# Patient Record
Sex: Male | Born: 1945 | Race: White | Hispanic: No | Marital: Single | Smoking: Never smoker
Health system: Southern US, Community
[De-identification: ages and names within clinical notes are randomized; demographics above are authoritative.]

## PROBLEM LIST (undated history)

## (undated) DIAGNOSIS — F209 Schizophrenia, unspecified: Secondary | ICD-10-CM

## (undated) DIAGNOSIS — I10 Essential (primary) hypertension: Secondary | ICD-10-CM

## (undated) DIAGNOSIS — H269 Unspecified cataract: Secondary | ICD-10-CM

## (undated) DIAGNOSIS — N400 Enlarged prostate without lower urinary tract symptoms: Secondary | ICD-10-CM

## (undated) DIAGNOSIS — E079 Disorder of thyroid, unspecified: Secondary | ICD-10-CM

## (undated) DIAGNOSIS — R32 Unspecified urinary incontinence: Secondary | ICD-10-CM

## (undated) DIAGNOSIS — G1221 Amyotrophic lateral sclerosis: Principal | ICD-10-CM

## (undated) DIAGNOSIS — D649 Anemia, unspecified: Secondary | ICD-10-CM

## (undated) DIAGNOSIS — F79 Unspecified intellectual disabilities: Secondary | ICD-10-CM

## (undated) HISTORY — PX: PROSTATE SURGERY: SHX751

## (undated) HISTORY — DX: Unspecified urinary incontinence: R32

## (undated) HISTORY — DX: Anemia, unspecified: D64.9

## (undated) HISTORY — DX: Unspecified cataract: H26.9

## (undated) HISTORY — DX: Amyotrophic lateral sclerosis: G12.21

---

## 2011-08-09 DIAGNOSIS — I10 Essential (primary) hypertension: Secondary | ICD-10-CM | POA: Diagnosis not present

## 2011-08-09 DIAGNOSIS — E119 Type 2 diabetes mellitus without complications: Secondary | ICD-10-CM | POA: Diagnosis not present

## 2011-10-07 DIAGNOSIS — H259 Unspecified age-related cataract: Secondary | ICD-10-CM | POA: Diagnosis not present

## 2011-10-07 DIAGNOSIS — E119 Type 2 diabetes mellitus without complications: Secondary | ICD-10-CM | POA: Diagnosis not present

## 2011-11-11 DIAGNOSIS — R339 Retention of urine, unspecified: Secondary | ICD-10-CM | POA: Diagnosis not present

## 2011-11-11 DIAGNOSIS — E785 Hyperlipidemia, unspecified: Secondary | ICD-10-CM | POA: Diagnosis not present

## 2011-11-11 DIAGNOSIS — I10 Essential (primary) hypertension: Secondary | ICD-10-CM | POA: Diagnosis not present

## 2011-11-11 DIAGNOSIS — F79 Unspecified intellectual disabilities: Secondary | ICD-10-CM | POA: Diagnosis not present

## 2011-11-11 DIAGNOSIS — E119 Type 2 diabetes mellitus without complications: Secondary | ICD-10-CM | POA: Diagnosis not present

## 2011-11-11 DIAGNOSIS — N390 Urinary tract infection, site not specified: Secondary | ICD-10-CM | POA: Diagnosis not present

## 2011-12-08 DIAGNOSIS — L218 Other seborrheic dermatitis: Secondary | ICD-10-CM | POA: Diagnosis not present

## 2011-12-08 DIAGNOSIS — F060 Psychotic disorder with hallucinations due to known physiological condition: Secondary | ICD-10-CM | POA: Diagnosis not present

## 2012-02-09 DIAGNOSIS — I10 Essential (primary) hypertension: Secondary | ICD-10-CM | POA: Diagnosis not present

## 2012-02-09 DIAGNOSIS — N401 Enlarged prostate with lower urinary tract symptoms: Secondary | ICD-10-CM | POA: Diagnosis not present

## 2012-02-09 DIAGNOSIS — R339 Retention of urine, unspecified: Secondary | ICD-10-CM | POA: Diagnosis not present

## 2012-02-09 DIAGNOSIS — L218 Other seborrheic dermatitis: Secondary | ICD-10-CM | POA: Diagnosis not present

## 2012-02-09 DIAGNOSIS — E785 Hyperlipidemia, unspecified: Secondary | ICD-10-CM | POA: Diagnosis not present

## 2012-02-09 DIAGNOSIS — E119 Type 2 diabetes mellitus without complications: Secondary | ICD-10-CM | POA: Diagnosis not present

## 2012-03-09 DIAGNOSIS — F639 Impulse disorder, unspecified: Secondary | ICD-10-CM | POA: Diagnosis not present

## 2012-04-10 ENCOUNTER — Ambulatory Visit (INDEPENDENT_AMBULATORY_CARE_PROVIDER_SITE_OTHER): Payer: Medicare PPO | Admitting: Family

## 2012-04-10 ENCOUNTER — Encounter: Payer: Self-pay | Admitting: Family

## 2012-04-10 VITALS — BP 128/70 | HR 77 | Temp 98.7°F | Resp 16 | Ht 68.25 in | Wt 221.0 lb

## 2012-04-10 DIAGNOSIS — Z23 Encounter for immunization: Secondary | ICD-10-CM | POA: Diagnosis not present

## 2012-04-10 DIAGNOSIS — N400 Enlarged prostate without lower urinary tract symptoms: Secondary | ICD-10-CM | POA: Diagnosis not present

## 2012-04-10 DIAGNOSIS — L309 Dermatitis, unspecified: Secondary | ICD-10-CM

## 2012-04-10 DIAGNOSIS — L259 Unspecified contact dermatitis, unspecified cause: Secondary | ICD-10-CM

## 2012-04-10 DIAGNOSIS — F209 Schizophrenia, unspecified: Secondary | ICD-10-CM | POA: Insufficient documentation

## 2012-04-10 DIAGNOSIS — I10 Essential (primary) hypertension: Secondary | ICD-10-CM

## 2012-04-10 DIAGNOSIS — E119 Type 2 diabetes mellitus without complications: Secondary | ICD-10-CM | POA: Insufficient documentation

## 2012-04-10 DIAGNOSIS — B359 Dermatophytosis, unspecified: Secondary | ICD-10-CM | POA: Diagnosis not present

## 2012-04-10 DIAGNOSIS — E785 Hyperlipidemia, unspecified: Secondary | ICD-10-CM | POA: Insufficient documentation

## 2012-04-10 NOTE — Assessment & Plan Note (Signed)
Stable at present. Family is requesting referral to dermatology.

## 2012-04-10 NOTE — Assessment & Plan Note (Signed)
Plan flp next visit.   

## 2012-04-10 NOTE — Progress Notes (Signed)
Subjective:    Patient ID: Edward [name], male    DOB: 1945-09-24, 66 y.o.   MRN: [id number]  HPI  Mr.  Edward [name] is a 66 yr old male who presents today to establish care. Recently moved here from [place], [place]. Presents with his sister who is legal Guardian Edward [name].   His medical history is significant for the following:  1) Diabetes type 2- Per sister this has been well controlled.  He takes glyburide.  No symptomatic hypoglycemia.    2) Mild mental retardation-  3) Schizophrenia- he was following with [facility] for monitoring.  He will be meeting with [facility] for therapy.  Needs referral to Psych.  4) hyperlipidemia- diet controlled.  Not on meds.    5) HTN-  He denies swelling.    6) Eczema-dandruff type symptoms.    7) BPH-  Needs referral to Urology  Review of Systems  Constitutional:       He has had some mild weight loss.  3 pound weight loss since July.   HENT: Negative for hearing loss and congestion.   Eyes: Negative for visual disturbance.  Respiratory: Negative for cough.   Cardiovascular: Negative for leg swelling.  Gastrointestinal: Negative for nausea, vomiting, diarrhea and constipation.  Genitourinary:       Some urinary incontinence.  Since his prostate procedure  Musculoskeletal: Negative for back pain and arthralgias.  Skin: Negative for rash.  Neurological: Negative for headaches.  Hematological: Does not bruise/bleed easily.  Psychiatric/Behavioral:       Occasional impatience, no aggression    Past Medical History  Diagnosis Date  . Diabetes mellitus   . Urinary incontinence     History   Social History  . Marital Status: Single    Spouse Name: N/A    Number of Children: N/A  . Years of Education: N/A   Occupational History  . Not on file.   Social History Main Topics  . Smoking status: Never Smoker   . Smokeless tobacco: Current User   Comment: chews tobacco  . Alcohol Use: No  . Drug Use: Not on file  .  Sexually Active: Not on file   Other Topics Concern  . Not on file   Social History Narrative   Lives in a group homeSister Edward [name] is his legal guardian.Sister and her family live locally    History reviewed. No pertinent past surgical history.  Family History  Problem Relation Age of Onset  . Arthritis Mother   . Arrhythmia Mother   . Lymphoma Mother     non-hodgkins  . Heart disease Father   . Stroke Father     No Known Allergies  Current Outpatient Prescriptions on File Prior to Visit  Medication Sig Dispense Refill  . FLUoxetine (PROZAC) 20 MG capsule Take 20 mg by mouth daily.      Marland Kitchen glyBURIDE (DIABETA) 5 MG tablet Take 5 mg by mouth 2 (two) times daily with a meal.      . QUEtiapine (SEROQUEL) 50 MG tablet Take 50 mg by mouth at bedtime.      Marland Kitchen thioridazine (MELLARIL) 100 MG tablet Take 200 mg by mouth at bedtime.      . topiramate (TOPAMAX) 25 MG tablet Take 25 mg by mouth 2 (two) times daily.        BP 128/70  Pulse 77  Temp 98.7 F (37.1 C) (Oral)  Resp 16  Ht 5' 8.25" (1.734 m)  Wt 221 lb (100.245 kg)  BMI 33.36 kg/m2  SpO2 99%       Objective:   Physical Exam  Constitutional:       Obese white male, NAD  HENT:  Head: Normocephalic and atraumatic.  Cardiovascular: Normal rate and regular rhythm.   No murmur heard. Pulmonary/Chest: Effort normal and breath sounds normal. No respiratory distress. He has no wheezes. He has no rales. He exhibits no tenderness.  Abdominal: Soft. Bowel sounds are normal. He exhibits no distension. There is no tenderness.  Musculoskeletal: He exhibits no edema.       See diabetic foot exam.   Skin: Skin is warm and dry.       Annular lesion right dorsal foot.  Red border.    Psychiatric:       Verbal at times,  General smiles and shakes head yes to most questions.              Assessment & Plan:

## 2012-04-10 NOTE — Assessment & Plan Note (Signed)
Recommended to group home leader that they apply lotrimin bid to lesion right foot.

## 2012-04-10 NOTE — Assessment & Plan Note (Signed)
Seems well controlled on meds.  Will arrange apt with psychiatry for ongoing medication management.

## 2012-04-10 NOTE — Assessment & Plan Note (Signed)
Sister says that he had blood work drawn in July. Will try to obtain.  Flu and pneumovax given today. Clinically well controlled on glyburide.

## 2012-04-10 NOTE — Patient Instructions (Addendum)
Please return fasting after 10/17 for a medicare wellness visit.  Welcome to [facility]!

## 2012-04-10 NOTE — Assessment & Plan Note (Signed)
BP is well controlled, not on meds.  Monitor.

## 2012-04-12 ENCOUNTER — Ambulatory Visit (HOSPITAL_COMMUNITY): Payer: Medicare PPO | Admitting: Psychiatry

## 2012-04-13 DIAGNOSIS — L2089 Other atopic dermatitis: Secondary | ICD-10-CM | POA: Diagnosis not present

## 2012-04-18 ENCOUNTER — Telehealth: Payer: Self-pay | Admitting: Family

## 2012-04-18 NOTE — Telephone Encounter (Signed)
Received medical records from [facility]  P: [phone] F: [phone]

## 2012-05-18 DIAGNOSIS — N400 Enlarged prostate without lower urinary tract symptoms: Secondary | ICD-10-CM | POA: Diagnosis not present

## 2012-05-18 DIAGNOSIS — R32 Unspecified urinary incontinence: Secondary | ICD-10-CM | POA: Diagnosis not present

## 2012-05-19 ENCOUNTER — Encounter: Payer: Self-pay | Admitting: Family

## 2012-05-19 ENCOUNTER — Ambulatory Visit (INDEPENDENT_AMBULATORY_CARE_PROVIDER_SITE_OTHER): Payer: Medicare Other | Admitting: Family

## 2012-05-19 VITALS — BP 100/60 | HR 71 | Temp 98.7°F | Resp 18 | Ht 68.25 in | Wt 218.0 lb

## 2012-05-19 DIAGNOSIS — R634 Abnormal weight loss: Secondary | ICD-10-CM

## 2012-05-19 DIAGNOSIS — Z111 Encounter for screening for respiratory tuberculosis: Secondary | ICD-10-CM | POA: Diagnosis not present

## 2012-05-19 DIAGNOSIS — I10 Essential (primary) hypertension: Secondary | ICD-10-CM | POA: Diagnosis not present

## 2012-05-19 DIAGNOSIS — Z9189 Other specified personal risk factors, not elsewhere classified: Secondary | ICD-10-CM

## 2012-05-19 DIAGNOSIS — Z0000 Encounter for general adult medical examination without abnormal findings: Secondary | ICD-10-CM

## 2012-05-19 DIAGNOSIS — N400 Enlarged prostate without lower urinary tract symptoms: Secondary | ICD-10-CM

## 2012-05-19 DIAGNOSIS — E785 Hyperlipidemia, unspecified: Secondary | ICD-10-CM

## 2012-05-19 DIAGNOSIS — Z789 Other specified health status: Secondary | ICD-10-CM

## 2012-05-19 DIAGNOSIS — E119 Type 2 diabetes mellitus without complications: Secondary | ICD-10-CM

## 2012-05-19 DIAGNOSIS — IMO0001 Reserved for inherently not codable concepts without codable children: Secondary | ICD-10-CM

## 2012-05-19 DIAGNOSIS — F209 Schizophrenia, unspecified: Secondary | ICD-10-CM

## 2012-05-19 DIAGNOSIS — R350 Frequency of micturition: Secondary | ICD-10-CM | POA: Diagnosis not present

## 2012-05-19 LAB — HEMOGLOBIN A1C
Hgb A1c MFr Bld: 5.4 % (ref <5.7)
Mean Plasma Glucose: 108 mg/dL (ref <117)

## 2012-05-19 LAB — BASIC METABOLIC PANEL
BUN: 15 mg/dL (ref 6–23)
Calcium: 9.4 mg/dL (ref 8.4–10.5)
Glucose, Bld: 89 mg/dL (ref 70–99)
Potassium: 4.4 mEq/L (ref 3.5–5.3)
Sodium: 138 mEq/L (ref 135–145)

## 2012-05-19 LAB — HEPATIC FUNCTION PANEL
ALT: 27 U/L (ref 0–53)
AST: 25 U/L (ref 0–37)
Bilirubin, Direct: 0.1 mg/dL (ref 0.0–0.3)
Indirect Bilirubin: 0.4 mg/dL (ref 0.0–0.9)
Total Protein: 7.4 g/dL (ref 6.0–8.3)

## 2012-05-19 NOTE — Progress Notes (Signed)
Subjective:    Patient ID: Edward [name], male    DOB: [birth year], 66 y.o.   MRN: [id number]  HPI  Subjective:   Patient here for Medicare annual wellness visit and management of other chronic and acute problems.  Pt presents today with his sister and his caregiver from his group home.  HTN- Not on BP meds.  Hyperlipidemia- he is fasting today.  Schizophrenia- he is maintained on fluoxetine, mellaril, topamax. Referral made last visit to psychiatry. Has apt scheduled at behavioral health on 11/6.  DM2- The pt is maintained on glyburide. Reports stable sugars.  Preventative:  Immunizations: Had zostavax, last tetanus 2010 Diet: healthy diet.   Exercise: goes to a day program- walking/cooking etc. Colonoscopy: sister declines colo and declines stool study.  She declines UA as he had one done yesterday at urology. Dexa: has never had.    Risk factors: DM/hyperlipidemia  Roster of Physicians Providing Medical Care to Patient: Activities of Daily Living  In your present state of health, do you have any difficulty performing the following activities? Preparing food and eating?: Yes Bathing yourself: yes Getting dressed: yes Using the toilet:No  Moving around from place to place: No  In the past year have you fallen or had a near fall?:No    Home Safety: Has smoke detector and wears seat belts. No firearms. No excess sun exposure.  Diet and Exercise  Current exercise habits: active with his group home Dietary issues discussed: healthy diet   Depression Screen  (Note: if answer to either of the following is "Yes", then a more complete depression screening is indicated)  Q1: Over the past two weeks, have you felt down, depressed or hopeless?no  Q2: Over the past two weeks, have you felt little interest or pleasure in doing things? no   The following portions of the patient's history were reviewed and updated as appropriate: allergies, current medications, past family  history, past medical history, past social history, past surgical history and problem list.   Objective:   Vision: see nursing Hearing: able to hear forced whisper at 6 feet Body mass index:see nursing Cognitive Impairment Assessment: cognition, memory and judgment appear normal.   Assessment:      Plan:    During the course of the visit the patient/caregivers were educated and counseled about appropriate screening and preventive services including:       Fall prevention   Bone densitometry screening  Nutrition counseling  PPD placed today  Vaccines / LABS  Patient Instructions (the written plan) was given to the patient/patient's sister.  Form to be filled for the group home pending PPD and lab results.       Review of Systems     Objective:   Physical Exam  Physical Exam  Constitutional: Pleasant elderly male.  Cognition appears limited by MR. He appears well-developed and well-nourished. No distress.  HENT:  Head: Normocephalic and atraumatic.  Right Ear: Tympanic membrane and ear canal normal.  Left Ear: Tympanic membrane and ear canal normal.  Mouth/Throat: Oropharynx is clear and moist.  Eyes: Pupils are equal, round, and reactive to light. No scleral icterus.  Neck: Normal range of motion. No thyromegaly present.  Cardiovascular: Normal rate and regular rhythm.   No murmur heard. Pulmonary/Chest: Effort normal and breath sounds normal. No respiratory distress. He has no wheezes. He has no rales. He exhibits no tenderness.  Abdominal: Soft. Bowel sounds are normal. He exhibits no distension and no mass. There is no tenderness. There  is no rebound and no guarding.  Musculoskeletal: He exhibits no edema.  Lymphadenopathy:    He has no cervical adenopathy.  Neurological: He is alert. He has normal reflexes. He exhibits normal muscle tone. Coordination normal.  Skin: Skin is warm and dry.  Psychiatric: He has a normal mood and affect. His behavior is normal.  Judgment and thought content normal.          Assessment & Plan:         Assessment & Plan:

## 2012-05-19 NOTE — Patient Instructions (Signed)
Please complete blood work prior to leaving. Follow up on Monday to have PPD read.  Follow up in 3 months for routine appointment.

## 2012-05-20 LAB — CBC WITH DIFFERENTIAL/PLATELET
Hemoglobin: 15.5 g/dL (ref 13.0–17.0)
Lymphocytes Relative: 20 % (ref 12–46)
Lymphs Abs: 1.4 10*3/uL (ref 0.7–4.0)
MCV: 95.7 fL (ref 78.0–100.0)
Monocytes Relative: 6 % (ref 3–12)
Neutrophils Relative %: 57 % (ref 43–77)
Platelets: 250 10*3/uL (ref 150–400)
RBC: 4.64 MIL/uL (ref 4.22–5.81)
WBC: 7.1 10*3/uL (ref 4.0–10.5)

## 2012-05-23 ENCOUNTER — Telehealth: Payer: Self-pay | Admitting: Family

## 2012-05-23 ENCOUNTER — Encounter: Payer: Self-pay | Admitting: Family

## 2012-05-23 NOTE — Telephone Encounter (Signed)
Spoke with medical records at [facility] and requested most recent U/A. Result will be faxed.

## 2012-05-23 NOTE — Telephone Encounter (Signed)
Pls call sister and let her know that pt's sugar appears well controlled. Kidney function, liver function, blood count, electrolytes all ok.  We will need PPD completed and still waiting on UA from [facility] before we can sign off on his paperwork.

## 2012-05-24 ENCOUNTER — Ambulatory Visit: Payer: Medicare PPO

## 2012-05-24 NOTE — Telephone Encounter (Signed)
Spoke with pt's sister, she was unaware that pt was not brought back to [place] on Monday for PPD reading. Test will need to be repeated. She asked if his current facility could accept a previous TB skin test and I advised her to discuss this with his current facility. She will speak with them and have them contact us.

## 2012-05-25 ENCOUNTER — Telehealth: Payer: Self-pay | Admitting: Family

## 2012-05-25 MED ORDER — GLYBURIDE 5 MG PO TABS: 2.5000 mg | ORAL_TABLET | Freq: Two times a day (BID) | ORAL | Status: DC

## 2012-05-25 NOTE — Assessment & Plan Note (Signed)
A1C is 5.4.  Will decrease glyburide from 5mg  to 2.5 mg bid.

## 2012-05-25 NOTE — Assessment & Plan Note (Signed)
Clinically stable. 

## 2012-05-25 NOTE — Assessment & Plan Note (Signed)
BP Readings from Last 3 Encounters:  05/19/12 100/60  04/10/12 128/70   BP stable- not on meds.  I do not think that his BP could tolerate addition of ACE.  Monitor.

## 2012-05-25 NOTE — Telephone Encounter (Signed)
Notified pt's caretaker and sister of medication change. Rx printed and placed at front desk for pick up along with copy of orders below.

## 2012-05-25 NOTE — Assessment & Plan Note (Addendum)
Obtain flp  

## 2012-05-25 NOTE — Assessment & Plan Note (Signed)
Clinically stable. He is scheduled to establish with psychiatry for ongoing med management.

## 2012-05-25 NOTE — Telephone Encounter (Signed)
Please let sister know that I have reviewed Edward [name]'s medications and I would like to decrease his glyburide.  Glucose is very well controlled and I am afraid that he might start having dangerous lows.  I suspect that his recent increased activity at the home is keeping his sugars down.   Decrease glyburide 5mg  toe 1/2 tab PO ( 2.5mg ) BID.

## 2012-05-29 ENCOUNTER — Ambulatory Visit (INDEPENDENT_AMBULATORY_CARE_PROVIDER_SITE_OTHER): Payer: Medicare Other | Admitting: Family

## 2012-05-29 DIAGNOSIS — Z111 Encounter for screening for respiratory tuberculosis: Secondary | ICD-10-CM

## 2012-05-30 ENCOUNTER — Other Ambulatory Visit: Payer: Medicare PPO

## 2012-05-31 ENCOUNTER — Ambulatory Visit (INDEPENDENT_AMBULATORY_CARE_PROVIDER_SITE_OTHER): Payer: Medicare Other | Admitting: Psychiatry

## 2012-05-31 ENCOUNTER — Ambulatory Visit (INDEPENDENT_AMBULATORY_CARE_PROVIDER_SITE_OTHER): Payer: Medicare Other | Admitting: Family

## 2012-05-31 ENCOUNTER — Ambulatory Visit (INDEPENDENT_AMBULATORY_CARE_PROVIDER_SITE_OTHER)
Admission: RE | Admit: 2012-05-31 | Discharge: 2012-05-31 | Disposition: A | Discharge status: Home / Self Care | Payer: Medicaid Other | Source: Ambulatory Visit

## 2012-05-31 DIAGNOSIS — F259 Schizoaffective disorder, unspecified: Secondary | ICD-10-CM

## 2012-05-31 DIAGNOSIS — Z9189 Other specified personal risk factors, not elsewhere classified: Secondary | ICD-10-CM

## 2012-05-31 DIAGNOSIS — Z789 Other specified health status: Secondary | ICD-10-CM

## 2012-05-31 DIAGNOSIS — Z111 Encounter for screening for respiratory tuberculosis: Secondary | ICD-10-CM

## 2012-05-31 LAB — TB SKIN TEST: TB Skin Test: NEGATIVE
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
## 2012-05-31 NOTE — Progress Notes (Signed)
  Subjective:    Patient ID: Edward [name], male    DOB: [birth year], 66 y.o.   MRN: [id number]  HPI  The patient presented to the office for assessment of PPD placed on 05/29/12.   Review of Systems     Objective:   Physical Exam  No redness or induration noted, only slight bruising at injection site.       Assessment & Plan:   Negative PPD skin test.

## 2012-06-02 ENCOUNTER — Telehealth: Payer: Self-pay | Admitting: *Deleted

## 2012-06-02 NOTE — Telephone Encounter (Signed)
Received message back from caretaker that they will pick up form Monday or Tuesday. Form copied and original placed at front desk. Copy sent for scanning.

## 2012-06-02 NOTE — Telephone Encounter (Signed)
Annual physical form completed and signed. Left message on caretaker's # to return my and let us know if form will be picked up by them or leave a fax #.

## 2012-06-05 DIAGNOSIS — R109 Unspecified abdominal pain: Secondary | ICD-10-CM | POA: Diagnosis not present

## 2012-06-05 DIAGNOSIS — N390 Urinary tract infection, site not specified: Secondary | ICD-10-CM | POA: Diagnosis not present

## 2012-06-14 ENCOUNTER — Ambulatory Visit (INDEPENDENT_AMBULATORY_CARE_PROVIDER_SITE_OTHER): Payer: Medicare Other | Admitting: Family

## 2012-06-14 VITALS — BP 128/64 | HR 84 | Temp 97.5°F | Resp 16 | Ht 68.25 in | Wt 220.0 lb

## 2012-06-14 DIAGNOSIS — N390 Urinary tract infection, site not specified: Secondary | ICD-10-CM | POA: Diagnosis not present

## 2012-06-14 NOTE — Progress Notes (Signed)
Subjective:    Patient ID: Edward [name], male    DOB: [birth year], 66 y.o.   MRN: [id number]  HPI  [name] is a 66 yr old male who presents today for follow up from a visit to urgent care 11/11.  He was diagnosed with UTI at that time and treated with cipro.  He presents with the aid from his home today.  He denies dysuria and aid denies recent fever.   He is scheduled to see the urologist for follow up next week.   Review of Systems see HPI    Past Medical History  Diagnosis Date  . Diabetes mellitus   . Urinary incontinence     History   Social History  . Marital Status: Single    Spouse Name: N/A    Number of Children: N/A  . Years of Education: N/A   Occupational History  . Not on file.   Social History Main Topics  . Smoking status: Never Smoker   . Smokeless tobacco: Current User     Comment: chews tobacco  . Alcohol Use: No  . Drug Use: Not on file  . Sexually Active: Not on file   Other Topics Concern  . Not on file   Social History Narrative   Lives in a group homeSister Creed Copper is his legal guardian.Sister and her family live locally    Past Surgical History  Procedure Date  . Prostate surgery     x2, ?procedure    Family History  Problem Relation Age of Onset  . Arthritis Mother   . Arrhythmia Mother   . Lymphoma Mother     non-hodgkins  . Heart disease Father   . Stroke Father     No Known Allergies  Current Outpatient Prescriptions on File Prior to Visit  Medication Sig Dispense Refill  . aspirin 81 MG tablet Take 81 mg by mouth daily.      Marland Kitchen desonide (DESOWEN) 0.05 % lotion Apply 1 application topically 3 (three) times a week.      . dutasteride (AVODART) 0.5 MG capsule Take 0.5 mg by mouth daily. Do Not Crush      . FLUoxetine (PROZAC) 20 MG capsule Take 20 mg by mouth daily.      Marland Kitchen glyBURIDE (DIABETA) 5 MG tablet Take 0.5 tablets (2.5 mg total) by mouth 2 (two) times daily with a meal.  30 tablet  3  . hydrocortisone valerate  cream (WESTCORT) 0.2 % Apply 1 application topically 2 (two) times daily.      Marland Kitchen ketoconazole (NIZORAL) 2 % cream Apply 1 application topically 2 (two) times daily. For face      . QUEtiapine (SEROQUEL) 50 MG tablet Take 100 mg by mouth at bedtime.       . silodosin (RAPAFLO) 8 MG CAPS capsule Take 8 mg by mouth daily with breakfast.      . thioridazine (MELLARIL) 100 MG tablet Take 200 mg by mouth at bedtime.      Marland Kitchen thioridazine (MELLARIL) 50 MG tablet Take 50 mg by mouth every morning.      . topiramate (TOPAMAX) 25 MG tablet Take 25 mg by mouth 2 (two) times daily.        BP 128/64  Pulse 84  Temp 97.5 F (36.4 C) (Oral)  Resp 16  Ht 5' 8.25" (1.734 m)  Wt 220 lb (99.791 kg)  BMI 33.21 kg/m2  SpO2 98%    Objective:   Physical Exam  Constitutional: He  appears well-developed and well-nourished. No distress.  Cardiovascular: Normal rate and regular rhythm.   No murmur heard. Pulmonary/Chest: Effort normal and breath sounds normal. No respiratory distress. He has no wheezes. He has no rales. He exhibits no tenderness.  Psychiatric: He has a normal mood and affect.          Assessment & Plan:

## 2012-06-14 NOTE — Assessment & Plan Note (Signed)
Completed antibiotics.  Clinically resolved.  Pt to keep upcoming apt with urology.

## 2012-06-14 NOTE — Patient Instructions (Addendum)
Please keep upcoming appointment with Dr. Brunilda Payor.

## 2012-06-15 DIAGNOSIS — R32 Unspecified urinary incontinence: Secondary | ICD-10-CM | POA: Diagnosis not present

## 2012-06-15 DIAGNOSIS — N400 Enlarged prostate without lower urinary tract symptoms: Secondary | ICD-10-CM | POA: Diagnosis not present

## 2012-06-20 ENCOUNTER — Telehealth: Payer: Self-pay | Admitting: *Deleted

## 2012-06-20 NOTE — Telephone Encounter (Signed)
Received message from Cicero Duck at [facility] on 06/19/12 stating pt did not receive his morning doses of Glyburide and Topamax last week but did receive his evening doses.  Pt has been started back on both doses and she wanted to know if there was anything else they needed to do? Per verbal from Provider, continue medications as prescribed. Left detailed message on voicemail on 06/19/12 @ 5:30pm and to call if any questions.

## 2012-07-21 ENCOUNTER — Ambulatory Visit (INDEPENDENT_AMBULATORY_CARE_PROVIDER_SITE_OTHER): Payer: Medicare Other | Admitting: Psychiatry

## 2012-07-21 DIAGNOSIS — F259 Schizoaffective disorder, unspecified: Secondary | ICD-10-CM | POA: Diagnosis not present

## 2012-07-21 MED ORDER — TOPIRAMATE 25 MG PO TABS: 25.0000 mg | ORAL_TABLET | Freq: Two times a day (BID) | ORAL | Status: DC

## 2012-07-21 MED ORDER — QUETIAPINE FUMARATE 50 MG PO TABS: 100.0000 mg | ORAL_TABLET | Freq: Every day | ORAL | Status: DC

## 2012-07-21 MED ORDER — THIORIDAZINE HCL 100 MG PO TABS: 200.0000 mg | ORAL_TABLET | Freq: Every day | ORAL | Status: DC

## 2012-07-21 MED ORDER — THIORIDAZINE HCL 50 MG PO TABS: 50.0000 mg | ORAL_TABLET | ORAL | Status: AC

## 2012-07-21 NOTE — Progress Notes (Signed)
[facility] MD Progress Note  07/21/2012 10:22 AM Edward [name]  MRN:  [id number] Subjective: Follow up for agitation Diagnosis:  Axis I: Schizoaffective Disorder Today the patient was seen with his guardian who is his sister name Edward [name] and Edward [name] who's the operation manager of the facility this patient lives in. This patient is doing very well. When I first met him he did been at the facility only for a few weeks. Now he is very well-adjusted. He is not depressed nor is he anxious. He's not physically agitated. He is making no attempt to elope. He's doing most of his basic ADLs one is on. His chronic problems with incontinence which apparently is a urological condition. The patient is not overly sedated. On his last visit we changed to Seroquel taking it all at night to help with sleep and in fact it worked. He is now sleeping through the night eating well and has good energy. The patient goes to day treatment program in the morning until about 1:00. Then he's with 5 other members of his household and does well. The patient's guardian his sister is very pleased with the environment that he is living in at this time.today the patient had an aims test that showed no evidence of tardive dyskinesia. Patient is followed closely in the medical clinic treated for non-insulin-dependent diabetes.today we reviewed all the pros and cons of the medication she's taking. The patient seemed to understand this but more importantly is his guardian and the operation managers understood the effects of the medicines and potential side effects. All were in agreement to continue these agents at the present doses. ADL's:  Intact  Sleep: Good  Appetite:  Good  Suicidal Ideation:  no Homicidal Ideation:  none AEB (as evidenced by):  Psychiatric Specialty Exam: ROS  There were no vitals taken for this visit.There is no height or weight on file to calculate BMI.  General Appearance: Casual  Eye Contact::  Absent    Speech:  Slow  Volume:  Normal  Mood:  Euthymic  Affect:  Blunt  Thought Process:  Coherent  Orientation:  Full (Time, Place, and Person)  Thought Content:  Negative  Suicidal Thoughts:  No  Homicidal Thoughts:  No  Memory:    Judgement:  Fair  Insight:  Fair  Psychomotor Activity:  Normal  Concentration:  Good  Recall:  Good  Akathisia:  Negative  Handed:  Right  AIMS (if indicated):     Assets:  Communication Skills  Sleep:      Current Medications: Current Outpatient Prescriptions  Medication Sig Dispense Refill  . aspirin 81 MG tablet Take 81 mg by mouth daily.      . ciprofloxacin (CIPRO) 500 MG tablet Take 500 mg by mouth 2 (two) times daily. For 10 days, started on 11/11      . desonide (DESOWEN) 0.05 % lotion Apply 1 application topically 3 (three) times a week.      . dutasteride (AVODART) 0.5 MG capsule Take 0.5 mg by mouth daily. Do Not Crush      . FLUoxetine (PROZAC) 20 MG capsule Take 20 mg by mouth daily.      Marland Kitchen glyBURIDE (DIABETA) 5 MG tablet Take 0.5 tablets (2.5 mg total) by mouth 2 (two) times daily with a meal.  30 tablet  3  . hydrocortisone valerate cream (WESTCORT) 0.2 % Apply 1 application topically 2 (two) times daily.      Marland Kitchen ketoconazole (NIZORAL) 2 % cream Apply 1 application  topically 2 (two) times daily. For face      . QUEtiapine (SEROQUEL) 50 MG tablet Take 100 mg by mouth at bedtime.       . silodosin (RAPAFLO) 8 MG CAPS capsule Take 8 mg by mouth daily with breakfast.      . thioridazine (MELLARIL) 100 MG tablet Take 200 mg by mouth at bedtime.      Marland Kitchen thioridazine (MELLARIL) 50 MG tablet Take 50 mg by mouth every morning.      . topiramate (TOPAMAX) 25 MG tablet Take 25 mg by mouth 2 (two) times daily.        Lab Results: No results found for this or any previous visit (from the past 48 hour(s)).  Physical Findings: AIMS:  , ,  ,  ,    CIWA:    COWS:     Treatment Plan Summary: At this time we shall continue all the medications that he  is on. This includes Mellaril 50 in the morning 200 at night and Seroquel 50 mg 2 at night. He will continue on his Prozac 20 mg. At this time we'll make an effort to get all his blood work that was drawn as well as review his past EKG. This patient to return to see me in 4 months.  Plan:  Medical Decision Making Problem Points:  Established problem, stable/improving (1) and Established problem, worsening (2) Data Points:  Review of medication regiment & side effects (2)  I certify that inpatient services furnished can reasonably be expected to improve the patient's condition.   Kwame Ryland IRVING 07/21/2012, 10:22 AM

## 2012-08-22 ENCOUNTER — Ambulatory Visit: Payer: Self-pay | Admitting: Family

## 2012-08-23 ENCOUNTER — Telehealth: Payer: Self-pay | Admitting: *Deleted

## 2012-08-23 NOTE — Telephone Encounter (Signed)
Received fax from [facility] that pt's plan requires prior auth to obtain Glyburide 5mg . Called [phone] and requested P.A. Form.  Awaiting form.

## 2012-08-23 NOTE — Telephone Encounter (Signed)
Reference# for P.A. Request is [id number].

## 2012-08-24 ENCOUNTER — Ambulatory Visit (INDEPENDENT_AMBULATORY_CARE_PROVIDER_SITE_OTHER): Payer: Medicare Other | Admitting: Family

## 2012-08-24 ENCOUNTER — Encounter: Payer: Self-pay | Admitting: Family

## 2012-08-24 ENCOUNTER — Other Ambulatory Visit: Payer: Self-pay | Admitting: Family

## 2012-08-24 VITALS — BP 130/72 | HR 84 | Temp 98.4°F | Resp 16 | Ht 68.25 in | Wt 210.0 lb

## 2012-08-24 DIAGNOSIS — I10 Essential (primary) hypertension: Secondary | ICD-10-CM | POA: Diagnosis not present

## 2012-08-24 DIAGNOSIS — E119 Type 2 diabetes mellitus without complications: Secondary | ICD-10-CM | POA: Diagnosis not present

## 2012-08-24 DIAGNOSIS — F209 Schizophrenia, unspecified: Secondary | ICD-10-CM

## 2012-08-24 DIAGNOSIS — E785 Hyperlipidemia, unspecified: Secondary | ICD-10-CM | POA: Diagnosis not present

## 2012-08-24 NOTE — Progress Notes (Signed)
Subjective:    Patient ID: Edward [name], male    DOB: 11/22/1945, 67 y.o.   MRN: [id number]  HPI  [name] is a 67 yr old male who presents today for follow up.  HTN- Not on BP meds.   Hyperlipidemia- he is not fasting today.   Schizophrenia- he is maintained on fluoxetine, mellaril, topamax.   DM2- The pt is maintained on glyburide. Reports stable sugars.    Review of Systems See HPI  Past Medical History  Diagnosis Date  . Diabetes mellitus   . Urinary incontinence     History   Social History  . Marital Status: Single    Spouse Name: N/A    Number of Children: N/A  . Years of Education: N/A   Occupational History  . Not on file.   Social History Main Topics  . Smoking status: Never Smoker   . Smokeless tobacco: Current User     Comment: chews tobacco  . Alcohol Use: No  . Drug Use: Not on file  . Sexually Active: Not on file   Other Topics Concern  . Not on file   Social History Narrative   Lives in a group homeSister Creed Copper is his legal guardian.Sister and her family live locally    Past Surgical History  Procedure Date  . Prostate surgery     x2, ?procedure    Family History  Problem Relation Age of Onset  . Arthritis Mother   . Arrhythmia Mother   . Lymphoma Mother     non-hodgkins  . Heart disease Father   . Stroke Father     No Known Allergies  Current Outpatient Prescriptions on File Prior to Visit  Medication Sig Dispense Refill  . desonide (DESOWEN) 0.05 % lotion Apply 1 application topically 3 (three) times a week.      . dutasteride (AVODART) 0.5 MG capsule Take 0.5 mg by mouth daily. Do Not Crush      . FLUoxetine (PROZAC) 20 MG capsule Take 20 mg by mouth daily.      Marland Kitchen glyBURIDE (DIABETA) 5 MG tablet Take 0.5 tablets (2.5 mg total) by mouth 2 (two) times daily with a meal.  30 tablet  3  . hydrocortisone valerate cream (WESTCORT) 0.2 % Apply 1 application topically 2 (two) times daily.      Marland Kitchen ketoconazole (NIZORAL) 2  % cream Apply 1 application topically 2 (two) times daily. For face      . QUEtiapine (SEROQUEL) 50 MG tablet Take 2 tablets (100 mg total) by mouth at bedtime.  60 tablet  10  . silodosin (RAPAFLO) 8 MG CAPS capsule Take 8 mg by mouth daily with breakfast.      . thioridazine (MELLARIL) 100 MG tablet Take 2 tablets (200 mg total) by mouth at bedtime.  30 tablet  10  . thioridazine (MELLARIL) 50 MG tablet Take 1 tablet (50 mg total) by mouth every morning.  30 tablet  10  . topiramate (TOPAMAX) 25 MG tablet Take 1 tablet (25 mg total) by mouth 2 (two) times daily.  60 tablet  10  . aspirin 81 MG tablet Take 81 mg by mouth daily.        BP 130/72  Pulse 84  Temp 98.4 F (36.9 C) (Oral)  Resp 16  Ht 5' 8.25" (1.734 m)  Wt 210 lb 0.6 oz (95.274 kg)  BMI 31.70 kg/m2  SpO2 99%       Objective:   Physical Exam  Constitutional: He appears well-developed and well-nourished. No distress.  HENT:  Head: Normocephalic and atraumatic.  Cardiovascular: Normal rate and regular rhythm.   No murmur heard. Pulmonary/Chest: Effort normal and breath sounds normal. No respiratory distress. He has no wheezes. He has no rales. He exhibits no tenderness.  Musculoskeletal: He exhibits no edema.  Neurological: He is alert.          Assessment & Plan:  Recommended to care giver that he restart aspirin 81mg .

## 2012-08-24 NOTE — Telephone Encounter (Signed)
P.A. Form received and forwarded to Provider for completion / signature.

## 2012-08-24 NOTE — Patient Instructions (Addendum)
Please return fasting for lab work. Follow up in 3 months.  

## 2012-08-25 ENCOUNTER — Telehealth: Payer: Self-pay | Admitting: *Deleted

## 2012-08-25 DIAGNOSIS — E785 Hyperlipidemia, unspecified: Secondary | ICD-10-CM | POA: Diagnosis not present

## 2012-08-25 DIAGNOSIS — E119 Type 2 diabetes mellitus without complications: Secondary | ICD-10-CM | POA: Diagnosis not present

## 2012-08-25 LAB — BASIC METABOLIC PANEL
BUN: 14 mg/dL (ref 6–23)
CO2: 27 mEq/L (ref 19–32)
Chloride: 102 mEq/L (ref 96–112)
Creat: 1.22 mg/dL (ref 0.50–1.35)
Potassium: 4.3 mEq/L (ref 3.5–5.3)

## 2012-08-25 LAB — LIPID PANEL
Cholesterol: 137 mg/dL (ref 0–200)
Triglycerides: 150 mg/dL — ABNORMAL HIGH (ref <150)
VLDL: 30 mg/dL (ref 0–40)

## 2012-08-25 LAB — HEMOGLOBIN A1C: Hgb A1c MFr Bld: 5.5 % (ref <5.7)

## 2012-08-25 NOTE — Telephone Encounter (Signed)
Received call from PCR in the lab that pt is unable to provide a urine sample for micro albumin that was ordered. Attempted to cancel test but unable to cancel from system.

## 2012-08-28 ENCOUNTER — Telehealth: Payer: Self-pay | Admitting: Family

## 2012-08-28 ENCOUNTER — Encounter: Payer: Self-pay | Admitting: Family

## 2012-08-28 NOTE — Telephone Encounter (Signed)
Please notify caregiver that sugar looks great.  I think that he can stop glyburide and we will watch him off of meds for a few months.

## 2012-08-28 NOTE — Telephone Encounter (Signed)
Notified pt's sister, caregiver. She states she will call back to arrange pt's follow up in 3 months as she is going to try and come to his visits as well. Left detailed message on voicemail for [facility] caregiver to call and verify receipt of message and let us know if they will continue to monitor pt's glucose while off of medication.

## 2012-08-29 NOTE — Telephone Encounter (Signed)
Left message on caregiver's voicemail to return my call.

## 2012-08-29 NOTE — Telephone Encounter (Signed)
Caregiver Claris Gower) left message returning my call. Left detailed message to return my call.

## 2012-08-30 NOTE — Telephone Encounter (Signed)
Med has been discontinued

## 2012-08-30 NOTE — Telephone Encounter (Signed)
Received message from [employer] requesting a written order to stop pt's glyburide and continue to monitor glucose once a day as needed. She requests that Rx be faxed to [phone].  Phone note printed for signature/authorization and faxed.

## 2012-09-01 NOTE — Assessment & Plan Note (Signed)
Not fasting today, plan to check FLP next visit.

## 2012-09-01 NOTE — Assessment & Plan Note (Signed)
Currently stable on current meds. Continue same.  

## 2012-09-01 NOTE — Assessment & Plan Note (Addendum)
BP Readings from Last 3 Encounters:  08/24/12 130/72  06/14/12 128/64  05/19/12 100/60   BP currently stable.  Not on ACE. Plan check urine microalbumin next visit. Consider adding ace at that time.

## 2012-09-01 NOTE — Assessment & Plan Note (Signed)
A1C 5.5.  D/C glyburide. Monitor.

## 2012-09-20 ENCOUNTER — Telehealth: Payer: Self-pay | Admitting: *Deleted

## 2012-09-20 NOTE — Telephone Encounter (Signed)
Received PA requests from [facility] for pt's rapaflo and glyburide. Rapaflo PA was faxed to urology office. Glyburide was discontinued and pt should not be taking it currently. Verified with caregiver Claris Gower) that medication has been discontinued and she will contact their pharmacy to make sure they are aware.

## 2012-10-03 DIAGNOSIS — N390 Urinary tract infection, site not specified: Secondary | ICD-10-CM | POA: Diagnosis not present

## 2012-10-04 ENCOUNTER — Ambulatory Visit: Payer: Self-pay | Admitting: Family

## 2012-10-10 ENCOUNTER — Ambulatory Visit: Payer: Self-pay | Admitting: Family

## 2012-10-13 ENCOUNTER — Ambulatory Visit (INDEPENDENT_AMBULATORY_CARE_PROVIDER_SITE_OTHER): Payer: Medicare Other | Admitting: Family

## 2012-10-13 ENCOUNTER — Encounter: Payer: Self-pay | Admitting: Family

## 2012-10-13 VITALS — BP 116/60 | HR 61 | Temp 99.1°F | Resp 16 | Wt 202.0 lb

## 2012-10-13 DIAGNOSIS — G4700 Insomnia, unspecified: Secondary | ICD-10-CM

## 2012-10-13 DIAGNOSIS — R634 Abnormal weight loss: Secondary | ICD-10-CM | POA: Diagnosis not present

## 2012-10-13 MED ORDER — DIPHENHYDRAMINE HCL 25 MG PO CAPS: 25.0000 mg | ORAL_CAPSULE | Freq: Four times a day (QID) | ORAL | Status: DC | PRN

## 2012-10-13 NOTE — Progress Notes (Signed)
Subjective:    Patient ID: Edward [name], male    DOB: [birth year], 67 y.o.   MRN: [id number]  HPI  Mr.  Edward [name] is a 67 yr old male who presents today with his sister and care givers with chief complaint of insomnia. Pt goes to bed around 10PM.  Gets up in the middle of night and wanders to kitchen in his group home. Starts eating things in the pantry. Sometimes sleeps in the easy chair in the living room. He does not nap during the day.  He does not drink caffeine.  Has been an issue for some time.  The psychiatrist tried seroquel to 2 tabs at night to help with this but it did not help and so now he is back on one tab bid.  Weight loss-sister notes 19 pound weight loss since he moved to this group home. Often will not eat the prepared meal at dinner and instead eats a bowl of cereal.   Review of Systems See hpi  Past Medical History  Diagnosis Date  . Diabetes mellitus   . Urinary incontinence     History   Social History  . Marital Status: Single    Spouse Name: N/A    Number of Children: N/A  . Years of Education: N/A   Occupational History  . Not on file.   Social History Main Topics  . Smoking status: Never Smoker   . Smokeless tobacco: Current User     Comment: chews tobacco  . Alcohol Use: No  . Drug Use: Not on file  . Sexually Active: Not on file   Other Topics Concern  . Not on file   Social History Narrative   Lives in a group home   Sister Edward [name] is his legal guardian.   Sister and her family live locally    Past Surgical History  Procedure Laterality Date  . Prostate surgery      x2, ?procedure    Family History  Problem Relation Age of Onset  . Arthritis Mother   . Arrhythmia Mother   . Lymphoma Mother     non-hodgkins  . Heart disease Father   . Stroke Father     No Known Allergies  Current Outpatient Prescriptions on File Prior to Visit  Medication Sig Dispense Refill  . aspirin 81 MG tablet Take 81 mg by mouth daily.      Marland Kitchen  desonide (DESOWEN) 0.05 % lotion Apply 1 application topically 3 (three) times a week.      . dutasteride (AVODART) 0.5 MG capsule Take 0.5 mg by mouth daily. Do Not Crush      . FLUoxetine (PROZAC) 20 MG capsule Take 20 mg by mouth daily.      . hydrocortisone valerate cream (WESTCORT) 0.2 % Apply 1 application topically 2 (two) times daily.      Marland Kitchen ketoconazole (NIZORAL) 2 % cream Apply 1 application topically 2 (two) times daily. For face      . thioridazine (MELLARIL) 100 MG tablet Take 2 tablets (200 mg total) by mouth at bedtime.  30 tablet  10  . thioridazine (MELLARIL) 50 MG tablet Take 1 tablet (50 mg total) by mouth every morning.  30 tablet  10  . topiramate (TOPAMAX) 25 MG tablet Take 1 tablet (25 mg total) by mouth 2 (two) times daily.  60 tablet  10   No current facility-administered medications on file prior to visit.    BP 116/60  Pulse 61  Temp(Src)  99.1 F (37.3 C) (Oral)  Resp 16  Wt 202 lb (91.627 kg)  BMI 30.47 kg/m2  SpO2 99%       Objective:   Physical Exam  Constitutional: He appears well-developed and well-nourished. No distress.  Cardiovascular: Normal rate and regular rhythm.   No murmur heard. Pulmonary/Chest: Effort normal and breath sounds normal. No respiratory distress. He has no wheezes. He has no rales. He exhibits no tenderness.  Musculoskeletal: He exhibits no edema.  Neurological: He is alert.          Assessment & Plan:  Benadryl 25mg  PO QHS- clarified instructions with pharmacy as well as #30 tabs with 5 refills.

## 2012-10-13 NOTE — Assessment & Plan Note (Signed)
Likely related to increase in activity and dietary change. He will follow up in 24month. If continued weight loss consider additional work up.

## 2012-10-13 NOTE — Patient Instructions (Addendum)
Please follow up in 1 month.  

## 2012-10-13 NOTE — Assessment & Plan Note (Signed)
Avoid benzos, avoid ambien due to polypharmacy and increased falls risk. Will give trial of benadryl hs. We discussed trying to avoid napping in the afternoon and evening.

## 2012-10-30 ENCOUNTER — Telehealth: Payer: Self-pay | Admitting: *Deleted

## 2012-10-30 NOTE — Telephone Encounter (Signed)
Received denial from pt's insurance for his rapaflo rx prescribed by the urologist. Denial faxed to [facility] [phone]).

## 2012-11-08 ENCOUNTER — Ambulatory Visit (INDEPENDENT_AMBULATORY_CARE_PROVIDER_SITE_OTHER): Payer: Medicare Other | Admitting: Family

## 2012-11-08 ENCOUNTER — Other Ambulatory Visit: Payer: Self-pay | Admitting: Family

## 2012-11-08 ENCOUNTER — Encounter: Payer: Self-pay | Admitting: Family

## 2012-11-08 VITALS — BP 116/70 | HR 96 | Temp 97.4°F | Resp 16 | Ht 68.25 in | Wt 198.0 lb

## 2012-11-08 DIAGNOSIS — R634 Abnormal weight loss: Secondary | ICD-10-CM

## 2012-11-08 DIAGNOSIS — F70 Mild intellectual disabilities: Secondary | ICD-10-CM | POA: Diagnosis not present

## 2012-11-08 DIAGNOSIS — F209 Schizophrenia, unspecified: Secondary | ICD-10-CM | POA: Diagnosis not present

## 2012-11-08 LAB — TSH: TSH: 6.169 u[IU]/mL — ABNORMAL HIGH (ref 0.350–4.500)

## 2012-11-08 NOTE — Progress Notes (Signed)
Subjective:    Patient ID: Edward [name], male    DOB: 02-Aug-1945, 67 y.o.   MRN: [id number]  HPI  Edward [name] is a 67 yr old male who presents today for follow up.  1) Weight loss- he has lost an additional 4 pounds since his last visit.  2) Insomnia- last visit we added benadryl HS. He is eating well according to sister and caregiver.  He is sleeping much better since he started the benadryl.    Review of Systems See HPI  Past Medical History  Diagnosis Date  . Diabetes mellitus   . Urinary incontinence     History   Social History  . Marital Status: Single    Spouse Name: N/A    Number of Children: N/A  . Years of Education: N/A   Occupational History  . Not on file.   Social History Main Topics  . Smoking status: Never Smoker   . Smokeless tobacco: Current User     Comment: chews tobacco  . Alcohol Use: No  . Drug Use: Not on file  . Sexually Active: Not on file   Other Topics Concern  . Not on file   Social History Narrative   Lives in a group home   [name] is his legal guardian.   Sister and her family live locally    Past Surgical History  Procedure Laterality Date  . Prostate surgery      x2, ?procedure    Family History  Problem Relation Age of Onset  . Arthritis Mother   . Arrhythmia Mother   . Lymphoma Mother     non-hodgkins  . Heart disease Father   . Stroke Father     No Known Allergies  Current Outpatient Prescriptions on File Prior to Visit  Medication Sig Dispense Refill  . aspirin 81 MG tablet Take 81 mg by mouth daily.      Marland Kitchen desonide (DESOWEN) 0.05 % lotion Apply 1 application topically 3 (three) times a week.      . diphenhydrAMINE (BENADRYL) 25 mg capsule Take 25 mg by mouth at bedtime.      . dutasteride (AVODART) 0.5 MG capsule Take 0.5 mg by mouth daily. Do Not Crush      . FLUoxetine (PROZAC) 20 MG capsule Take 20 mg by mouth daily.      . hydrocortisone valerate cream (WESTCORT) 0.2 % Apply 1 application  topically 2 (two) times daily.      Marland Kitchen ketoconazole (NIZORAL) 2 % cream Apply 1 application topically 2 (two) times daily. For face      . QUEtiapine (SEROQUEL) 50 MG tablet Take 1 tablet at 8am and 8pm.      . TAMSULOSIN HCL PO Take 1 tablet by mouth every morning.      . thioridazine (MELLARIL) 100 MG tablet Take 2 tablets (200 mg total) by mouth at bedtime.  30 tablet  10  . thioridazine (MELLARIL) 50 MG tablet Take 1 tablet (50 mg total) by mouth every morning.  30 tablet  10  . topiramate (TOPAMAX) 25 MG tablet Take 1 tablet (25 mg total) by mouth 2 (two) times daily.  60 tablet  10   No current facility-administered medications on file prior to visit.    BP 116/70  Pulse 96  Temp(Src) 97.4 F (36.3 C) (Oral)  Resp 16  Ht 5' 8.25" (1.734 m)  Wt 198 lb (89.812 kg)  BMI 29.87 kg/m2  SpO2 98%  Objective:   Physical Exam  Constitutional: He is oriented to person, place, and time. He appears well-developed and well-nourished. No distress.  HENT:  Head: Normocephalic and atraumatic.  Mouth/Throat: No oropharyngeal exudate.  Cardiovascular: Normal rate and regular rhythm.   No murmur heard. Pulmonary/Chest: Effort normal and breath sounds normal. No respiratory distress. He has no wheezes. He has no rales. He exhibits no tenderness.  Musculoskeletal: He exhibits no tenderness.  Lymphadenopathy:    He has no cervical adenopathy.  Neurological: He is alert and oriented to person, place, and time.  Skin: Skin is warm and dry.          Assessment & Plan:

## 2012-11-08 NOTE — Patient Instructions (Addendum)
Please follow up in 2 months. 

## 2012-11-09 DIAGNOSIS — N139 Obstructive and reflux uropathy, unspecified: Secondary | ICD-10-CM | POA: Diagnosis not present

## 2012-11-13 NOTE — Assessment & Plan Note (Signed)
I think that his weight loss is likely due to increased activity and improved diet since he has been in the group home.  I did speak with his sister who is his legal guardian about the possibility of underlying malignancy being a cause for his weight loss. We discussed that if we pursue further work up of this, then we need to decide what we will do with the results. She tells me that she does not wish to pursue further work up because she would not put him through treatment should a malignancy be discovered. At this point, she is comfortable continuing to monitor his weight. He is still technically overweight. When he gets into an ideal body weight, then we can consider adding a supplement such as ensure for weight maintenance.  A TSH is performed as well and is noted to be mildly elevated. Await free T2/free T4. 15 minutes spent with pt, sister and caregiver Edward [name].  >50% of this time was spent on counseling related to his weight loss.

## 2012-11-15 ENCOUNTER — Telehealth: Payer: Self-pay | Admitting: Family

## 2012-11-15 ENCOUNTER — Encounter: Payer: Self-pay | Admitting: Family

## 2012-11-15 DIAGNOSIS — R7989 Other specified abnormal findings of blood chemistry: Secondary | ICD-10-CM

## 2012-11-15 DIAGNOSIS — R634 Abnormal weight loss: Secondary | ICD-10-CM

## 2012-11-15 DIAGNOSIS — E039 Hypothyroidism, unspecified: Secondary | ICD-10-CM

## 2012-11-15 MED ORDER — LEVOTHYROXINE SODIUM 25 MCG PO TABS: 25.0000 ug | ORAL_TABLET | Freq: Every day | ORAL | Status: DC

## 2012-11-15 NOTE — Telephone Encounter (Signed)
[name] sister informed, understood & agreed, will coordinate with Group Home; future lab order placed/SLS

## 2012-11-15 NOTE — Telephone Encounter (Signed)
Pls call pt's sister and let her know that his thyroid is underactive.  I would like for him to add levothyroxine once daily and plan follow up TSH in 4 weeks.

## 2012-11-16 DIAGNOSIS — E039 Hypothyroidism, unspecified: Secondary | ICD-10-CM | POA: Diagnosis not present

## 2012-11-16 NOTE — Addendum Note (Signed)
Addended by: Mervin Kung A on: 11/16/2012 12:45 PM   Modules accepted: Orders

## 2012-11-16 NOTE — Telephone Encounter (Signed)
Received call from group home requesting clarification of thyroid medication they received for pt today. Left detailed message on Charlotte's voicemail (caregiver) re: instructions below and to call if any questions. Future order entered.

## 2012-11-21 ENCOUNTER — Emergency Department (HOSPITAL_BASED_OUTPATIENT_CLINIC_OR_DEPARTMENT_OTHER)
Admission: EM | Admit: 2012-11-21 | Discharge: 2012-11-21 | Disposition: A | Discharge status: Home / Self Care | Payer: Medicare Other | Attending: Emergency Medicine | Admitting: Emergency Medicine

## 2012-11-21 ENCOUNTER — Emergency Department (HOSPITAL_BASED_OUTPATIENT_CLINIC_OR_DEPARTMENT_OTHER): Payer: Medicare Other

## 2012-11-21 ENCOUNTER — Encounter (HOSPITAL_BASED_OUTPATIENT_CLINIC_OR_DEPARTMENT_OTHER): Payer: Self-pay | Admitting: *Deleted

## 2012-11-21 ENCOUNTER — Telehealth: Payer: Self-pay | Admitting: Family

## 2012-11-21 DIAGNOSIS — M549 Dorsalgia, unspecified: Secondary | ICD-10-CM | POA: Diagnosis not present

## 2012-11-21 DIAGNOSIS — K8020 Calculus of gallbladder without cholecystitis without obstruction: Secondary | ICD-10-CM | POA: Diagnosis not present

## 2012-11-21 DIAGNOSIS — Z87440 Personal history of urinary (tract) infections: Secondary | ICD-10-CM | POA: Insufficient documentation

## 2012-11-21 DIAGNOSIS — Z7982 Long term (current) use of aspirin: Secondary | ICD-10-CM | POA: Diagnosis not present

## 2012-11-21 DIAGNOSIS — Z79899 Other long term (current) drug therapy: Secondary | ICD-10-CM | POA: Diagnosis not present

## 2012-11-21 DIAGNOSIS — R109 Unspecified abdominal pain: Secondary | ICD-10-CM

## 2012-11-21 DIAGNOSIS — E119 Type 2 diabetes mellitus without complications: Secondary | ICD-10-CM | POA: Diagnosis not present

## 2012-11-21 DIAGNOSIS — N2889 Other specified disorders of kidney and ureter: Secondary | ICD-10-CM

## 2012-11-21 DIAGNOSIS — F79 Unspecified intellectual disabilities: Secondary | ICD-10-CM | POA: Diagnosis not present

## 2012-11-21 DIAGNOSIS — N289 Disorder of kidney and ureter, unspecified: Secondary | ICD-10-CM | POA: Diagnosis not present

## 2012-11-21 LAB — BASIC METABOLIC PANEL
Chloride: 104 mEq/L (ref 96–112)
GFR calc Af Amer: 87 mL/min — ABNORMAL LOW (ref >90)
Potassium: 4 mEq/L (ref 3.5–5.1)

## 2012-11-21 LAB — URINALYSIS, ROUTINE W REFLEX MICROSCOPIC
Glucose, UA: NEGATIVE mg/dL
Leukocytes, UA: NEGATIVE
pH: 7 (ref 5.0–8.0)

## 2012-11-21 NOTE — ED Notes (Signed)
Unable to urinate

## 2012-11-21 NOTE — ED Provider Notes (Signed)
History     CSN: [id number]  Arrival date & time 11/21/12  1051   First MD Initiated Contact with Patient 11/21/12 1103      Chief Complaint  Patient presents with  . Abdominal Pain    (Consider location/radiation/quality/duration/timing/severity/associated sxs/prior treatment) HPI Comments: Patient presents with a three-day history of intermittent right flank pain. He currently denies any pain and states that the pain grabs him at times. He points to his right midabdomen and right mid back area as to where it is hurting him. His history is limited due to mental retardation. He currently lives in a group home. The caretaker at the group home has not noted any vomiting or fevers. Patient denies any testicular pain. He denies any burning on urination. He has had a history of UTIs but no known history of kidney stones.  Patient is a 67 y.o. male presenting with abdominal pain.  Abdominal Pain Associated symptoms: no chest pain, no chills, no cough, no diarrhea, no fatigue, no fever, no hematuria, no nausea, no shortness of breath and no vomiting     Past Medical History  Diagnosis Date  . Diabetes mellitus   . Urinary incontinence     Past Surgical History  Procedure Laterality Date  . Prostate surgery      x2, ?procedure    Family History  Problem Relation Age of Onset  . Arthritis Mother   . Arrhythmia Mother   . Lymphoma Mother     non-hodgkins  . Heart disease Father   . Stroke Father     History  Substance Use Topics  . Smoking status: Never Smoker   . Smokeless tobacco: Current User     Comment: chews tobacco  . Alcohol Use: No      Review of Systems  Constitutional: Negative for fever, chills, diaphoresis and fatigue.  HENT: Negative for congestion, rhinorrhea and sneezing.   Eyes: Negative.   Respiratory: Negative for cough, chest tightness and shortness of breath.   Cardiovascular: Negative for chest pain and leg swelling.  Gastrointestinal: Positive  for abdominal pain. Negative for nausea, vomiting, diarrhea and blood in stool.  Genitourinary: Negative for frequency, hematuria, flank pain and difficulty urinating.  Musculoskeletal: Negative for back pain and arthralgias.  Skin: Negative for rash.  Neurological: Negative for dizziness, speech difficulty, weakness, numbness and headaches.    Allergies  Review of patient's allergies indicates no known allergies.  Home Medications   Current Outpatient Rx  Name  Route  Sig  Dispense  Refill  . aspirin 81 MG tablet   Oral   Take 81 mg by mouth daily.         Marland Kitchen desonide (DESOWEN) 0.05 % lotion   Topical   Apply 1 application topically 3 (three) times a week.         . diphenhydrAMINE (BENADRYL) 25 mg capsule   Oral   Take 25 mg by mouth at bedtime.         . dutasteride (AVODART) 0.5 MG capsule   Oral   Take 0.5 mg by mouth daily. Do Not Crush         . FLUoxetine (PROZAC) 20 MG capsule   Oral   Take 20 mg by mouth daily.         . hydrocortisone valerate cream (WESTCORT) 0.2 %   Topical   Apply 1 application topically 2 (two) times daily.         Marland Kitchen ketoconazole (NIZORAL) 2 % cream  Topical   Apply 1 application topically 2 (two) times daily. For face         . levothyroxine (SYNTHROID, LEVOTHROID) 25 MCG tablet   Oral   Take 1 tablet (25 mcg total) by mouth daily before breakfast.   30 tablet   1   . QUEtiapine (SEROQUEL) 50 MG tablet      Take 1 tablet at 8am and 8pm.         . TAMSULOSIN HCL PO   Oral   Take 1 tablet by mouth every morning.         . thioridazine (MELLARIL) 100 MG tablet   Oral   Take 2 tablets (200 mg total) by mouth at bedtime.   30 tablet   10   . thioridazine (MELLARIL) 50 MG tablet   Oral   Take 1 tablet (50 mg total) by mouth every morning.   30 tablet   10   . topiramate (TOPAMAX) 25 MG tablet   Oral   Take 1 tablet (25 mg total) by mouth 2 (two) times daily.   60 tablet   10     BP 118/74  Pulse  75  Temp(Src) 98.2 F (36.8 C) (Oral)  Resp 18  Ht 5\' 9"  (1.753 m)  Wt 200 lb (90.719 kg)  BMI 29.52 kg/m2  SpO2 98%  Physical Exam  Constitutional: He is oriented to person, place, and time. He appears well-developed and well-nourished.  HENT:  Head: Normocephalic and atraumatic.  Eyes: Pupils are equal, round, and reactive to light.  Neck: Normal range of motion. Neck supple.  Cardiovascular: Normal rate, regular rhythm and normal heart sounds.   Pulmonary/Chest: Effort normal and breath sounds normal. No respiratory distress. He has no wheezes. He has no rales. He exhibits no tenderness.  Abdominal: Soft. Bowel sounds are normal. There is no tenderness. There is no rebound and no guarding.  Musculoskeletal: Normal range of motion. He exhibits no edema.  Lymphadenopathy:    He has no cervical adenopathy.  Neurological: He is alert and oriented to person, place, and time.  Skin: Skin is warm and dry. No rash noted.  Psychiatric: He has a normal mood and affect.    ED Course  Procedures (including critical care time)  Results for orders placed during the hospital encounter of 11/21/12  URINALYSIS, ROUTINE W REFLEX MICROSCOPIC      Result Value Range   Color, Urine YELLOW  YELLOW   APPearance CLEAR  CLEAR   Specific Gravity, Urine 1.009  1.005 - 1.030   pH 7.0  5.0 - 8.0   Glucose, UA NEGATIVE  NEGATIVE mg/dL   Hgb urine dipstick NEGATIVE  NEGATIVE   Bilirubin Urine NEGATIVE  NEGATIVE   Ketones, ur NEGATIVE  NEGATIVE mg/dL   Protein, ur NEGATIVE  NEGATIVE mg/dL   Urobilinogen, UA 0.2  0.0 - 1.0 mg/dL   Nitrite NEGATIVE  NEGATIVE   Leukocytes, UA NEGATIVE  NEGATIVE  BASIC METABOLIC PANEL      Result Value Range   Sodium 140  135 - 145 mEq/L   Potassium 4.0  3.5 - 5.1 mEq/L   Chloride 104  96 - 112 mEq/L   CO2 30  19 - 32 mEq/L   Glucose, Bld 194 (*) 70 - 99 mg/dL   BUN 13  6 - 23 mg/dL   Creatinine, Ser 5.28  0.50 - 1.35 mg/dL   Calcium 8.8  8.4 - 41.3 mg/dL   GFR  calc non Af  Amer 75 (*) >90 mL/min   GFR calc Af Amer 87 (*) >90 mL/min   Ct Abdomen Pelvis Wo Contrast  11/21/2012  *RADIOLOGY REPORT*  Clinical Data: Right flank pain.  CT ABDOMEN AND PELVIS WITHOUT CONTRAST  Technique:  Multidetector CT imaging of the abdomen and pelvis was performed following the standard protocol without intravenous contrast.  Comparison: None.  Findings: Heart is mildly enlarged.  Lung bases are clear.  No pleural effusions.  Small gallstones noted within the gallbladder.  Liver, spleen, pancreas, adrenals have an unremarkable unenhanced appearance.  5.1 cm water density mass in the upper pole of the right kidney likely reflects a benign cyst although this cannot be fully characterize without intravenous contrast.  Somewhat vague appearing low density area within the mid pole of the left kidney measures 16 mm.  This cannot be characterized on this unenhanced study.  I cannot exclude a solid renal lesion.  Urinary bladder is mildly distended.  There is no hydronephrosis. No renal or ureteral stones.  Mild gaseous distention of the colon.  Small bowel is decompressed. Moderate stool burden throughout the colon.  No free fluid, free air or adenopathy.  Aorta is normal caliber.  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  Degenerative changes in the lumbar spine.  No acute bony abnormality.  IMPRESSION: No renal or ureteral stones.  No hydronephrosis.  Mildly distended urinary bladder.  Cholelithiasis.  Probable cyst in the upper pole of the right kidney.  Low density area centrally within the left kidney cannot be confirmed to be a cyst.  This can be further evaluated with nonemergent renal ultrasound or contrast enhanced CT.  No acute process.   Original Report Authenticated By: Charlett Nose, M.D.      1. Flank pain   2. Renal mass       MDM  Patient with no evidence of UTI. He has no evidence of kidney stones. Patient currently denies any discomfort. He has no pain on  abdominal exam. There is a question small growth on his left kidney. On review of his old records from his primary care physician, his family had indicated that they would not pursue any further evaluation of potential cancerous areas. I did advise the patient to make a followup appointment with his primary care physician within the next week for recheck and they can further discuss the possible evaluation of the renal mass.        Rolan Bucco, MD 11/21/12 [phone]

## 2012-11-21 NOTE — ED Notes (Signed)
Pt is unable to urinate

## 2012-11-21 NOTE — ED Notes (Signed)
Patient unable to void at this time

## 2012-11-21 NOTE — ED Notes (Signed)
Right lateral mid quad pain x 4 days. Pain goes into his flank. He is from a group home. Hx of same pain.

## 2012-11-21 NOTE — Telephone Encounter (Signed)
Patients caregiver Claris Gower called stating that patient was just seen in the ED downstairs for side pain. She states that the ED did a test on patient that showed a mass on his kidney. She would like to know if patient should follow up with [facility] or his urologist?

## 2012-11-22 ENCOUNTER — Ambulatory Visit (HOSPITAL_COMMUNITY): Payer: Self-pay | Admitting: Psychiatry

## 2012-11-22 NOTE — Telephone Encounter (Signed)
I can see him

## 2012-11-22 NOTE — Telephone Encounter (Signed)
Called Claris Gower back to let her know that Efraim Kaufmann can see patient.

## 2012-11-24 ENCOUNTER — Telehealth: Payer: Self-pay | Admitting: *Deleted

## 2012-11-24 ENCOUNTER — Ambulatory Visit (INDEPENDENT_AMBULATORY_CARE_PROVIDER_SITE_OTHER): Payer: Medicare Other | Admitting: Family

## 2012-11-24 ENCOUNTER — Encounter: Payer: Self-pay | Admitting: Family

## 2012-11-24 VITALS — BP 128/68 | HR 85 | Temp 98.3°F | Resp 16 | Wt 199.0 lb

## 2012-11-24 DIAGNOSIS — N289 Disorder of kidney and ureter, unspecified: Secondary | ICD-10-CM | POA: Diagnosis not present

## 2012-11-24 DIAGNOSIS — N2889 Other specified disorders of kidney and ureter: Secondary | ICD-10-CM

## 2012-11-24 DIAGNOSIS — R109 Unspecified abdominal pain: Secondary | ICD-10-CM | POA: Diagnosis not present

## 2012-11-24 NOTE — Patient Instructions (Addendum)
Please keep appointment with Urology on Monday. Follow up in June as scheduled.

## 2012-11-24 NOTE — Assessment & Plan Note (Signed)
Resolved. Was likely related to constipation.

## 2012-11-24 NOTE — Progress Notes (Signed)
Subjective:    Patient ID: Edward [name], male    DOB: Jul 24, 1946, 67 y.o.   MRN: [id number]  HPI  Edward [name] is a 67 yr old male who presents today for ED follow up of his flank pain.  He underwent a CT of the abdomen and pelvis in the ED which was unremarkable except for incidental note of:   "5.1 cm water density mass in the upper pole of the right kidney likely  reflects a benign cyst although this cannot be fully characterize  without intravenous contrast. Somewhat vague appearing low density  area within the mid pole of the left kidney measures 16 mm. This  cannot be characterized on this unenhanced study."  Contrasted CT or Renal US was recommended.  Per caregiver he was constipated at time x 3 days.  Reports that he had a BM in the ED and then felt better.  He has had no further abdominal or flank pain since that time.   Review of Systems See HPI  Past Medical History  Diagnosis Date  . Diabetes mellitus   . Urinary incontinence     History   Social History  . Marital Status: Single    Spouse Name: N/A    Number of Children: N/A  . Years of Education: N/A   Occupational History  . Not on file.   Social History Main Topics  . Smoking status: Never Smoker   . Smokeless tobacco: Current User     Comment: chews tobacco  . Alcohol Use: No  . Drug Use: No  . Sexually Active: Not on file   Other Topics Concern  . Not on file   Social History Narrative   Lives in a group home   [name] is his legal guardian.   Sister and her family live locally    Past Surgical History  Procedure Laterality Date  . Prostate surgery      x2, ?procedure    Family History  Problem Relation Age of Onset  . Arthritis Mother   . Arrhythmia Mother   . Lymphoma Mother     non-hodgkins  . Heart disease Father   . Stroke Father     No Known Allergies  Current Outpatient Prescriptions on File Prior to Visit  Medication Sig Dispense Refill  . aspirin 81 MG tablet  Take 81 mg by mouth daily.      Marland Kitchen desonide (DESOWEN) 0.05 % lotion Apply 1 application topically 3 (three) times a week.      . diphenhydrAMINE (BENADRYL) 25 mg capsule Take 25 mg by mouth at bedtime.      . dutasteride (AVODART) 0.5 MG capsule Take 0.5 mg by mouth daily. Do Not Crush      . FLUoxetine (PROZAC) 20 MG capsule Take 20 mg by mouth daily.      . hydrocortisone valerate cream (WESTCORT) 0.2 % Apply 1 application topically 2 (two) times daily.      Marland Kitchen ketoconazole (NIZORAL) 2 % cream Apply 1 application topically 2 (two) times daily. For face      . levothyroxine (SYNTHROID, LEVOTHROID) 25 MCG tablet Take 1 tablet (25 mcg total) by mouth daily before breakfast.  30 tablet  1  . QUEtiapine (SEROQUEL) 50 MG tablet Take 1 tablet at 8am and 8pm.      . TAMSULOSIN HCL PO Take 1 tablet by mouth every morning.      . thioridazine (MELLARIL) 100 MG tablet Take 2 tablets (200 mg total)  by mouth at bedtime.  30 tablet  10  . thioridazine (MELLARIL) 50 MG tablet Take 1 tablet (50 mg total) by mouth every morning.  30 tablet  10  . topiramate (TOPAMAX) 25 MG tablet Take 1 tablet (25 mg total) by mouth 2 (two) times daily.  60 tablet  10   No current facility-administered medications on file prior to visit.    BP 128/68  Pulse 85  Temp(Src) 98.3 F (36.8 C) (Oral)  Resp 16  Wt 199 lb (90.266 kg)  BMI 29.37 kg/m2  SpO2 99%       Objective:   Physical Exam  Constitutional: He is oriented to person, place, and time. He appears well-developed and well-nourished. No distress.  Cardiovascular: Normal rate and regular rhythm.   No murmur heard. Pulmonary/Chest: Effort normal and breath sounds normal. No respiratory distress. He has no wheezes. He has no rales. He exhibits no tenderness.  Abdominal: Soft. Bowel sounds are normal. He exhibits no distension and no mass. There is no tenderness. There is no rebound and no guarding.  Neurological: He is alert and oriented to person, place, and  time.  Skin: Skin is warm and dry.  Psychiatric: He has a normal mood and affect. His behavior is normal. Judgment and thought content normal.          Assessment & Plan:

## 2012-11-24 NOTE — Telephone Encounter (Signed)
Received call from pt's sister stating she is trying to arrange a CT with Dr Madilyn Hook office for Monday. Jesse Fall that we are seeing pt today in follow up from the ER. Per verbal from Provider ok to cancel pt's appt with [place] today if he is not continuing to have pain and will f/u with Dr Brunilda Payor for CT. [name] sister reports that as of yesterday pt was continuing to have pain. Advised her we will keep pt's appt as is and I will notify her if CT is arrange through our office before Monday.

## 2012-11-24 NOTE — Assessment & Plan Note (Addendum)
[name] POA (sister) has made arrangements with Dr. Brunilda Payor for further evaluation/imaging of renal mass and will meet with him on Monday.  Will defer further work up to Urology.

## 2012-11-24 NOTE — Telephone Encounter (Signed)
Notified pt's sister per Provider that pt reported much improvement of pain today and we would defer management of CT to Dr Brunilda Payor at pt's appt. Monday.

## 2012-11-27 DIAGNOSIS — N281 Cyst of kidney, acquired: Secondary | ICD-10-CM | POA: Diagnosis not present

## 2012-11-27 DIAGNOSIS — N139 Obstructive and reflux uropathy, unspecified: Secondary | ICD-10-CM | POA: Diagnosis not present

## 2012-11-28 ENCOUNTER — Other Ambulatory Visit: Payer: Self-pay | Admitting: Family

## 2012-11-28 NOTE — Telephone Encounter (Signed)
DENIED--Last Rx 04.23.14 #30x1, verified w/pharmacy/SLS

## 2012-11-29 ENCOUNTER — Ambulatory Visit: Payer: Self-pay | Admitting: Family

## 2012-12-15 DIAGNOSIS — E039 Hypothyroidism, unspecified: Secondary | ICD-10-CM | POA: Diagnosis not present

## 2012-12-16 ENCOUNTER — Telehealth: Payer: Self-pay | Admitting: Family

## 2012-12-16 DIAGNOSIS — E039 Hypothyroidism, unspecified: Secondary | ICD-10-CM

## 2012-12-16 MED ORDER — LEVOTHYROXINE SODIUM 50 MCG PO TABS: 50.0000 ug | ORAL_TABLET | Freq: Every day | ORAL | Status: DC

## 2012-12-16 NOTE — Telephone Encounter (Signed)
Thyroid med should be increased based on lab result. Will increase synthroid form 25 to 50 mcg.  Repeat tsh in 6 weeks (dx hypothyroid).

## 2012-12-19 MED ORDER — LEVOTHYROXINE SODIUM 50 MCG PO TABS: 50.0000 ug | ORAL_TABLET | Freq: Every day | ORAL | Status: DC

## 2012-12-19 NOTE — Telephone Encounter (Signed)
Left message on caretaker's voicemail to return my call.

## 2012-12-19 NOTE — Telephone Encounter (Signed)
Notified Claris Gower, pt's caregiver. She requests that we fax new rx to her at [phone] to submit to their pharmacy as they were told [facility] does not receive e-scripts. Advised her that our system indicates it was received by [facility] on 12/16/12 10:10am.  I will reprint and fax rx to [facility]. She also gave me fax for [facility] [phone]) for future reference. Future lab order placed.

## 2012-12-21 DIAGNOSIS — N139 Obstructive and reflux uropathy, unspecified: Secondary | ICD-10-CM | POA: Diagnosis not present

## 2012-12-21 DIAGNOSIS — N401 Enlarged prostate with lower urinary tract symptoms: Secondary | ICD-10-CM | POA: Diagnosis not present

## 2013-01-05 ENCOUNTER — Ambulatory Visit (INDEPENDENT_AMBULATORY_CARE_PROVIDER_SITE_OTHER): Payer: Medicare Other | Admitting: Family

## 2013-01-05 ENCOUNTER — Telehealth: Payer: Self-pay | Admitting: Family

## 2013-01-05 ENCOUNTER — Encounter: Payer: Self-pay | Admitting: Family

## 2013-01-05 VITALS — BP 110/74 | HR 84 | Temp 97.7°F | Resp 16 | Wt 200.0 lb

## 2013-01-05 DIAGNOSIS — R109 Unspecified abdominal pain: Secondary | ICD-10-CM | POA: Diagnosis not present

## 2013-01-05 DIAGNOSIS — H6122 Impacted cerumen, left ear: Secondary | ICD-10-CM

## 2013-01-05 DIAGNOSIS — H6120 Impacted cerumen, unspecified ear: Secondary | ICD-10-CM | POA: Diagnosis not present

## 2013-01-05 DIAGNOSIS — E119 Type 2 diabetes mellitus without complications: Secondary | ICD-10-CM

## 2013-01-05 DIAGNOSIS — E039 Hypothyroidism, unspecified: Secondary | ICD-10-CM | POA: Diagnosis not present

## 2013-01-05 NOTE — Telephone Encounter (Signed)
Could you pls request any office notes since march from Dr. Madilyn Hook office? thank

## 2013-01-05 NOTE — Assessment & Plan Note (Addendum)
Cerumen plug was removed from the left ear using curette.  Pt tolerated procedure.  There is some concern about hearing loss.  Discussed with caregiver possibility of referring for formal hearing test. She will discuss with pt's sister POA and let us know if they wish to proceed.

## 2013-01-05 NOTE — Assessment & Plan Note (Signed)
Resolved

## 2013-01-05 NOTE — Progress Notes (Signed)
Subjective:    Patient ID: Edward [name], male    DOB: Jun 29, 1946, 67 y.o.   MRN: [id number]  HPI  Edward [name] is a 67 yr old male who presents today for follow up.    Hearing Problem Caregiver notes recent decreased hearing. Staff is having to talk louder. If he is turned away.    Gait Problem Facility noted that pt was unsteady on his feet yesterday but seems better today. Caregiver notes that he is "fine today." no slurred speech.    bruising Pt would like assessment of possible bruise on right arm.   Flank Pain Caregiver reports resolution of flank pain. Pt was cleard by urology for cysts on his kidney that they will monitor. He met with Dr. Brunilda Payor and had follow up and was told to follow up in 1 year.     Review of Systems See HPI Past Medical History  Diagnosis Date  . Diabetes mellitus   . Urinary incontinence     History   Social History  . Marital Status: Single    Spouse Name: N/A    Number of Children: N/A  . Years of Education: N/A   Occupational History  . Not on file.   Social History Main Topics  . Smoking status: Never Smoker   . Smokeless tobacco: Current User     Comment: chews tobacco  . Alcohol Use: No  . Drug Use: No  . Sexually Active: Not on file   Other Topics Concern  . Not on file   Social History Narrative   Lives in a group home   [name] is his legal guardian.   Sister and her family live locally    Past Surgical History  Procedure Laterality Date  . Prostate surgery      x2, ?procedure    Family History  Problem Relation Age of Onset  . Arthritis Mother   . Arrhythmia Mother   . Lymphoma Mother     non-hodgkins  . Heart disease Father   . Stroke Father     No Known Allergies  Current Outpatient Prescriptions on File Prior to Visit  Medication Sig Dispense Refill  . aspirin 81 MG tablet Take 81 mg by mouth daily.      Marland Kitchen desonide (DESOWEN) 0.05 % lotion Apply 1 application topically 3 (three) times a week.       . diphenhydrAMINE (BENADRYL) 25 mg capsule Take 25 mg by mouth at bedtime.      . dutasteride (AVODART) 0.5 MG capsule Take 0.5 mg by mouth daily. Do Not Crush      . FLUoxetine (PROZAC) 20 MG capsule Take 20 mg by mouth daily.      . hydrocortisone valerate cream (WESTCORT) 0.2 % Apply 1 application topically 2 (two) times daily.      Marland Kitchen ketoconazole (NIZORAL) 2 % cream Apply 1 application topically 2 (two) times daily. For face      . levothyroxine (SYNTHROID, LEVOTHROID) 50 MCG tablet Take 1 tablet (50 mcg total) by mouth daily.  30 tablet  2  . QUEtiapine (SEROQUEL) 50 MG tablet Take 1 tablet at 8am and 8pm.      . TAMSULOSIN HCL PO Take 1 tablet by mouth every morning.      . thioridazine (MELLARIL) 100 MG tablet Take 2 tablets (200 mg total) by mouth at bedtime.  30 tablet  10  . thioridazine (MELLARIL) 50 MG tablet Take 1 tablet (50 mg total) by mouth  every morning.  30 tablet  10  . topiramate (TOPAMAX) 25 MG tablet Take 1 tablet (25 mg total) by mouth 2 (two) times daily.  60 tablet  10   No current facility-administered medications on file prior to visit.    BP 110/74  Pulse 84  Temp(Src) 97.7 F (36.5 C) (Oral)  Resp 16  Wt 200 lb (90.719 kg)  BMI 29.52 kg/m2  SpO2 99%       Objective:   Physical Exam  Constitutional: He appears well-developed and well-nourished. No distress.  HENT:  Head: Normocephalic and atraumatic.  Cerumen impaction noted left ear, removed with curette.  Cardiovascular: Normal rate and regular rhythm.   No murmur heard. Pulmonary/Chest: Effort normal and breath sounds normal. No respiratory distress. He has no wheezes. He has no rales. He exhibits no tenderness.  Musculoskeletal: He exhibits no edema.  Neurological: He is alert.  Skin: Skin is warm and dry.  small bruise noted on right forearm        Assessment & Plan:

## 2013-01-05 NOTE — Assessment & Plan Note (Signed)
Synthroid was recently increased.  They will return for a 6 week follow up.

## 2013-01-05 NOTE — Patient Instructions (Addendum)
Please call if you would like to have Edward [name] hearing formally checked with audiology. Follow up in September for your wellness visit.

## 2013-01-12 NOTE — Telephone Encounter (Signed)
Called [facility]/Dr. Brunilda Payor office to request office notes from March to present; transferred to Medical Records, [facility] with contact name and number for return call and/or fax RE: provider request/SLS

## 2013-01-18 ENCOUNTER — Telehealth: Payer: Self-pay | Admitting: *Deleted

## 2013-01-18 NOTE — Telephone Encounter (Signed)
Received fax from [employer] on 01/10/13 requesting approval of diabetic testing supplies.  Confirmed with pt's caregiver, Edward [name] that they do want to use this company for his testing supplies. Completed form faxed to [phone].

## 2013-01-22 ENCOUNTER — Encounter: Payer: Self-pay | Admitting: Family

## 2013-01-22 ENCOUNTER — Encounter: Payer: Self-pay | Admitting: Urology

## 2013-02-05 ENCOUNTER — Encounter (HOSPITAL_COMMUNITY): Payer: Self-pay

## 2013-02-05 ENCOUNTER — Emergency Department (HOSPITAL_COMMUNITY)
Admission: EM | Admit: 2013-02-05 | Discharge: 2013-02-05 | Disposition: A | Discharge status: Home / Self Care | Payer: Medicare Other | Attending: Emergency Medicine | Admitting: Emergency Medicine

## 2013-02-05 ENCOUNTER — Emergency Department (HOSPITAL_COMMUNITY): Payer: Medicare Other

## 2013-02-05 DIAGNOSIS — W19XXXA Unspecified fall, initial encounter: Secondary | ICD-10-CM

## 2013-02-05 DIAGNOSIS — S0100XA Unspecified open wound of scalp, initial encounter: Secondary | ICD-10-CM | POA: Diagnosis not present

## 2013-02-05 DIAGNOSIS — IMO0002 Reserved for concepts with insufficient information to code with codable children: Secondary | ICD-10-CM | POA: Diagnosis not present

## 2013-02-05 DIAGNOSIS — S0181XA Laceration without foreign body of other part of head, initial encounter: Secondary | ICD-10-CM

## 2013-02-05 DIAGNOSIS — Z23 Encounter for immunization: Secondary | ICD-10-CM | POA: Diagnosis not present

## 2013-02-05 DIAGNOSIS — N400 Enlarged prostate without lower urinary tract symptoms: Secondary | ICD-10-CM | POA: Insufficient documentation

## 2013-02-05 DIAGNOSIS — I10 Essential (primary) hypertension: Secondary | ICD-10-CM | POA: Diagnosis not present

## 2013-02-05 DIAGNOSIS — Z7982 Long term (current) use of aspirin: Secondary | ICD-10-CM | POA: Insufficient documentation

## 2013-02-05 DIAGNOSIS — F79 Unspecified intellectual disabilities: Secondary | ICD-10-CM | POA: Diagnosis not present

## 2013-02-05 DIAGNOSIS — F209 Schizophrenia, unspecified: Secondary | ICD-10-CM | POA: Insufficient documentation

## 2013-02-05 DIAGNOSIS — Z79899 Other long term (current) drug therapy: Secondary | ICD-10-CM | POA: Diagnosis not present

## 2013-02-05 DIAGNOSIS — S0180XA Unspecified open wound of other part of head, initial encounter: Secondary | ICD-10-CM | POA: Insufficient documentation

## 2013-02-05 DIAGNOSIS — S0990XA Unspecified injury of head, initial encounter: Secondary | ICD-10-CM | POA: Diagnosis not present

## 2013-02-05 DIAGNOSIS — Y9210 Unspecified residential institution as the place of occurrence of the external cause: Secondary | ICD-10-CM | POA: Insufficient documentation

## 2013-02-05 DIAGNOSIS — E119 Type 2 diabetes mellitus without complications: Secondary | ICD-10-CM | POA: Diagnosis not present

## 2013-02-05 DIAGNOSIS — E079 Disorder of thyroid, unspecified: Secondary | ICD-10-CM | POA: Diagnosis not present

## 2013-02-05 DIAGNOSIS — Y93E8 Activity, other personal hygiene: Secondary | ICD-10-CM | POA: Insufficient documentation

## 2013-02-05 DIAGNOSIS — T1490XA Injury, unspecified, initial encounter: Secondary | ICD-10-CM | POA: Diagnosis not present

## 2013-02-05 DIAGNOSIS — W1809XA Striking against other object with subsequent fall, initial encounter: Secondary | ICD-10-CM | POA: Insufficient documentation

## 2013-02-05 DIAGNOSIS — S0993XA Unspecified injury of face, initial encounter: Secondary | ICD-10-CM | POA: Diagnosis not present

## 2013-02-05 HISTORY — DX: Schizophrenia, unspecified: F20.9

## 2013-02-05 HISTORY — DX: Disorder of thyroid, unspecified: E07.9

## 2013-02-05 HISTORY — DX: Benign prostatic hyperplasia without lower urinary tract symptoms: N40.0

## 2013-02-05 HISTORY — DX: Essential (primary) hypertension: I10

## 2013-02-05 HISTORY — DX: Unspecified intellectual disabilities: F79

## 2013-02-05 MED ORDER — TETANUS-DIPHTH-ACELL PERTUSSIS 5-2.5-18.5 LF-MCG/0.5 IM SUSP
0.5000 mL | Freq: Once | INTRAMUSCULAR | Status: AC
  Administered 2013-02-05: 0.5 mL via INTRAMUSCULAR
  Filled 2013-02-05: qty 0.5

## 2013-02-05 NOTE — ED Provider Notes (Signed)
Medical screening examination/treatment/procedure(s) were conducted as a shared visit with non-physician practitioner(s) and myself.  I personally evaluated the patient during the encounter  67 yo male with mechanical fall at group home.  No LOC.  Small laceration to forehead will need repair.  Baseline mental status per group home staff.  CT head and C Spine.  No other apparent injuries.   Candyce Churn, MD 02/08/13 [phone]

## 2013-02-05 NOTE — ED Notes (Signed)
Per EMS- Patient lives in a group home. Patient slipped and fell in the bathroom hitting his head on the toilet. Patient has a laceration to the right forehead. bandage applied. Patient denies any pain.

## 2013-02-05 NOTE — ED Notes (Signed)
JYN:[id number]<AO> Expected date:<BR> Expected time:<BR> Means of arrival:<BR> Comments:<BR> ems-group home-fall

## 2013-02-05 NOTE — ED Notes (Signed)
Patient transported to CT 

## 2013-02-05 NOTE — ED Notes (Signed)
Group home caretaker at the bedside.

## 2013-02-05 NOTE — ED Provider Notes (Signed)
History    CSN: [id number] Arrival date & time 02/05/13  3086  First MD Initiated Contact with Patient 02/05/13 [phone]     Chief Complaint  Patient presents with  . Fall  . Head Laceration   (Consider location/radiation/quality/duration/timing/severity/associated sxs/prior Treatment) Patient is a 67 y.o. male presenting with fall. The history is provided by the patient, a relative and a caregiver. No language interpreter was used.  Fall This is a new problem. The current episode started today. Pertinent negatives include no chest pain, chills, fever, headaches, nausea or neck pain. Associated symptoms comments: Patient with a history of mental retardation who resides in a group home fell while in the bathroom hitting his head against the toilet causing laceration to forehead. No LOC. He has not had any vomiting or mental status change. .   Past Medical History  Diagnosis Date  . Diabetes mellitus   . Urinary incontinence   . Mental retardation   . Schizophrenia   . BPH (benign prostatic hyperplasia)   . Thyroid disease   . Hypertension    Past Surgical History  Procedure Laterality Date  . Prostate surgery      x2, ?procedure   Family History  Problem Relation Age of Onset  . Arthritis Mother   . Arrhythmia Mother   . Lymphoma Mother     non-hodgkins  . Heart disease Father   . Stroke Father    History  Substance Use Topics  . Smoking status: Never Smoker   . Smokeless tobacco: Current User     Comment: chews tobacco  . Alcohol Use: No    Review of Systems  Constitutional: Negative for fever and chills.  HENT: Negative.  Negative for neck pain.   Eyes: Negative for visual disturbance.  Cardiovascular: Negative.  Negative for chest pain.  Gastrointestinal: Negative.  Negative for nausea.  Musculoskeletal: Negative.  Negative for back pain.  Skin: Positive for wound.  Neurological: Negative.  Negative for headaches.    Allergies  Review of patient's  allergies indicates no known allergies.  Home Medications   Current Outpatient Rx  Name  Route  Sig  Dispense  Refill  . aspirin 81 MG tablet   Oral   Take 81 mg by mouth daily.         Marland Kitchen desonide (DESOWEN) 0.05 % lotion   Topical   Apply 1 application topically 3 (three) times a week.         . diphenhydrAMINE (BENADRYL) 25 mg capsule   Oral   Take 25 mg by mouth at bedtime.         . dutasteride (AVODART) 0.5 MG capsule   Oral   Take 0.5 mg by mouth daily. Do Not Crush         . FLUoxetine (PROZAC) 20 MG capsule   Oral   Take 20 mg by mouth daily.         . hydrocortisone valerate cream (WESTCORT) 0.2 %   Topical   Apply 1 application topically 2 (two) times daily.         Marland Kitchen ketoconazole (NIZORAL) 2 % cream   Topical   Apply 1 application topically 2 (two) times daily. For face         . levothyroxine (SYNTHROID, LEVOTHROID) 50 MCG tablet   Oral   Take 1 tablet (50 mcg total) by mouth daily.   30 tablet   2   . QUEtiapine (SEROQUEL) 50 MG tablet   Oral  Take 50 mg by mouth 2 (two) times daily.         Marland Kitchen TAMSULOSIN HCL PO   Oral   Take 1 tablet by mouth every morning.         . thioridazine (MELLARIL) 100 MG tablet   Oral   Take 2 tablets (200 mg total) by mouth at bedtime.   30 tablet   10   . thioridazine (MELLARIL) 50 MG tablet   Oral   Take 1 tablet (50 mg total) by mouth every morning.   30 tablet   10   . topiramate (TOPAMAX) 25 MG tablet   Oral   Take 1 tablet (25 mg total) by mouth 2 (two) times daily.   60 tablet   10    BP 135/67  Pulse 76  Temp(Src) 97.7 F (36.5 C) (Oral)  Resp 16  SpO2 96% Physical Exam  Constitutional: He is oriented to person, place, and time. He appears well-developed and well-nourished.  HENT:  Head: Normocephalic and atraumatic.  Eyes: EOM are normal. Pupils are equal, round, and reactive to light.  Neck: Normal range of motion.  Cardiovascular: Normal rate and regular rhythm.   No  murmur heard. Pulmonary/Chest: Effort normal and breath sounds normal. He has no wheezes. He has no rales.  Abdominal: Soft. There is no tenderness.  Neurological: He is alert and oriented to person, place, and time. He has normal strength and normal reflexes. No sensory deficit. He displays a negative Romberg sign.  Cranial nerves 3-12 grossly intact. He follows commands well. Ambulatory without difficulty.  Skin:  3 cm stellate laceration left forehead without active bleeding. No hematoma.   Psychiatric: He has a normal mood and affect.    ED Course  Procedures (including critical care time) Labs Reviewed - No data to display LACERATION REPAIR Performed by: Elpidio Anis A Authorized by: Elpidio Anis A Consent: Verbal consent obtained. Risks and benefits: risks, benefits and alternatives were discussed Consent given by: patient Patient identity confirmed: provided demographic data Prepped and Draped in normal sterile fashion Wound explored  Laceration Location: right forehead  Laceration Length: 3cm, stellate wound borders  No Foreign Bodies seen or palpated  Anesthesia: local infiltration  Local anesthetic: lidocaine 2% w/ epinephrine  Anesthetic total: 1 ml  Irrigation method: syringe Amount of cleaning: standard  Skin closure: subcutaneous suture with 6-0 Vicryl x 1, dermabond closure  Number of sutures: 1  Technique: simple interrupted  Patient tolerance: Patient tolerated the procedure well with no immediate complications.    Ct Head Wo Contrast  02/05/2013   *RADIOLOGY REPORT*  Clinical Data:  Larey Seat striking right frontal region, laceration, past history hypertension, diabetes, mental retardation, schizophrenia  CT HEAD WITHOUT CONTRAST CT CERVICAL SPINE WITHOUT CONTRAST  Technique:  Multidetector CT imaging of the head and cervical spine was performed following the standard protocol without intravenous contrast.  Multiplanar CT image reconstructions of the  cervical spine were also generated.  Comparison:  None  CT HEAD  Findings: Mild motion artifacts. Normal ventricular morphology. No midline shift or mass effect. Normal appearance of brain parenchyma. No intracranial hemorrhage, mass lesion or evidence of acute infarction. No extra-axial fluid collections. Small lateral right frontal scalp hematoma. No definite focal bony or sinus abnormality.  IMPRESSION: No acute intracranial abnormalities identified with mild limitations of study secondary to motion.  CT CERVICAL SPINE  Findings: Visualized skull base intact. Scattered carotid atherosclerotic calcifications. Bones appear demineralized. Prevertebral soft tissues normal thickness. Disc space narrowing  with endplate spur formation C5-C6 and C6-C7. No acute fracture, subluxation or bone destruction. Multilevel facet degenerative changes. Lung apices clear.  IMPRESSION: Degenerative disc and facet disease changes of the cervical spine. No acute abnormalities.   Original Report Authenticated By: Ulyses Southward, M.D.   Ct Cervical Spine Wo Contrast  02/05/2013   *RADIOLOGY REPORT*  Clinical Data:  Larey Seat striking right frontal region, laceration, past history hypertension, diabetes, mental retardation, schizophrenia  CT HEAD WITHOUT CONTRAST CT CERVICAL SPINE WITHOUT CONTRAST  Technique:  Multidetector CT imaging of the head and cervical spine was performed following the standard protocol without intravenous contrast.  Multiplanar CT image reconstructions of the cervical spine were also generated.  Comparison:  None  CT HEAD  Findings: Mild motion artifacts. Normal ventricular morphology. No midline shift or mass effect. Normal appearance of brain parenchyma. No intracranial hemorrhage, mass lesion or evidence of acute infarction. No extra-axial fluid collections. Small lateral right frontal scalp hematoma. No definite focal bony or sinus abnormality.  IMPRESSION: No acute intracranial abnormalities identified with mild  limitations of study secondary to motion.  CT CERVICAL SPINE  Findings: Visualized skull base intact. Scattered carotid atherosclerotic calcifications. Bones appear demineralized. Prevertebral soft tissues normal thickness. Disc space narrowing with endplate spur formation C5-C6 and C6-C7. No acute fracture, subluxation or bone destruction. Multilevel facet degenerative changes. Lung apices clear.  IMPRESSION: Degenerative disc and facet disease changes of the cervical spine. No acute abnormalities.   Original Report Authenticated By: Ulyses Southward, M.D.   No diagnosis found. 1. Fall 2. Laceration forehead MDM  Patient had mechanical fall in the presence of care takers with no LOC, subsequent nausea and has negative Head CT, no cervical fractures. He has remained stable from mental status standpoint. Laceration repaired. Stable for discharge.   Arnoldo Hooker, PA-C 02/09/13 1451

## 2013-02-08 DIAGNOSIS — R32 Unspecified urinary incontinence: Secondary | ICD-10-CM | POA: Diagnosis not present

## 2013-02-08 DIAGNOSIS — N139 Obstructive and reflux uropathy, unspecified: Secondary | ICD-10-CM | POA: Diagnosis not present

## 2013-02-10 NOTE — ED Provider Notes (Signed)
Medical screening examination/treatment/procedure(s) were conducted as a shared visit with non-physician practitioner(s) and myself.  I personally evaluated the patient during the encounter.   Please see my separate note.    Candyce Churn, MD 02/10/13 [phone]

## 2013-03-10 ENCOUNTER — Other Ambulatory Visit: Payer: Self-pay | Admitting: Family

## 2013-03-12 NOTE — Telephone Encounter (Signed)
I do not see pharbedryl on pt's current med list. Left message on caretaker's voicemail to clarify request and let us know why request is being made.

## 2013-03-15 NOTE — Telephone Encounter (Signed)
Pharbedryl is antihistamine for benadryl. Med is listed as diphenhydramine on pt's med list. Refills sent for pharbedryl and levothyroxine.

## 2013-03-22 DIAGNOSIS — N390 Urinary tract infection, site not specified: Secondary | ICD-10-CM | POA: Diagnosis not present

## 2013-03-22 DIAGNOSIS — N139 Obstructive and reflux uropathy, unspecified: Secondary | ICD-10-CM | POA: Diagnosis not present

## 2013-03-23 ENCOUNTER — Encounter: Payer: Self-pay | Admitting: Family

## 2013-03-23 ENCOUNTER — Ambulatory Visit (INDEPENDENT_AMBULATORY_CARE_PROVIDER_SITE_OTHER): Payer: Medicare Other | Admitting: Family

## 2013-03-23 VITALS — BP 104/60 | HR 71 | Temp 97.9°F | Resp 16 | Ht 68.5 in | Wt 193.1 lb

## 2013-03-23 DIAGNOSIS — Z1211 Encounter for screening for malignant neoplasm of colon: Secondary | ICD-10-CM

## 2013-03-23 DIAGNOSIS — N400 Enlarged prostate without lower urinary tract symptoms: Secondary | ICD-10-CM

## 2013-03-23 DIAGNOSIS — E119 Type 2 diabetes mellitus without complications: Secondary | ICD-10-CM | POA: Diagnosis not present

## 2013-03-23 DIAGNOSIS — Z0000 Encounter for general adult medical examination without abnormal findings: Secondary | ICD-10-CM

## 2013-03-23 DIAGNOSIS — IMO0001 Reserved for inherently not codable concepts without codable children: Secondary | ICD-10-CM

## 2013-03-23 DIAGNOSIS — Z23 Encounter for immunization: Secondary | ICD-10-CM | POA: Diagnosis not present

## 2013-03-23 DIAGNOSIS — K625 Hemorrhage of anus and rectum: Secondary | ICD-10-CM | POA: Diagnosis not present

## 2013-03-23 DIAGNOSIS — I10 Essential (primary) hypertension: Secondary | ICD-10-CM

## 2013-03-23 DIAGNOSIS — E039 Hypothyroidism, unspecified: Secondary | ICD-10-CM | POA: Diagnosis not present

## 2013-03-23 LAB — CBC WITH DIFFERENTIAL/PLATELET
Basophils Absolute: 0.1 10*3/uL (ref 0.0–0.1)
HCT: 41.4 % (ref 39.0–52.0)
Hemoglobin: 14.6 g/dL (ref 13.0–17.0)
Lymphocytes Relative: 15 % (ref 12–46)
Monocytes Absolute: 0.6 10*3/uL (ref 0.1–1.0)
Monocytes Relative: 6 % (ref 3–12)
Neutro Abs: 6 10*3/uL (ref 1.7–7.7)
RBC: 4.39 MIL/uL (ref 4.22–5.81)
WBC: 9.4 10*3/uL (ref 4.0–10.5)

## 2013-03-23 LAB — HEMOGLOBIN A1C
Hgb A1c MFr Bld: 5.4 % (ref <5.7)
Mean Plasma Glucose: 108 mg/dL (ref <117)

## 2013-03-23 LAB — BASIC METABOLIC PANEL
Chloride: 102 mEq/L (ref 96–112)
Creat: 1.08 mg/dL (ref 0.50–1.35)
Potassium: 4.3 mEq/L (ref 3.5–5.3)

## 2013-03-23 NOTE — Addendum Note (Signed)
Addended by: Mervin Kung A on: 03/23/2013 11:45 AM   Modules accepted: Orders

## 2013-03-23 NOTE — Assessment & Plan Note (Signed)
He is not on antihypertensive.  BP on low side.  Monitor off meds.

## 2013-03-23 NOTE — Assessment & Plan Note (Signed)
Obtain follow up TSH.  

## 2013-03-23 NOTE — Assessment & Plan Note (Signed)
Will check cbc and ifob due to question of rectal bleeding.  Sister is resistant to colonoscopy as she does not feel that the pt can handle the prep.  She may consider though if IFOB is positive. Declines dexa, agreeable to flu shot today. We discussed high dose versus traditional flu shot and she wishes to continue with traditional.

## 2013-03-23 NOTE — Assessment & Plan Note (Signed)
Check A1C, bmet.  Caregiver will collect urine from microalbumin next week and return to the lab.

## 2013-03-23 NOTE — Patient Instructions (Addendum)
Please complete lab work prior to leaving. Collect and return via mail stool specimen to check for blood in stool.  Collect and return urine specimen to the lab next week. Please follow up in 3 months.

## 2013-03-23 NOTE — Assessment & Plan Note (Signed)
management per urology- clinically stable.  We discussed that flomax can be associated with increased falls risk in elderly, but he has been on this for some time. Advised that they discuss this with urology at the upcoming visit.  We also discussed that benadryl has the potential to increase fall risk, however sister and caregiver think it is helping him to not get up in the middle of the night and wish to continue.

## 2013-03-23 NOTE — Progress Notes (Signed)
  Subjective:    Patient ID: Edward [name], male    DOB: [birth year], 67 y.o.   MRN: [id number]  HPI  Subjective:   Patient here for Medicare annual wellness visit and management of other chronic and acute problems.  Pt presents today with his sister who is POA and his caregiver from his group home.  Declines bone density, had zostavax remotely.    DM2- Sugars have been running max 192, generally in the low 100's.  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 Due for tsh.  Hyperlipidemia- LDL at goal 08/24/12.  HTN- currently on diet alone.    BPH- No changes in urinary pattern/symptoms.  Caregiver notes that there was blood in his depends. Not clear if it was rectal bleeding or if it was urinary.  He had urinalysis/culture performed yesterday at [facility] urology which they are waiting to hear back on.  Risk factors:  See HPI  Roster of Physicians Providing Medical Care to Patient: Dr. Brunilda Payor at [facility]. Kathrin Greathouse- for psych meds  Activities of Daily Living  In your present state of health, do you have any difficulty performing the following activities? Preparing food and eating?: yes Bathing yourself: yes Getting dressed: yes Using the toilet: yes Moving around from place to place: yes In the past year have you fallen or had a near fall?:yes  Home Safety: Has smoke detector and wears seat belts. No firearms. No excess sun exposure.  Diet and Exercise  Current exercise habits: some walking- but pt dislikes Dietary issues discussed: healthy diet   Depression Screen  (Note: if answer to either of the following is "Yes", then a more complete depression screening is indicated)  Q1: Over the past two weeks, have you felt down, depressed or hopeless?no  Q2: Over the past two weeks, have you felt little interest or pleasure in doing things? no   The following portions of the patient's history were reviewed and updated as appropriate: allergies, current medications, past family  history, past medical history, past social history, past surgical history and problem list.    Objective:   Vision: deferred- he has yearly eye exams, declines to wear glasses Hearing:  Impaired.  Sister declines referral to audiology Body mass index: see HPI Cognitive Impairment Assessment: cognition, memory and judgment appear normal.   Assessment:   Medicare wellness utd on preventive parameters   Plan:   During the course of the visit the patient was educated and counseled about appropriate screening and preventive services including:  Fall prevention   IFOB   Vaccines / LABS Flu shot today Patient Instructions (the written plan) was given to the patient.      Review of Systems     Objective:   Physical Exam  Constitutional: He is oriented to person, place, and time. He appears well-developed and well-nourished. No distress.  HENT:  Right Ear: Tympanic membrane and ear canal normal.  Left Ear: Tympanic membrane and ear canal normal.  Mouth/Throat: No oropharyngeal exudate, posterior oropharyngeal edema or posterior oropharyngeal erythema.  Cardiovascular: Normal rate and regular rhythm.   No murmur heard. Pulmonary/Chest: Effort normal and breath sounds normal. No respiratory distress. He has no wheezes. He has no rales. He exhibits no tenderness.  Musculoskeletal: He exhibits no edema.  Neurological: He is alert and oriented to person, place, and time.  Psychiatric:  Happy appearing elderly white male.           Assessment & Plan:

## 2013-03-25 ENCOUNTER — Encounter: Payer: Self-pay | Admitting: Family

## 2013-03-27 ENCOUNTER — Telehealth: Payer: Self-pay | Admitting: Family

## 2013-03-27 NOTE — Telephone Encounter (Signed)
Spoke with the pt's sister and she asked if I would call Ms. Monthque at the Group Home and let her know the form is ready for pick-up.  Called Ms. Monthque and informed her that the form is ready for pick-up and she stated that she will be by to pick it up.//AB/CMA

## 2013-03-27 NOTE — Telephone Encounter (Signed)
Please call pt's sister and let her know that Richar's form is completed and ready for pick up.

## 2013-04-23 ENCOUNTER — Ambulatory Visit: Payer: Self-pay | Admitting: Family

## 2013-04-24 ENCOUNTER — Ambulatory Visit (INDEPENDENT_AMBULATORY_CARE_PROVIDER_SITE_OTHER): Payer: Medicare Other | Admitting: Family

## 2013-04-24 ENCOUNTER — Ambulatory Visit (HOSPITAL_BASED_OUTPATIENT_CLINIC_OR_DEPARTMENT_OTHER)
Admission: RE | Admit: 2013-04-24 | Discharge: 2013-04-24 | Disposition: A | Discharge status: Home / Self Care | Payer: Medicare Other | Source: Ambulatory Visit | Attending: Family | Admitting: Family

## 2013-04-24 ENCOUNTER — Encounter: Payer: Self-pay | Admitting: Family

## 2013-04-24 VITALS — BP 110/62 | HR 75 | Temp 97.6°F | Resp 18 | Ht 68.5 in | Wt 194.1 lb

## 2013-04-24 DIAGNOSIS — M7989 Other specified soft tissue disorders: Secondary | ICD-10-CM | POA: Diagnosis not present

## 2013-04-24 DIAGNOSIS — S8990XA Unspecified injury of unspecified lower leg, initial encounter: Secondary | ICD-10-CM | POA: Diagnosis not present

## 2013-04-24 DIAGNOSIS — M25569 Pain in unspecified knee: Secondary | ICD-10-CM | POA: Diagnosis not present

## 2013-04-24 DIAGNOSIS — M25561 Pain in right knee: Secondary | ICD-10-CM | POA: Insufficient documentation

## 2013-04-24 DIAGNOSIS — M1710 Unilateral primary osteoarthritis, unspecified knee: Secondary | ICD-10-CM | POA: Insufficient documentation

## 2013-04-24 DIAGNOSIS — F202 Catatonic schizophrenia: Secondary | ICD-10-CM | POA: Diagnosis not present

## 2013-04-24 DIAGNOSIS — F70 Mild intellectual disabilities: Secondary | ICD-10-CM | POA: Diagnosis not present

## 2013-04-24 NOTE — Progress Notes (Signed)
Subjective:    Patient ID: Edward [name], male    DOB: 09-08-1945, 67 y.o.   MRN: [id number]  HPI  [name] is a 67 yr old male who presents today with his aid from his group home with chief complaint of right knee pain.  Per Aid, he lost his balance in the kitchen over the weekend and fell onto his right knee.  Since that time he has had swelling and soreness of the right knee.  Review of Systems See HPI  Past Medical History  Diagnosis Date  . Diabetes mellitus   . Urinary incontinence   . Mental retardation   . Schizophrenia   . BPH (benign prostatic hyperplasia)   . Thyroid disease   . Hypertension     History   Social History  . Marital Status: Single    Spouse Name: N/A    Number of Children: N/A  . Years of Education: N/A   Occupational History  . Not on file.   Social History Main Topics  . Smoking status: Never Smoker   . Smokeless tobacco: Current User     Comment: chews tobacco  . Alcohol Use: No  . Drug Use: No  . Sexual Activity: Not on file   Other Topics Concern  . Not on file   Social History Narrative   Lives in a group home   [name] is his legal guardian.   Sister and her family live locally    Past Surgical History  Procedure Laterality Date  . Prostate surgery      x2, ?procedure    Family History  Problem Relation Age of Onset  . Arthritis Mother   . Arrhythmia Mother   . Lymphoma Mother     non-hodgkins  . Heart disease Father   . Stroke Father     No Known Allergies  Current Outpatient Prescriptions on File Prior to Visit  Medication Sig Dispense Refill  . Acetaminophen (ACETAMINOPHEN EXTRA STRENGTH) 167 MG/5ML LIQD Take 15 mLs by mouth.      Marland Kitchen aspirin 81 MG tablet Take 81 mg by mouth daily.      Marland Kitchen desonide (DESOWEN) 0.05 % lotion Apply 1 application topically 3 (three) times a week.      . dutasteride (AVODART) 0.5 MG capsule Take 0.5 mg by mouth daily. Do Not Crush      . Emollient (CERAVE) CREA Apply 1  application topically. For dry skin on feet and legs      . FLUoxetine (PROZAC) 20 MG capsule Take 20 mg by mouth daily.      . hydrocortisone valerate cream (WESTCORT) 0.2 % Apply 1 application topically 2 (two) times daily.      Marland Kitchen ketoconazole (NIZORAL) 2 % cream Apply 1 application topically 2 (two) times daily. For face      . PHARBEDRYL 25 MG capsule TAKE ONE CAPSULE BY MOUTH AT BEDTIME  30 capsule  3  . QUEtiapine (SEROQUEL) 50 MG tablet Take 50 mg by mouth 2 (two) times daily.      Marland Kitchen TAMSULOSIN HCL PO Take 1 tablet by mouth every morning.      . thioridazine (MELLARIL) 100 MG tablet Take 2 tablets (200 mg total) by mouth at bedtime.  30 tablet  10  . thioridazine (MELLARIL) 50 MG tablet Take 1 tablet (50 mg total) by mouth every morning.  30 tablet  10  . topiramate (TOPAMAX) 25 MG tablet Take 1 tablet (25 mg total) by  mouth 2 (two) times daily.  60 tablet  10   No current facility-administered medications on file prior to visit.    BP 110/62  Pulse 75  Temp(Src) 97.6 F (36.4 C) (Oral)  Resp 18  Ht 5' 8.5" (1.74 m)  Wt 194 lb 1.3 oz (88.034 kg)  BMI 29.08 kg/m2  SpO2 98%       Objective:   Physical Exam  Constitutional: He is oriented to person, place, and time. He appears well-developed and well-nourished. No distress.  Cardiovascular: Normal rate and regular rhythm.   No murmur heard. Pulmonary/Chest: Effort normal and breath sounds normal. No respiratory distress. He has no wheezes. He has no rales. He exhibits no tenderness.  Musculoskeletal:  Mild swelling of the right knee, mild bruising noted.    Neurological: He is alert and oriented to person, place, and time.          Assessment & Plan:

## 2013-04-24 NOTE — Patient Instructions (Addendum)
Please complete x ray on the first floor. We will contact you with results.

## 2013-04-24 NOTE — Assessment & Plan Note (Signed)
Will obtain R knee x ray.  Further recommendations pending x ray results.

## 2013-05-03 DIAGNOSIS — N139 Obstructive and reflux uropathy, unspecified: Secondary | ICD-10-CM | POA: Diagnosis not present

## 2013-05-31 DIAGNOSIS — E119 Type 2 diabetes mellitus without complications: Secondary | ICD-10-CM | POA: Diagnosis not present

## 2013-05-31 DIAGNOSIS — H2510 Age-related nuclear cataract, unspecified eye: Secondary | ICD-10-CM | POA: Diagnosis not present

## 2013-06-07 ENCOUNTER — Encounter: Payer: Self-pay | Admitting: Physician Assistant

## 2013-06-07 ENCOUNTER — Ambulatory Visit (INDEPENDENT_AMBULATORY_CARE_PROVIDER_SITE_OTHER): Payer: Medicare Other | Admitting: Physician Assistant

## 2013-06-07 ENCOUNTER — Ambulatory Visit: Payer: Self-pay | Admitting: Physician Assistant

## 2013-06-07 VITALS — BP 116/64 | HR 73 | Wt 203.2 lb

## 2013-06-07 DIAGNOSIS — R300 Dysuria: Secondary | ICD-10-CM

## 2013-06-07 DIAGNOSIS — N390 Urinary tract infection, site not specified: Secondary | ICD-10-CM | POA: Diagnosis not present

## 2013-06-07 LAB — BASIC METABOLIC PANEL
BUN: 17 mg/dL (ref 6–23)
Chloride: 99 mEq/L (ref 96–112)
Potassium: 4.4 mEq/L (ref 3.5–5.3)

## 2013-06-07 LAB — CBC WITH DIFFERENTIAL/PLATELET
Eosinophils Absolute: 1.5 10*3/uL — ABNORMAL HIGH (ref 0.0–0.7)
Eosinophils Relative: 18 % — ABNORMAL HIGH (ref 0–5)
HCT: 40.6 % (ref 39.0–52.0)
Lymphs Abs: 1.5 10*3/uL (ref 0.7–4.0)
MCH: 33.6 pg (ref 26.0–34.0)
MCV: 95.3 fL (ref 78.0–100.0)
Monocytes Absolute: 0.5 10*3/uL (ref 0.1–1.0)
Monocytes Relative: 6 % (ref 3–12)
Platelets: 233 10*3/uL (ref 150–400)
RBC: 4.26 MIL/uL (ref 4.22–5.81)

## 2013-06-07 LAB — POCT URINALYSIS DIPSTICK
Blood, UA: NEGATIVE
Nitrite, UA: NEGATIVE
Protein, UA: NEGATIVE
pH, UA: 6.5

## 2013-06-07 MED ORDER — SULFAMETHOXAZOLE-TMP DS 800-160 MG PO TABS: 1.0000 | ORAL_TABLET | Freq: Two times a day (BID) | ORAL | Status: DC

## 2013-06-07 NOTE — Assessment & Plan Note (Signed)
Urine dipstick with large LEs.  Will send for micro and culture.  Will empirically treat with Rx Bactrim.  Increase fluid intake.  Cranberry supplement.  Return if symptoms not improving.  ER if signs of confusion occur.

## 2013-06-07 NOTE — Progress Notes (Signed)
Pre visit review using our clinic review tool, if applicable. No additional management support is needed unless otherwise documented below in the visit note. 

## 2013-06-07 NOTE — Patient Instructions (Signed)
Increase fluid intake.  Cranberry supplement.  Take antibiotic as prescribed until all tablets are gone.  If he develops symptoms of fever, nausea, vomiting or confusion, he needs to be taken to the ER.

## 2013-06-07 NOTE — Progress Notes (Signed)
Patient ID: Edward [name], male   DOB: 12-12-1945, 67 y.o.   MRN: [id number]  Patient presents to clinic today w/ caretaker c/o episode of mild back pain x 1 this am.  States pain was sharp and lasted a few minutes.  Pain not worsened with movement or position change.  Pain was mostly in left side of lower back.  Denies nausea, vomiting, dysuria.  Patient has history of UTI.  Caretaker was concerned patient may be getting a urinary infection.  Denies fever or mental status change.  Denies gait disturbance.  Patient feeling well at time of interview.   Past Medical History  Diagnosis Date  . Diabetes mellitus   . Urinary incontinence   . Mental retardation   . Schizophrenia   . BPH (benign prostatic hyperplasia)   . Thyroid disease   . Hypertension     Current Outpatient Prescriptions on File Prior to Visit  Medication Sig Dispense Refill  . Acetaminophen (ACETAMINOPHEN EXTRA STRENGTH) 167 MG/5ML LIQD Take 15 mLs by mouth.      Marland Kitchen aspirin 81 MG tablet Take 81 mg by mouth daily.      Marland Kitchen desonide (DESOWEN) 0.05 % lotion Apply 1 application topically 3 (three) times a week.      . dutasteride (AVODART) 0.5 MG capsule Take 0.5 mg by mouth daily. Do Not Crush      . Emollient (CERAVE) CREA Apply 1 application topically. For dry skin on feet and legs      . FLUoxetine (PROZAC) 20 MG capsule Take 20 mg by mouth daily.      . hydrocortisone valerate cream (WESTCORT) 0.2 % Apply 1 application topically 2 (two) times daily.      Marland Kitchen ketoconazole (NIZORAL) 2 % cream Apply 1 application topically 2 (two) times daily. For face      . levothyroxine (SYNTHROID, LEVOTHROID) 50 MCG tablet       . PHARBEDRYL 25 MG capsule TAKE ONE CAPSULE BY MOUTH AT BEDTIME  30 capsule  3  . QUEtiapine (SEROQUEL) 50 MG tablet Take 50 mg by mouth 2 (two) times daily.      Marland Kitchen TAMSULOSIN HCL PO Take 1 tablet by mouth every morning.      . thioridazine (MELLARIL) 100 MG tablet Take 2 tablets (200 mg total) by mouth at bedtime.   30 tablet  10  . thioridazine (MELLARIL) 50 MG tablet Take 1 tablet (50 mg total) by mouth every morning.  30 tablet  10  . topiramate (TOPAMAX) 25 MG tablet Take 1 tablet (25 mg total) by mouth 2 (two) times daily.  60 tablet  10   No current facility-administered medications on file prior to visit.    No Known Allergies  Family History  Problem Relation Age of Onset  . Arthritis Mother   . Arrhythmia Mother   . Lymphoma Mother     non-hodgkins  . Heart disease Father   . Stroke Father     History   Social History  . Marital Status: Single    Spouse Name: N/A    Number of Children: N/A  . Years of Education: N/A   Social History Main Topics  . Smoking status: Never Smoker   . Smokeless tobacco: Current User     Comment: chews tobacco  . Alcohol Use: No  . Drug Use: No  . Sexual Activity: Not on file   Other Topics Concern  . Not on file   Social History Narrative   Lives  in a group home   [name] is his legal guardian.   Sister and her family live locally   ROS See HPI.  All other ROS are negative.   Filed Vitals:   06/07/13 1312  BP: 116/64  Pulse: 73   Physical Exam  Constitutional: He is oriented to person, place, and time and well-developed, well-nourished, and in no distress.  HENT:  Head: Normocephalic and atraumatic.  Eyes: Conjunctivae are normal.  Neck: Normal range of motion. Neck supple.  Cardiovascular: Normal rate, regular rhythm and normal heart sounds.   Pulmonary/Chest: Effort normal and breath sounds normal. No respiratory distress. He has no wheezes. He has no rales.  Abdominal: Soft. Bowel sounds are normal. He exhibits no distension and no mass. There is no tenderness. There is no rebound and no guarding.  Musculoskeletal: Normal range of motion. He exhibits no edema and no tenderness.  Neurological: He is alert and oriented to person, place, and time. No cranial nerve deficit. Gait normal.  Skin: Skin is warm and dry. No rash  noted.  Psychiatric: Affect normal.    Recent Results (from the past 2160 hour(s))  CBC WITH DIFFERENTIAL     Status: Abnormal   Collection Time    03/23/13 11:26 AM      Result Value Range   WBC 9.4  4.0 - 10.5 K/uL   RBC 4.39  4.22 - 5.81 MIL/uL   Hemoglobin 14.6  13.0 - 17.0 g/dL   HCT 45.4  09.8 - 11.9 %   MCV 94.3  78.0 - 100.0 fL   MCH 33.3  26.0 - 34.0 pg   MCHC 35.3  30.0 - 36.0 g/dL   RDW 14.7  82.9 - 56.2 %   Platelets 223  150 - 400 K/uL   Neutrophils Relative % 63  43 - 77 %   Neutro Abs 6.0  1.7 - 7.7 K/uL   Lymphocytes Relative 15  12 - 46 %   Lymphs Abs 1.4  0.7 - 4.0 K/uL   Monocytes Relative 6  3 - 12 %   Monocytes Absolute 0.6  0.1 - 1.0 K/uL   Eosinophils Relative 15 (*) 0 - 5 %   Eosinophils Absolute 1.4 (*) 0.0 - 0.7 K/uL   Basophils Relative 1  0 - 1 %   Basophils Absolute 0.1  0.0 - 0.1 K/uL   Smear Review Criteria for review not met    TSH     Status: None   Collection Time    03/23/13 11:26 AM      Result Value Range   TSH 3.164  0.350 - 4.500 uIU/mL  HEMOGLOBIN A1C     Status: None   Collection Time    03/23/13 11:26 AM      Result Value Range   Hemoglobin A1C 5.4  <5.7 %   Comment:                                                                            According to the ADA Clinical Practice Recommendations for 2011, when     HbA1c is used as a screening test:             >=6.5%  Diagnostic of Diabetes Mellitus                (if abnormal result is confirmed)           5.7-6.4%   Increased risk of developing Diabetes Mellitus           References:Diagnosis and Classification of Diabetes Mellitus,Diabetes     Care,2011,34(Suppl 1):S62-S69 and Standards of Medical Care in             Diabetes - 2011,Diabetes Care,2011,34 (Suppl 1):S11-S61.         Mean Plasma Glucose 108  <117 mg/dL  BASIC METABOLIC PANEL     Status: Abnormal   Collection Time    03/23/13 11:26 AM      Result Value Range   Sodium 136  135 - 145 mEq/L   Potassium  4.3  3.5 - 5.3 mEq/L   Chloride 102  96 - 112 mEq/L   CO2 28  19 - 32 mEq/L   Glucose, Bld 117 (*) 70 - 99 mg/dL   BUN 17  6 - 23 mg/dL   Creat 1.61  0.96 - 0.45 mg/dL   Calcium 9.0  8.4 - 40.9 mg/dL  POCT URINALYSIS DIPSTICK     Status: None   Collection Time    06/07/13  2:22 PM      Result Value Range   Color, UA Yellow     Clarity, UA Cloudy     Glucose, UA Negative     Bilirubin, UA Neagtive     Ketones, UA Negative     Spec Grav, UA 1.015     Blood, UA Negative     pH, UA 6.5     Protein, UA Negative     Urobilinogen, UA 0.2     Nitrite, UA Negative     Leukocytes, UA large (3+)      Assessment/Plan: UTI (urinary tract infection) Urine dipstick with large LEs.  Will send for micro and culture.  Will empirically treat with Rx Bactrim.  Increase fluid intake.  Cranberry supplement.  Return if symptoms not improving.  ER if signs of confusion occur.

## 2013-06-08 LAB — URINALYSIS, MICROSCOPIC ONLY: Casts: NONE SEEN

## 2013-06-14 DIAGNOSIS — N401 Enlarged prostate with lower urinary tract symptoms: Secondary | ICD-10-CM | POA: Diagnosis not present

## 2013-06-14 DIAGNOSIS — N400 Enlarged prostate without lower urinary tract symptoms: Secondary | ICD-10-CM | POA: Diagnosis not present

## 2013-06-14 DIAGNOSIS — N139 Obstructive and reflux uropathy, unspecified: Secondary | ICD-10-CM | POA: Diagnosis not present

## 2013-07-20 ENCOUNTER — Other Ambulatory Visit: Payer: Self-pay | Admitting: Family

## 2013-07-27 DIAGNOSIS — N401 Enlarged prostate with lower urinary tract symptoms: Secondary | ICD-10-CM | POA: Diagnosis not present

## 2013-07-27 DIAGNOSIS — N139 Obstructive and reflux uropathy, unspecified: Secondary | ICD-10-CM | POA: Diagnosis not present

## 2013-08-29 DIAGNOSIS — L259 Unspecified contact dermatitis, unspecified cause: Secondary | ICD-10-CM | POA: Diagnosis not present

## 2013-09-04 DIAGNOSIS — N139 Obstructive and reflux uropathy, unspecified: Secondary | ICD-10-CM | POA: Diagnosis not present

## 2013-09-06 ENCOUNTER — Ambulatory Visit (INDEPENDENT_AMBULATORY_CARE_PROVIDER_SITE_OTHER): Payer: Medicare Other | Admitting: Nurse Practitioner

## 2013-09-06 ENCOUNTER — Encounter: Payer: Self-pay | Admitting: Nurse Practitioner

## 2013-09-06 VITALS — BP 128/74 | HR 74 | Temp 98.8°F | Ht 68.5 in | Wt 197.5 lb

## 2013-09-06 DIAGNOSIS — R062 Wheezing: Secondary | ICD-10-CM

## 2013-09-06 DIAGNOSIS — J189 Pneumonia, unspecified organism: Secondary | ICD-10-CM | POA: Diagnosis not present

## 2013-09-06 MED ORDER — ALBUTEROL SULFATE HFA 108 (90 BASE) MCG/ACT IN AERS: INHALATION_SPRAY | RESPIRATORY_TRACT | Status: DC

## 2013-09-06 MED ORDER — IPRATROPIUM-ALBUTEROL 0.5-2.5 (3) MG/3ML IN SOLN
3.0000 mL | RESPIRATORY_TRACT | Status: DC
  Administered 2013-09-06: 3 mL via RESPIRATORY_TRACT

## 2013-09-06 MED ORDER — DOXYCYCLINE HYCLATE 100 MG PO TABS: 100.0000 mg | ORAL_TABLET | Freq: Two times a day (BID) | ORAL | Status: DC

## 2013-09-06 NOTE — Patient Instructions (Signed)
I am treating you for pneumonia. Please start antibiotic. Eat yogurt daily at lunch time to help prevent diarrhea that can be caused by antibiotic. Use inhaler as prescribed. Perform breathing/cough exercises 3-4 times daily-blow up balloon to help break up congestion. Start sinus rinse (neilmed sinus rinse) daily. Use Sip fluids every hour to stay hydrated. Use ice chips or sips of water to keep mouth moist. Rest. Follow up 2 weeks or sooner if you feel worse.   Pneumonia, Adult Pneumonia is an infection of the lungs.  CAUSES Pneumonia may be caused by bacteria or a virus. Usually, these infections are caused by breathing infectious particles into the lungs (respiratory tract). SYMPTOMS   Cough.  Fever.  Chest pain.  Increased rate of breathing.  Wheezing.  Mucus production. DIAGNOSIS  If you have the common symptoms of pneumonia, your caregiver will typically confirm the diagnosis with a chest X-ray. The X-ray will show an abnormality in the lung (pulmonary infiltrate) if you have pneumonia. Other tests of your blood, urine, or sputum may be done to find the specific cause of your pneumonia. Your caregiver may also do tests (blood gases or pulse oximetry) to see how well your lungs are working. TREATMENT  Some forms of pneumonia may be spread to other people when you cough or sneeze. You may be asked to wear a mask before and during your exam. Pneumonia that is caused by bacteria is treated with antibiotic medicine. Pneumonia that is caused by the influenza virus may be treated with an antiviral medicine. Most other viral infections must run their course. These infections will not respond to antibiotics.  PREVENTION A pneumococcal shot (vaccine) is available to prevent a common bacterial cause of pneumonia. This is usually suggested for:  People over 31 years old.  Patients on chemotherapy.  People with chronic lung problems, such as bronchitis or emphysema.  People with immune  system problems. If you are over 65 or have a high risk condition, you may receive the pneumococcal vaccine if you have not received it before. In some countries, a routine influenza vaccine is also recommended. This vaccine can help prevent some cases of pneumonia.You may be offered the influenza vaccine as part of your care. If you smoke, it is time to quit. You may receive instructions on how to stop smoking. Your caregiver can provide medicines and counseling to help you quit. HOME CARE INSTRUCTIONS   Cough suppressants may be used if you are losing too much rest. However, coughing protects you by clearing your lungs. You should avoid using cough suppressants if you can.  Your caregiver may have prescribed medicine if he or she thinks your pneumonia is caused by a bacteria or influenza. Finish your medicine even if you start to feel better.  Your caregiver may also prescribe an expectorant. This loosens the mucus to be coughed up.  Only take over-the-counter or prescription medicines for pain, discomfort, or fever as directed by your caregiver.  Do not smoke. Smoking is a common cause of bronchitis and can contribute to pneumonia. If you are a smoker and continue to smoke, your cough may last several weeks after your pneumonia has cleared.  A cold steam vaporizer or humidifier in your room or home may help loosen mucus.  Coughing is often worse at night. Sleeping in a semi-upright position in a recliner or using a couple pillows under your head will help with this.  Get rest as you feel it is needed. Your body will usually  let you know when you need to rest. [place] IF:   Your illness becomes worse. This is especially true if you are elderly or weakened from any other disease.  You cannot control your cough with suppressants and are losing sleep.  You begin coughing up blood.  You develop pain which is getting worse or is uncontrolled with medicines.  You have a  fever.  Any of the symptoms which initially brought you in for treatment are getting worse rather than better.  You develop shortness of breath or chest pain. MAKE SURE YOU:   Understand these instructions.  Will watch your condition.  Will get help right away if you are not doing well or get worse. Document Released: 07/12/2005 Document Revised: 10/04/2011 Document Reviewed: 10/01/2010 [facility] Patient Information 2014 [place], [place].

## 2013-09-06 NOTE — Progress Notes (Signed)
Pre-visit discussion using our clinic review tool. No additional management support is needed unless otherwise documented below in the visit note.  

## 2013-09-06 NOTE — Progress Notes (Signed)
   Subjective:    Patient ID: Edward [name], male    DOB: [birth year], 68 y.o.   MRN: [id number]  Cough This is a new problem. The current episode started 1 to 4 weeks ago (1 wk). The problem has been gradually worsening. The problem occurs hourly. The cough is productive of sputum. Associated symptoms include nasal congestion and a sore throat. Pertinent negatives include no chest pain, chills, ear congestion, ear pain, fever, headaches, shortness of breath or wheezing. Nothing aggravates the symptoms. He has tried nothing for the symptoms. developmental delay, lives in group home, accompanied by caretaker      Review of Systems  Constitutional: Negative for fever, chills, activity change, appetite change and fatigue.  HENT: Positive for congestion (nasal & chest) and sore throat. Negative for ear pain.   Respiratory: Positive for cough. Negative for chest tightness, shortness of breath and wheezing.   Cardiovascular: Negative for chest pain.  Musculoskeletal: Negative for back pain.  Neurological: Negative for headaches.       Objective:   Physical Exam  Vitals reviewed. Constitutional: He is oriented to person, place, and time. He appears well-developed and well-nourished. No distress.  HENT:  Head: Normocephalic and atraumatic.  Right Ear: External ear normal.  Left Ear: External ear normal.  Buccal mucosa dry, posterior pharynx dry. Sniffles.  Eyes: Conjunctivae are normal. Right eye exhibits no discharge. Left eye exhibits no discharge.  Neck: Normal range of motion. Neck supple. No thyromegaly present.  Cardiovascular: Normal rate, regular rhythm and normal heart sounds.   No murmur heard. Pulmonary/Chest: Effort normal. No respiratory distress. He has wheezes (L Lung fields ). He has rales.  Lymphadenopathy:    He has no cervical adenopathy.  Neurological: He is alert and oriented to person, place, and time.  Skin: Skin is warm and dry.  Psychiatric: He has a normal  mood and affect. His behavior is normal. Thought content normal.          Assessment & Plan:  1. Wheeze Cleared after breathing treatment. BS dim bases bilat. - ipratropium-albuterol (DUONEB) 0.5-2.5 (3) MG/3ML nebulizer solution 3 mL; Take 3 mLs by nebulization every 4 (four) hours. - albuterol (PROVENTIL HFA;VENTOLIN HFA) 108 (90 BASE) MCG/ACT inhaler; 2 puffs BID for 5 days, then q6h PRN cough.  Dispense: 1 Inhaler; Refill: 0  2. CAP (community acquired pneumonia) - doxycycline (VIBRA-TABS) 100 MG tablet; Take 1 tablet (100 mg total) by mouth 2 (two) times daily.  Dispense: 28 tablet; Refill: 0 - albuterol (PROVENTIL HFA;VENTOLIN HFA) 108 (90 BASE) MCG/ACT inhaler; 2 puffs BID for 5 days, then q6h PRN cough.  Dispense: 1 Inhaler; Refill: 0  See pt instructions. Explained all to care giver. Answered all questions.

## 2013-09-17 ENCOUNTER — Encounter: Payer: Self-pay | Admitting: Family

## 2013-09-17 ENCOUNTER — Ambulatory Visit (INDEPENDENT_AMBULATORY_CARE_PROVIDER_SITE_OTHER): Payer: Medicare Other | Admitting: Family

## 2013-09-17 VITALS — BP 102/64 | HR 74 | Temp 97.8°F | Resp 18 | Ht 68.5 in | Wt 191.0 lb

## 2013-09-17 DIAGNOSIS — I10 Essential (primary) hypertension: Secondary | ICD-10-CM | POA: Diagnosis not present

## 2013-09-17 DIAGNOSIS — N400 Enlarged prostate without lower urinary tract symptoms: Secondary | ICD-10-CM

## 2013-09-17 DIAGNOSIS — R634 Abnormal weight loss: Secondary | ICD-10-CM

## 2013-09-17 DIAGNOSIS — E039 Hypothyroidism, unspecified: Secondary | ICD-10-CM | POA: Diagnosis not present

## 2013-09-17 NOTE — Assessment & Plan Note (Signed)
Patient follows with [facility] and is on flomax and avodart.

## 2013-09-17 NOTE — Progress Notes (Signed)
Pre visit review using our clinic review tool, if applicable. No additional management support is needed unless otherwise documented below in the visit note. 

## 2013-09-17 NOTE — Assessment & Plan Note (Signed)
Wt Readings from Last 3 Encounters:  09/17/13 191 lb (86.637 kg)  09/06/13 197 lb 8 oz (89.585 kg)  06/07/13 203 lb 4 oz (92.194 kg)   Patient was weighed on a different scale on 2/12. Patient is now currently more active than usual and is eating a healthier diet. Will continue to monitor.

## 2013-09-17 NOTE — Assessment & Plan Note (Signed)
Continues synthroid, obtain TSH. Has had some weight loss.  He is still overweight.  Recent illness, increased activity level, healthy diet at the home are all likely contributing.  I again discussed with the sister the small possibility of underlying malignancy as cause for weight loss and she declines further work up at this time.    Body mass index is 28.62 kg/(m^2).

## 2013-09-17 NOTE — Assessment & Plan Note (Addendum)
-

## 2013-09-17 NOTE — Progress Notes (Signed)
Subjective:    Patient ID: Edward [name], male    DOB: [birth year], 68 y.o.   MRN: [id number]  Diabetes Pertinent negatives for hypoglycemia include no headaches. Pertinent negatives for diabetes include no chest pain.  Hypertension Pertinent negatives include no chest pain, headaches or shortness of breath.  Hyperlipidemia Pertinent negatives include no chest pain or shortness of breath.  Pneumonia There is no cough or shortness of breath. Associated symptoms include rhinorrhea. Pertinent negatives include no appetite change, chest pain, fever or headaches.   Edward [name] is a 68 year old male who presents today for follow up.  CAP) Patient was seen on February 12th and diagnosed with community acquired pneumonia. Patient's nurse aid reports a decrease in symptoms associate with CAP. Denies cough and shortness of breath.  Diabetes)  Blood sugars are checked once daily 5 days per week and run 124 to 186.  Hyperlipidemia) Nurse aid reports healthy diet. Has had some additional weight loss. The sister and caregiver not recent decrease in portion intake.  Wt Readings from Last 3 Encounters:  09/17/13 191 lb (86.637 kg)  09/06/13 197 lb 8 oz (89.585 kg)  06/07/13 203 lb 4 oz (92.194 kg)    Hypertension) Currently does not take any medications  BP Readings from Last 3 Encounters:  09/17/13 102/64  09/06/13 128/74  06/07/13 116/64    BPH)  Patient followed by [facility] every six months. Denies any complaints.   Review of Systems  Constitutional: Positive for unexpected weight change. Negative for fever, activity change and appetite change.  HENT: Positive for rhinorrhea. Negative for congestion.   Respiratory: Negative for cough and shortness of breath.   Cardiovascular: Negative for chest pain.  Gastrointestinal: Negative for nausea and constipation.  Genitourinary: Negative for dysuria and difficulty urinating.  Neurological: Negative for headaches.    Psychiatric/Behavioral: Negative for behavioral problems and agitation.   Past Medical History  Diagnosis Date  . Diabetes mellitus   . Urinary incontinence   . Mental retardation   . Schizophrenia   . BPH (benign prostatic hyperplasia)   . Thyroid disease   . Hypertension     History   Social History  . Marital Status: Single    Spouse Name: N/A    Number of Children: N/A  . Years of Education: N/A   Occupational History  . Not on file.   Social History Main Topics  . Smoking status: Never Smoker   . Smokeless tobacco: Current User     Comment: chews tobacco  . Alcohol Use: No  . Drug Use: No  . Sexual Activity: Not on file   Other Topics Concern  . Not on file   Social History Narrative   Lives in a group home   Sister [name] is his legal guardian.   Sister and her family live locally    Past Surgical History  Procedure Laterality Date  . Prostate surgery      x2, ?procedure     BP 102/64  Pulse 74  Temp(Src) 97.8 F (36.6 C) (Oral)  Resp 18  Ht 5' 8.5" (1.74 m)  Wt 191 lb (86.637 kg)  BMI 28.62 kg/m2  SpO2 97%       Objective:   Physical Exam  Constitutional: Edward [name] is oriented to person, place, and time. Edward [name] appears well-nourished.  HENT:  Head: Normocephalic.  Cardiovascular: Normal rate and regular rhythm.   Pulmonary/Chest: Effort normal and breath sounds normal. Edward [name] has no wheezes.  Lungs clear throughout.  Neurological: Edward [name] is alert and oriented to person, place, and time.  Skin: Skin is warm and dry.  Psychiatric: Edward [name] has a normal mood and affect.          Assessment & Plan:  I have personally seen and examined patient and agree with Alma Friendly NP-student's assessment and plan.

## 2013-09-17 NOTE — Patient Instructions (Signed)
Obtain lab work prior to leaving today. Follow up in six months for fasting physical.

## 2013-09-18 ENCOUNTER — Telehealth: Payer: Self-pay | Admitting: Family

## 2013-09-18 LAB — TSH: TSH: 2.624 u[IU]/mL (ref 0.350–4.500)

## 2013-09-18 NOTE — Telephone Encounter (Signed)
Relevant patient education assigned to patient using Emmi. ° °

## 2013-10-16 DIAGNOSIS — N401 Enlarged prostate with lower urinary tract symptoms: Secondary | ICD-10-CM | POA: Diagnosis not present

## 2013-10-16 DIAGNOSIS — N139 Obstructive and reflux uropathy, unspecified: Secondary | ICD-10-CM | POA: Diagnosis not present

## 2013-10-19 ENCOUNTER — Telehealth: Payer: Self-pay | Admitting: Family

## 2013-10-19 NOTE — Telephone Encounter (Signed)
Spoke with pt's sister.  She states pt sees [facility],  Dr Alan Mulder @ [phone]. Her assistant's name is Harriett Ext [phone].  She also says she keeps getting a bill for pt's Medicare Wellness exam from 03/23/13 and doesn't understand why insurance didn't pay and wants to know if we should rebill the visit. She states medicare has never not paid for this before.  Please advise.

## 2013-10-19 NOTE — Telephone Encounter (Signed)
Insurance letter faxed to [phone]. Left detailed message on sister's voicemail and to call if any question.

## 2013-10-19 NOTE — Telephone Encounter (Signed)
I will forward to [place] to look into the medicare wellness coverage please.  Could you please forward copy of insurance letter re: drug interaction to Dr. Kathee Delton? Thanks.

## 2013-10-19 NOTE — Telephone Encounter (Signed)
Please contact pt's sister and ask her if Alcide is currently following with psychiatry.  If not, I would like to arrange consult.  [employer] is concerned about potential drug interaction between his fluoxetine and thioridazine and I would like to defer med changes to psychiatry.

## 2013-10-23 DIAGNOSIS — F70 Mild intellectual disabilities: Secondary | ICD-10-CM | POA: Diagnosis not present

## 2013-10-23 DIAGNOSIS — F209 Schizophrenia, unspecified: Secondary | ICD-10-CM | POA: Diagnosis not present

## 2013-11-23 ENCOUNTER — Ambulatory Visit (INDEPENDENT_AMBULATORY_CARE_PROVIDER_SITE_OTHER): Payer: Medicare Other | Admitting: Family

## 2013-11-23 ENCOUNTER — Encounter: Payer: Self-pay | Admitting: Family

## 2013-11-23 VITALS — BP 100/50 | HR 91 | Temp 97.4°F | Resp 16 | Ht 68.5 in | Wt 191.1 lb

## 2013-11-23 DIAGNOSIS — H1130 Conjunctival hemorrhage, unspecified eye: Secondary | ICD-10-CM | POA: Diagnosis not present

## 2013-11-23 DIAGNOSIS — H1131 Conjunctival hemorrhage, right eye: Secondary | ICD-10-CM

## 2013-11-23 NOTE — Progress Notes (Signed)
   Subjective:    Patient ID: Edward [name], male    DOB: 1946-06-23, 68 y.o.   MRN: [id number]  HPI  Edward [name] is a 68 yr old male who presents today with chief complaint of eye problem. He is brought today by his care giver. Denies associated pruritis or pain.  Denies visual changes.     Review of Systems See HPI  Past Medical History  Diagnosis Date  . Diabetes mellitus   . Urinary incontinence   . Mental retardation   . Schizophrenia   . BPH (benign prostatic hyperplasia)   . Thyroid disease   . Hypertension          Objective:   Physical Exam  Constitutional: He appears well-developed and well-nourished. No distress.  HENT:  Head: Normocephalic and atraumatic.  Eyes:            Assessment & Plan:

## 2013-11-23 NOTE — Assessment & Plan Note (Signed)
Assured caregiver that this is not contagious.  Should resolve on own over next few weeks.  They should let us know if pt develops any discharge from the eye.

## 2013-11-23 NOTE — Progress Notes (Signed)
Pre visit review using our clinic review tool, if applicable. No additional management support is needed unless otherwise documented below in the visit note. 

## 2013-11-23 NOTE — Patient Instructions (Signed)
Subconjunctival Hemorrhage °A subconjunctival hemorrhage is a bright red patch covering a portion of the white of the eye. The white part of the eye is called the sclera, and it is covered by a thin membrane called the conjunctiva. This membrane is clear, except for tiny blood vessels that you can see with the naked eye. When your eye is irritated or inflamed and becomes red, it is because the vessels in the conjunctiva are swollen. °Sometimes, a blood vessel in the conjunctiva can break and bleed. When this occurs, the blood builds up between the conjunctiva and the sclera, and spreads out to create a red area. The red spot may be very small at first. It may then spread to cover a larger part of the surface of the eye, or even all of the visible white part of the eye. °In almost all cases, the blood will go away and the eye will become white again. Before completely dissolving, however, the red area may spread. It may also become brownish-yellow in color, before going away. If a lot of blood collects under the conjunctiva, it may look like a bulge on the surface of the eye. This looks scary, but it will also eventually flatten out and go away. Subconjunctival hemorrhages do not cause pain, but if swollen, may cause a feeling of irritation. There is no effect on vision.  °CAUSES  °· The most common cause is mild trauma (rubbing the eye, irritation). °· Subconjunctival hemorrhages can happen because of coughing or straining (lifting heavy objects), vomiting, or sneezing. °· In some cases, your doctor may want to check your blood pressure. High blood pressure can also cause a sunconjunctival hemorrhage. °· Severe trauma or blunt injuries. °· Diseases that affect blood clotting (hemophilia, leukemia). °· Abnormalities of blood vessels behind the eye (carotid cavernous sinus fistula). °· Tumors behind the eye. °· Certain drugs (aspirin, coumadin, heparin). °· Recent eye surgery. °HOME CARE INSTRUCTIONS  °· Do not worry  about the appearance of your eye. You may continue your usual activities. °· Often, follow-up is not necessary. °SEEK MEDICAL CARE IF:  °· Your eye becomes painful. °· The bleeding does not disappear within 3 weeks. °· Bleeding occurs elsewhere, for example, under the skin, in the mouth, or in the other eye. °· You have recurring subconjunctival hemorrhages. °SEEK IMMEDIATE MEDICAL CARE IF:  °· Your vision changes or you have difficulty seeing. °· You develop severe headache, persistent vomiting, confusion, or abnormal drowsiness (lethargy). °· Your eye seems to bulge or protrude from the eye socket. °· You notice the sudden appearance of bruises, or have spontaneous bleeding elsewhere on your body. °Document Released: 07/12/2005 Document Revised: 10/04/2011 Document Reviewed: 06/09/2009 °[employer]® Patient Information ©2014 [employer]. ° °

## 2013-11-27 DIAGNOSIS — R338 Other retention of urine: Secondary | ICD-10-CM | POA: Diagnosis not present

## 2013-11-27 DIAGNOSIS — K8020 Calculus of gallbladder without cholecystitis without obstruction: Secondary | ICD-10-CM | POA: Diagnosis not present

## 2013-11-27 DIAGNOSIS — N281 Cyst of kidney, acquired: Secondary | ICD-10-CM | POA: Diagnosis not present

## 2013-11-28 ENCOUNTER — Telehealth: Payer: Self-pay | Admitting: Family

## 2013-11-28 MED ORDER — DOXYCYCLINE HYCLATE 100 MG PO TABS: 100.0000 mg | ORAL_TABLET | Freq: Two times a day (BID) | ORAL | Status: DC

## 2013-11-28 NOTE — Telephone Encounter (Signed)
Received CT report from Dr. Sammie Bench office.  CT notes stable left kidney cyst, and new RML airspace disease suspicious for pneumonia. I would like pt to start doxycycline, follow up in our office next Tues or Wednesday.  Should be seen sooner if shortness of breath, weakness occurs.

## 2013-11-28 NOTE — Telephone Encounter (Signed)
Notified Charlotte and scheduled f/u for 12/05/13 at 9:30am.

## 2013-11-28 NOTE — Telephone Encounter (Signed)
Left message on caretaker and sister's voicemails to return my call.

## 2013-11-29 ENCOUNTER — Emergency Department (HOSPITAL_BASED_OUTPATIENT_CLINIC_OR_DEPARTMENT_OTHER)
Admission: EM | Admit: 2013-11-29 | Discharge: 2013-11-29 | Disposition: A | Discharge status: Home / Self Care | Payer: Medicare Other | Attending: Emergency Medicine | Admitting: Emergency Medicine

## 2013-11-29 ENCOUNTER — Ambulatory Visit: Payer: Self-pay | Admitting: Physician Assistant

## 2013-11-29 ENCOUNTER — Encounter (HOSPITAL_BASED_OUTPATIENT_CLINIC_OR_DEPARTMENT_OTHER): Payer: Self-pay | Admitting: Emergency Medicine

## 2013-11-29 DIAGNOSIS — IMO0002 Reserved for concepts with insufficient information to code with codable children: Secondary | ICD-10-CM | POA: Insufficient documentation

## 2013-11-29 DIAGNOSIS — I10 Essential (primary) hypertension: Secondary | ICD-10-CM | POA: Insufficient documentation

## 2013-11-29 DIAGNOSIS — E119 Type 2 diabetes mellitus without complications: Secondary | ICD-10-CM | POA: Insufficient documentation

## 2013-11-29 DIAGNOSIS — E079 Disorder of thyroid, unspecified: Secondary | ICD-10-CM | POA: Diagnosis not present

## 2013-11-29 DIAGNOSIS — Z79899 Other long term (current) drug therapy: Secondary | ICD-10-CM | POA: Diagnosis not present

## 2013-11-29 DIAGNOSIS — N400 Enlarged prostate without lower urinary tract symptoms: Secondary | ICD-10-CM | POA: Diagnosis not present

## 2013-11-29 DIAGNOSIS — Z7982 Long term (current) use of aspirin: Secondary | ICD-10-CM | POA: Insufficient documentation

## 2013-11-29 DIAGNOSIS — F209 Schizophrenia, unspecified: Secondary | ICD-10-CM | POA: Diagnosis not present

## 2013-11-29 DIAGNOSIS — N390 Urinary tract infection, site not specified: Secondary | ICD-10-CM

## 2013-11-29 DIAGNOSIS — F79 Unspecified intellectual disabilities: Secondary | ICD-10-CM | POA: Diagnosis not present

## 2013-11-29 LAB — URINALYSIS, ROUTINE W REFLEX MICROSCOPIC
Bilirubin Urine: NEGATIVE
Glucose, UA: NEGATIVE mg/dL
KETONES UR: NEGATIVE mg/dL
NITRITE: NEGATIVE
PH: 8.5 — AB (ref 5.0–8.0)
SPECIFIC GRAVITY, URINE: 1.027 (ref 1.005–1.030)
Urobilinogen, UA: 1 mg/dL (ref 0.0–1.0)

## 2013-11-29 LAB — URINE MICROSCOPIC-ADD ON

## 2013-11-29 LAB — OCCULT BLOOD X 1 CARD TO LAB, STOOL: Fecal Occult Bld: NEGATIVE

## 2013-11-29 NOTE — ED Notes (Signed)
Urinary incontinence x 1- pad changed

## 2013-11-29 NOTE — ED Notes (Signed)
Per group home staff, bright red blood was in his depends this am and pt tells me he has testicular discomfort.

## 2013-11-29 NOTE — ED Provider Notes (Signed)
CSN: [id number]     Arrival date & time 11/29/13  7893 History   First MD Initiated Contact with Patient 11/29/13 [phone]     Chief Complaint  Patient presents with  . Penis Pain      Patient is a 68 y.o. male presenting with penile pain. The history is provided by the patient and a caregiver. The history is limited by the condition of the patient (history limited as patient has MR).  Penis Pain This is a new problem. Episode onset: unknown. The problem occurs constantly. Nothing aggravates the symptoms. Nothing relieves the symptoms.  pt presents from group home Caregiver reports pt informed him this morning he had penile/testicle pain While patient was in the shower, it was noted he had blood in his diaper Pt reports pain in his penis No other complaints - no abd pain, no fever/vomiting No trauma/falls reported No abuse has been reported  Per caregiver, pt seen two days ago in urologist office.  Caregiver reports pt requires catheter while at urology office but does not have chronic indwelling foley  He does not take anticoagulants  Past Medical History  Diagnosis Date  . Diabetes mellitus   . Urinary incontinence   . Mental retardation   . Schizophrenia   . BPH (benign prostatic hyperplasia)   . Thyroid disease   . Hypertension    Past Surgical History  Procedure Laterality Date  . Prostate surgery      x2, ?procedure   Family History  Problem Relation Age of Onset  . Arthritis Mother   . Arrhythmia Mother   . Lymphoma Mother     non-hodgkins  . Heart disease Father   . Stroke Father    History  Substance Use Topics  . Smoking status: Never Smoker   . Smokeless tobacco: Current User     Comment: chews tobacco  . Alcohol Use: No    Review of Systems  Unable to perform ROS: Other  Constitutional: Negative for fever.  Genitourinary: Positive for penile pain.  pt has MR which limits full Review of systems    Allergies  Review of patient's allergies  indicates no known allergies.  Home Medications   Prior to Admission medications   Medication Sig Start Date End Date Taking? Authorizing Provider  Acetaminophen (ACETAMINOPHEN EXTRA STRENGTH) 167 MG/5ML LIQD Take 15 mLs by mouth.    Historical Provider, MD  albuterol (PROVENTIL HFA;VENTOLIN HFA) 108 (90 BASE) MCG/ACT inhaler 2 puffs BID for 5 days, then q6h PRN cough. 09/06/13   Irene Pap, NP  aspirin 81 MG tablet Take 81 mg by mouth daily.    Historical Provider, MD  clotrimazole (LOTRIMIN) 1 % cream Apply 1 application topically daily as needed.    Historical Provider, MD  desonide (DESOWEN) 0.05 % lotion Apply 1 application topically 3 (three) times a week.    Historical Provider, MD  doxycycline (VIBRA-TABS) 100 MG tablet Take 1 tablet (100 mg total) by mouth 2 (two) times daily. 11/28/13   Debbrah Alar, NP  dutasteride (AVODART) 0.5 MG capsule Take 0.5 mg by mouth daily. Do Not Crush    Historical Provider, MD  Emollient (CERAVE) CREA Apply 1 application topically. For dry skin on feet and legs    Historical Provider, MD  FLUoxetine (PROZAC) 20 MG capsule Take 20 mg by mouth daily.    Historical Provider, MD  hydrocortisone valerate cream (WESTCORT) 0.2 % Apply 1 application topically 2 (two) times daily.    Historical Provider, MD  hydroxypropyl methylcellulose (ISOPTO TEARS) 2.5 % ophthalmic solution Place 1 drop into both eyes as needed for dry eyes.    Historical Provider, MD  ketoconazole (NIZORAL) 2 % cream Apply 1 application topically 2 (two) times daily. For face    Historical Provider, MD  levothyroxine (SYNTHROID, LEVOTHROID) 50 MCG tablet TAKE 1 TABLET BY MOUTH EVERY MORNING COURTESY FILL 07/20/13   Debbrah Alar, NP  lip balm (BLISTEX) OINT Apply 1 application topically 4 (four) times daily as needed for lip care.    Historical Provider, MD  Loperamide HCl (IMODIUM A-D PO) Take by mouth as needed.    Historical Provider, MD  PHARBEDRYL 25 MG capsule TAKE ONE  CAPSULE BY MOUTH AT BEDTIME 03/10/13   Debbrah Alar, NP  QUEtiapine (SEROQUEL) 50 MG tablet Take 50 mg by mouth 2 (two) times daily.    Historical Provider, MD  Simethicone (MYLANTA GAS PO) Take by mouth as needed.    Historical Provider, MD  TAMSULOSIN HCL PO Take 0.4 mg by mouth every morning.     Historical Provider, MD  thioridazine (MELLARIL) 50 MG tablet Take 1 tablet (50 mg total) by mouth every morning. 07/21/12   Norma Fredrickson, MD  thioridazine (MELLARIL) 50 MG tablet Take 200 mg by mouth at bedtime.    Historical Provider, MD  topiramate (TOPAMAX) 25 MG tablet Take 1 tablet (25 mg total) by mouth 2 (two) times daily. 07/21/12   Norma Fredrickson, MD   BP 130/68  Pulse 90  Temp(Src) 98.5 F (36.9 C) (Oral)  Resp 18  SpO2 100% Physical Exam CONSTITUTIONAL: Well developed/well nourished HEAD: Normocephalic/atraumatic EYES: EOMI/PERRL ENMT: Mucous membranes moist NECK: supple no meningeal signs SPINE:entire spine nontender CV: S1/S2 noted, no murmurs/rubs/gallops noted LUNGS: Lungs are clear to auscultation bilaterally, no apparent distress ABDOMEN: soft, nontender, no rebound or guarding Rectal - stool color brown, no melena or blood noted, chaperone present for exam GU:no cva tenderness, no bruising noted to flank. He is uncircumcised.  There is no blood noted at meatus.  No bruising or signs of trauma.  The foreskin is easily retractible.  The testicles are descended bilaterally and no bruising or tenderness noted.  Chaperone present for exam NEURO: Pt is awake/alert, moves all extremitiesx4 EXTREMITIES: pulses normal, full ROM SKIN: warm, color normal PSYCH: no abnormalities of mood noted  ED Course  Procedures Labs Review Labs Reviewed  URINALYSIS, ROUTINE W REFLEX MICROSCOPIC - Abnormal; Notable for the following:    APPearance TURBID (*)    pH 8.5 (*)    Hgb urine dipstick MODERATE (*)    Protein, ur >300 (*)    Leukocytes, UA LARGE (*)    All other components  within normal limits  URINE MICROSCOPIC-ADD ON - Abnormal; Notable for the following:    Bacteria, UA MANY (*)    Crystals TRIPLE PHOSPHATE CRYSTALS (*)    All other components within normal limits  URINE CULTURE  OCCULT BLOOD X 1 CARD TO LAB, STOOL  pt well appearing, he will give [place] urine sample He is in no distress, well appearing, defer further workup for now  10:09 AM Pt resting comfortably, no distress no pain complaints. He was able to urinate without difficulty Hemturia/uti noted He is already on doxycycline for pneumonia that was found incidentally on recent CT imaging Will send urine culture for now.  Will not add antibiotics unless culture indicates he needs other therapy   MDM   Final diagnoses:  UTI (lower urinary tract infection)  Nursing notes including past medical history and social history reviewed and considered in documentation Labs/vital reviewed and considered     Sharyon Cable, MD 11/29/13 1011

## 2013-11-30 NOTE — Telephone Encounter (Signed)
Notified pt's sister and she voices understanding. She would like [place] to do a CXR at his follow up.

## 2013-12-01 LAB — URINE CULTURE: Colony Count: >=100000

## 2013-12-02 ENCOUNTER — Telehealth (HOSPITAL_BASED_OUTPATIENT_CLINIC_OR_DEPARTMENT_OTHER): Payer: Self-pay | Admitting: Emergency Medicine

## 2013-12-02 NOTE — Telephone Encounter (Signed)
Post ED Visit - Positive Culture Follow-up: Successful Patient Follow-Up  Culture assessed and recommendations reviewed by: []  Wes Tecopa, Pharm.D., BCPS [x]  Heide Guile, Pharm.D., BCPS []  Alycia Rossetti, Pharm.D., BCPS []  Grady, Pharm.D., BCPS, AAHIVP []  Legrand Como, Pharm.D., BCPS, AAHIVP  Positive urine culture  [x]  Patient discharged without antimicrobial prescription and treatment is now indicated []  Organism is resistant to prescribed ED discharge antimicrobial []  Patient with positive blood cultures  Changes discussed with ED provider: Antonietta Breach PA-C New antibiotic prescription: Amoxicillin 250 mg PO TID x 10 days    [facility] 12/02/2013, 9:55 AM

## 2013-12-02 NOTE — Progress Notes (Signed)
ED Antimicrobial Stewardship Positive Culture Follow Up   Edward [name] is an 68 y.o. male who presented to [facility] on 11/29/2013 with a chief complaint of  Chief Complaint  Patient presents with  . Penis Pain    Recent Results (from the past 720 hour(s))  URINE CULTURE     Status: None   Collection Time    11/29/13  8:55 AM      Result Value Ref Range Status   Specimen Description URINE, CLEAN CATCH   Final   Special Requests NONE   Final   Culture  Setup Time     Final   Value: 11/29/2013 14:10     Performed at [facility]     Final   Value: >=100,000 COLONIES/ML     Performed at [facility]   Culture     Final   Value: PROTEUS MIRABILIS     Performed at [facility]   Report Status 12/01/2013 FINAL   Final   Organism ID, Bacteria PROTEUS MIRABILIS   Final     [x]  Patient discharged originally without antimicrobial agent and treatment is now indicated  New antibiotic prescription: amoxicillin 250mg  po TID x 10 days  ED Provider: Antonietta Breach, PA-C   [name] 12/02/2013, 9:25 AM Infectious Diseases Pharmacist Phone# [phone]

## 2013-12-05 ENCOUNTER — Ambulatory Visit (INDEPENDENT_AMBULATORY_CARE_PROVIDER_SITE_OTHER): Payer: Medicare Other | Admitting: Family

## 2013-12-05 ENCOUNTER — Encounter: Payer: Self-pay | Admitting: Family

## 2013-12-05 ENCOUNTER — Telehealth: Payer: Self-pay | Admitting: Family

## 2013-12-05 ENCOUNTER — Ambulatory Visit (HOSPITAL_BASED_OUTPATIENT_CLINIC_OR_DEPARTMENT_OTHER)
Admission: RE | Admit: 2013-12-05 | Discharge: 2013-12-05 | Disposition: A | Discharge status: Home / Self Care | Payer: Medicare Other | Source: Ambulatory Visit | Attending: Family | Admitting: Family

## 2013-12-05 VITALS — BP 128/60 | HR 92 | Temp 98.2°F | Resp 20 | Ht 68.5 in | Wt 187.0 lb

## 2013-12-05 DIAGNOSIS — J9819 Other pulmonary collapse: Secondary | ICD-10-CM | POA: Insufficient documentation

## 2013-12-05 DIAGNOSIS — J189 Pneumonia, unspecified organism: Secondary | ICD-10-CM

## 2013-12-05 NOTE — Progress Notes (Signed)
   Subjective:    Patient ID: Edward [name], male    DOB: February 01, 1946, 68 y.o.   MRN: [id number]  HPI  Edward [name] is a 68 yr old male who presents today for follow up of his pneumonia.  We received CT report from Dr. Janice Norrie, his urologist which made incidental note of new RML airspace disease suspicious for pneumonia. Pt was started on doxycline. He is brought here today by his sister and his care giver. They report that the patient has been afebrile, no coughing, good appetite. Review of Systems See HPI  Pmhx, pshx reviewed.      Objective:   Physical Exam  Constitutional: He is oriented to person, place, and time. He appears well-developed and well-nourished. No distress.  Cardiovascular: Normal rate and regular rhythm.   No murmur heard. Pulmonary/Chest: Effort normal and breath sounds normal. No respiratory distress. He has no wheezes. He has no rales. He exhibits no tenderness.  Musculoskeletal: He exhibits no edema.  Neurological: He is alert and oriented to person, place, and time.  Psychiatric: He has a normal mood and affect. His behavior is normal. Thought content normal.          Assessment & Plan:

## 2013-12-05 NOTE — Assessment & Plan Note (Signed)
Clinically stable. Follow up CXR for resolution.

## 2013-12-05 NOTE — Patient Instructions (Signed)
Please complete chest x ray on the first floor.  

## 2013-12-05 NOTE — Telephone Encounter (Signed)
Notified caregiver, [name] and pt's sister, Golda Acre. CXR order placed.

## 2013-12-05 NOTE — Telephone Encounter (Signed)
CXR notes almost complete resolution of pneumonia.  I would like him to repeat cxr in 3 weeks to ensure complete clearing-dx pneumonia.

## 2013-12-06 ENCOUNTER — Telehealth (HOSPITAL_BASED_OUTPATIENT_CLINIC_OR_DEPARTMENT_OTHER): Payer: Self-pay | Admitting: Emergency Medicine

## 2013-12-07 ENCOUNTER — Telehealth: Payer: Self-pay | Admitting: *Deleted

## 2013-12-07 MED ORDER — CEPHALEXIN 500 MG PO CAPS: 500.0000 mg | ORAL_CAPSULE | Freq: Two times a day (BID) | ORAL | Status: DC

## 2013-12-07 NOTE — Telephone Encounter (Addendum)
It may cover the bacteria that grew but it may not. I would recommend 5 days of keflex to be sure that infection is fully treated.

## 2013-12-07 NOTE — Telephone Encounter (Signed)
Notified [name] and she voices understanding.

## 2013-12-07 NOTE — Telephone Encounter (Signed)
Received message from [facility] in Flow Management with [facility] requesting return call re: pt's urine culture from ER visit on 11/29/13. Attempted to reach [facility] but she is out of the office today. Spoke with pt's caregiver, [name] and she states that pt was contacted after we saw him on 12/05/13 by urology and told that the doxycycline we prescribed for his pneumonia should cover the UTI.  Please see urine culture from 11/29/13 and advise.

## 2013-12-19 ENCOUNTER — Telehealth: Payer: Self-pay | Admitting: *Deleted

## 2013-12-19 NOTE — Telephone Encounter (Signed)
Received message from [place] at [place] stating pt is taking benadryl 25mg  for sleep and it doesn't seem to be helping at this time. They are requesting rx for 50mg  at bedtime. States they contacted psychiatry and was told to check with [place] first.  Please advise.

## 2013-12-20 NOTE — Telephone Encounter (Signed)
Lisa from [facility] called back regarding this. She states that since patient is in a home, the rx will actually need to be faxed to [facility]. Please call Lattie Haw at [phone] when this has been sent. Also, Lattie Haw would like to know if we could also fax the rx to her as well. Fax# [phone]

## 2013-12-20 NOTE — Telephone Encounter (Signed)
OK, however if pt becomes very sedated in the AM on this dose we may need to cut back.

## 2013-12-21 MED ORDER — DIPHENHYDRAMINE HCL 25 MG PO CAPS: 50.0000 mg | ORAL_CAPSULE | Freq: Every day | ORAL | Status: DC

## 2013-12-21 MED ORDER — DIPHENHYDRAMINE HCL 25 MG PO CAPS: 50.0000 mg | ORAL_CAPSULE | Freq: Every evening | ORAL | Status: DC | PRN

## 2013-12-21 NOTE — Addendum Note (Signed)
Addended by: Kelle Darting A on: 12/21/2013 11:22 AM   Modules accepted: Orders

## 2013-12-21 NOTE — Telephone Encounter (Signed)
Rx sent to [facility] with corrected directions.

## 2013-12-21 NOTE — Telephone Encounter (Signed)
Left detailed message on Edward [name]'s voicemail and to let me know if Rx does not come through. Rx also faxed to # below.

## 2013-12-21 NOTE — Telephone Encounter (Signed)
Edward [name] returned phone call. She states that the updated directions also need to be sent to [facility]. She says that the directions need to match for [employer] and [employer].

## 2013-12-21 NOTE — Telephone Encounter (Signed)
Received message from [place] at [place] requesting that we remove as needed from Benadryl directions as they will be giving it to patient every night. Directions update and faxed to [facility] @ [phone].

## 2013-12-21 NOTE — Addendum Note (Signed)
Addended by: Kelle Darting A on: 12/21/2013 10:33 AM   Modules accepted: Orders

## 2013-12-26 ENCOUNTER — Ambulatory Visit (HOSPITAL_BASED_OUTPATIENT_CLINIC_OR_DEPARTMENT_OTHER)
Admission: RE | Admit: 2013-12-26 | Discharge: 2013-12-26 | Disposition: A | Discharge status: Home / Self Care | Payer: Medicare Other | Source: Ambulatory Visit | Attending: Family | Admitting: Family

## 2013-12-26 DIAGNOSIS — J189 Pneumonia, unspecified organism: Secondary | ICD-10-CM | POA: Diagnosis present

## 2013-12-26 DIAGNOSIS — J9819 Other pulmonary collapse: Secondary | ICD-10-CM | POA: Insufficient documentation

## 2013-12-27 ENCOUNTER — Telehealth: Payer: Self-pay | Admitting: Family

## 2013-12-27 DIAGNOSIS — J189 Pneumonia, unspecified organism: Secondary | ICD-10-CM

## 2013-12-27 NOTE — Telephone Encounter (Signed)
CXR shows near resolutions of changes from pneumonia.  I would like him to repeat x ray in 1 month for complete resolution.

## 2013-12-28 NOTE — Telephone Encounter (Signed)
Left detailed message on caretakers # that order has been placed for 1 month cxr and to call if any questions.

## 2014-01-08 DIAGNOSIS — R338 Other retention of urine: Secondary | ICD-10-CM | POA: Diagnosis not present

## 2014-01-08 DIAGNOSIS — N139 Obstructive and reflux uropathy, unspecified: Secondary | ICD-10-CM | POA: Diagnosis not present

## 2014-01-22 DIAGNOSIS — F259 Schizoaffective disorder, unspecified: Secondary | ICD-10-CM | POA: Diagnosis not present

## 2014-01-22 DIAGNOSIS — F70 Mild intellectual disabilities: Secondary | ICD-10-CM | POA: Diagnosis not present

## 2014-02-06 ENCOUNTER — Ambulatory Visit (HOSPITAL_BASED_OUTPATIENT_CLINIC_OR_DEPARTMENT_OTHER)
Admission: RE | Admit: 2014-02-06 | Discharge: 2014-02-06 | Disposition: A | Discharge status: Home / Self Care | Payer: Medicare Other | Source: Ambulatory Visit | Attending: Family | Admitting: Family

## 2014-02-06 ENCOUNTER — Telehealth: Payer: Self-pay

## 2014-02-06 DIAGNOSIS — E785 Hyperlipidemia, unspecified: Secondary | ICD-10-CM

## 2014-02-06 DIAGNOSIS — E119 Type 2 diabetes mellitus without complications: Secondary | ICD-10-CM

## 2014-02-06 DIAGNOSIS — J189 Pneumonia, unspecified organism: Secondary | ICD-10-CM | POA: Diagnosis not present

## 2014-02-06 DIAGNOSIS — R918 Other nonspecific abnormal finding of lung field: Secondary | ICD-10-CM | POA: Diagnosis not present

## 2014-02-06 NOTE — Telephone Encounter (Signed)
Diabetic Bundle:  I spoke with patients sister (sybil) and she is going to call patients home care and have them call us back.  Pt needs an A1C and LDL.  Labs ordered

## 2014-02-13 DIAGNOSIS — N139 Obstructive and reflux uropathy, unspecified: Secondary | ICD-10-CM | POA: Diagnosis not present

## 2014-03-06 ENCOUNTER — Encounter: Payer: Self-pay | Admitting: Family

## 2014-03-06 ENCOUNTER — Ambulatory Visit (INDEPENDENT_AMBULATORY_CARE_PROVIDER_SITE_OTHER): Payer: Medicare Other | Admitting: Family

## 2014-03-06 VITALS — BP 110/62 | HR 88 | Temp 97.5°F | Resp 18 | Ht 68.5 in | Wt 179.0 lb

## 2014-03-06 DIAGNOSIS — N489 Disorder of penis, unspecified: Secondary | ICD-10-CM | POA: Diagnosis not present

## 2014-03-06 DIAGNOSIS — N4889 Other specified disorders of penis: Secondary | ICD-10-CM | POA: Insufficient documentation

## 2014-03-06 NOTE — Assessment & Plan Note (Signed)
?   UTI. Pt is unable to provide urine sample for UA/Culture. His care giver will assist him in the home and return sample to [place] for evaluation.

## 2014-03-06 NOTE — Patient Instructions (Signed)
Please bring [place] urine sample at your earliest convenience.

## 2014-03-06 NOTE — Progress Notes (Signed)
Pre visit review using our clinic review tool, if applicable. No additional management support is needed unless otherwise documented below in the visit note. 

## 2014-03-06 NOTE — Progress Notes (Signed)
   Subjective:    Patient ID: Edward [name], male    DOB: 1946-03-12, 68 y.o.   MRN: [id number]  HPI  Edward [name] is a 68 yr old mentally disabled male who presents today with his caregiver from his group home. The caregiver tells me that the pt described a pulling sensation in the penis this AM around 7:30 AM  The patient apparently has had similar symptoms in the past with previous UTI.    The patient denies dysuria.  Denies current penile pain.     Review of Systems    see HPI   BP 110/62  Pulse 88  Temp(Src) 97.5 F (36.4 C) (Oral)  Resp 18  Ht 5' 8.5" (1.74 m)  Wt 179 lb 0.6 oz (81.212 kg)  BMI 26.82 kg/m2  SpO2 99%    Objective:   Physical Exam  Constitutional: He appears well-developed and well-nourished. No distress.  HENT:  Head: Normocephalic and atraumatic.  Cardiovascular: Normal rate and regular rhythm.   No murmur heard. Pulmonary/Chest: Effort normal and breath sounds normal. No respiratory distress. He has no wheezes. He has no rales. He exhibits no tenderness.  Genitourinary:  Declines GU examination  Neurological: He is alert.          Assessment & Plan:

## 2014-03-27 DIAGNOSIS — R338 Other retention of urine: Secondary | ICD-10-CM | POA: Diagnosis not present

## 2014-04-02 ENCOUNTER — Encounter: Payer: Self-pay | Admitting: Family

## 2014-04-02 ENCOUNTER — Ambulatory Visit (INDEPENDENT_AMBULATORY_CARE_PROVIDER_SITE_OTHER): Payer: Medicare Other | Admitting: Family

## 2014-04-02 VITALS — BP 100/70 | HR 80 | Temp 98.1°F | Resp 16 | Ht 68.5 in | Wt 174.4 lb

## 2014-04-02 DIAGNOSIS — E119 Type 2 diabetes mellitus without complications: Secondary | ICD-10-CM

## 2014-04-02 DIAGNOSIS — N289 Disorder of kidney and ureter, unspecified: Secondary | ICD-10-CM

## 2014-04-02 DIAGNOSIS — W19XXXA Unspecified fall, initial encounter: Secondary | ICD-10-CM

## 2014-04-02 DIAGNOSIS — N2889 Other specified disorders of kidney and ureter: Secondary | ICD-10-CM

## 2014-04-02 DIAGNOSIS — Z9181 History of falling: Secondary | ICD-10-CM

## 2014-04-02 DIAGNOSIS — E039 Hypothyroidism, unspecified: Secondary | ICD-10-CM | POA: Diagnosis not present

## 2014-04-02 DIAGNOSIS — F209 Schizophrenia, unspecified: Secondary | ICD-10-CM

## 2014-04-02 DIAGNOSIS — Z23 Encounter for immunization: Secondary | ICD-10-CM

## 2014-04-02 DIAGNOSIS — R634 Abnormal weight loss: Secondary | ICD-10-CM

## 2014-04-02 DIAGNOSIS — Z0000 Encounter for general adult medical examination without abnormal findings: Secondary | ICD-10-CM | POA: Diagnosis not present

## 2014-04-02 DIAGNOSIS — R296 Repeated falls: Secondary | ICD-10-CM

## 2014-04-02 LAB — TSH: TSH: 2.9 u[IU]/mL (ref 0.35–4.50)

## 2014-04-02 LAB — BASIC METABOLIC PANEL
BUN: 20 mg/dL (ref 6–23)
CALCIUM: 8.6 mg/dL (ref 8.4–10.5)
CO2: 27 mEq/L (ref 19–32)
Chloride: 108 mEq/L (ref 96–112)
Creatinine, Ser: 1.2 mg/dL (ref 0.4–1.5)
GFR: 66.71 mL/min (ref >60.00)
Glucose, Bld: 103 mg/dL — ABNORMAL HIGH (ref 70–99)
Potassium: 3.8 mEq/L (ref 3.5–5.1)
Sodium: 141 mEq/L (ref 135–145)

## 2014-04-02 LAB — HEMOGLOBIN A1C: HEMOGLOBIN A1C: 5.5 % (ref 4.6–6.5)

## 2014-04-02 NOTE — Assessment & Plan Note (Signed)
Lab Results  Component Value Date   TSH 2.90 04/02/2014   TSH stable on current dose of synthroid, continue same.

## 2014-04-02 NOTE — Assessment & Plan Note (Signed)
Will request home health PT and rolling walker for safety/gait training.

## 2014-04-02 NOTE — Assessment & Plan Note (Addendum)
Lab Results  Component Value Date   HGBA1C 5.5 04/02/2014   A1C is stable off meds as his weight is approaching normal. Sister is requesting referral to podiatry for toenail care.

## 2014-04-02 NOTE — Patient Instructions (Signed)
You will be contacted about your referrals. Please complete lab work prior to leaving.  Follow up in 3 moths.

## 2014-04-02 NOTE — Assessment & Plan Note (Signed)
I am concerned that he continues to lose weight.  I was hopeful that his weight would level off and that it was due to increased activity and change in diet with his current residence.  He has had a CT abd/pelvis and CXR which did not show any obvious malignancy.  TSH normal, will refer to GI for further evaluation.

## 2014-04-02 NOTE — Assessment & Plan Note (Signed)
Was felt to be cyst per Dr. Janice Norrie- no further work up needed.  Office not reviewed.

## 2014-04-02 NOTE — Progress Notes (Signed)
Pre visit review using our clinic review tool, if applicable. No additional management support is needed unless otherwise documented below in the visit note. 

## 2014-04-02 NOTE — Assessment & Plan Note (Signed)
Currently stable, but did have episode of auditory hallucination per sister, advised her to make sure pt keeps psych apt.

## 2014-04-02 NOTE — Progress Notes (Signed)
Subjective:    Patient ID: Edward [name], male    DOB: [birth year], 68 y.o.   MRN: [id number] . HPI  Subjective:   Patient here for Medicare annual wellness visit and management of other chronic and acute problems.    DM2- sugars are well controlled since he has lost weight. He is due for eye exam.   Lab Results  Component Value Date   HGBA1C 5.4 03/23/2013   HGBA1C 5.5 08/24/2012   HGBA1C 5.4 05/19/2012   Lab Results  Component Value Date   LDLCALC 76 08/24/2012   CREATININE 1.05 06/07/2013   Wt Readings from Last 3 Encounters:  04/02/14 174 lb 6.4 oz (79.107 kg)  03/06/14 179 lb 0.6 oz (81.212 kg)  12/05/13 187 lb (84.823 kg)   Depression- stable. He continues to follow with psychiatry. Next apt is 04/24/14.  Sister notes speech is more mumbling. Would like to talk to his psychiatrist.  Notes that a few weeks ago he noted hearing "voices."  Overall, she reports that his mood is very cheerful.   HTN- notes increased falls. BP Readings from Last 3 Encounters:  04/02/14 100/70  03/06/14 110/62  12/05/13 128/60     Body mass index is 26.13 kg/(m^2).  Risk factors: DM2  Roster of Physicians Providing Medical Care to Patient: Dr. Kristine Royal- psych Dr. Janice Norrie ([facility])   Activities of Daily Living  In your present state of health, do you have any difficulty performing the following activities? Preparing food and eating?: Yes Bathing yourself: yes Getting dressed: yes Using the toilet:yes Moving around from place to place: No  In the past year have you fallen or had a near fall?:yes Home Safety: Has smoke detector and wears seat belts. No firearms. No excess sun exposure.  Diet and Exercise  Current exercise habits: walks some at the park Dietary issues discussed: healthy diet   Depression Screen  (Note: if answer to either of the following is "Yes", then a more complete depression screening is indicated)  Q1: Over the past two weeks, have you felt down,  depressed or hopeless?no  Q2: Over the past two weeks, have you felt little interest or pleasure in doing things? no   The following portions of the patient's history were reviewed and updated as appropriate: allergies, current medications, past family history, past medical history, past social history, past surgical history and problem list.    Objective:   Vision: unable due to mental deficit Hearing: grossly intact Body mass index:  Body mass index is 26.13 kg/(m^2). Cognitive Impairment Assessment: cognition, memory and judgment appear normal.   Assessment:   Medicare wellness utd on preventive parameters  Plan:   During the course of the visit the patient was educated and counseled about appropriate screening and preventive services including:  Fall prevention   Bone densitometry screening  Colonoscopy- sister (POA) declines colo for pt.  Vaccines / LABS  Prevnar and pneumovax today.  Patient Instructions (the written plan) was given to the patient.       Review of Systems See HPI  Past Medical History  Diagnosis Date  . Diabetes mellitus   . Urinary incontinence   . Mental retardation   . Schizophrenia   . BPH (benign prostatic hyperplasia)   . Thyroid disease   . Hypertension     History   Social History  . Marital Status: Single    Spouse Name: N/A    Number of Children: N/A  . Years of Education: N/A  Occupational History  . Not on file.   Social History Main Topics  . Smoking status: Never Smoker   . Smokeless tobacco: Current User     Comment: chews tobacco  . Alcohol Use: No  . Drug Use: No  . Sexual Activity: Not on file   Other Topics Concern  . Not on file   Social History Narrative   Lives in a group home   Sister [name] is his legal guardian.   Sister and her family live locally    Past Surgical History  Procedure Laterality Date  . Prostate surgery      x2, ?procedure          Objective:   Physical Exam    Constitutional: He is oriented to person, place, and time. He appears well-developed and well-nourished. No distress.  HENT:  Head: Normocephalic and atraumatic.  Cardiovascular: Normal rate and regular rhythm.   No murmur heard. Pulmonary/Chest: Effort normal and breath sounds normal. No respiratory distress. He has no wheezes. He has no rales. He exhibits no tenderness.  Neurological: He is alert and oriented to person, place, and time.  Psychiatric: He has a normal mood and affect. His behavior is normal. Judgment and thought content normal.          Assessment & Plan:

## 2014-04-04 DIAGNOSIS — R262 Difficulty in walking, not elsewhere classified: Secondary | ICD-10-CM | POA: Diagnosis not present

## 2014-04-04 DIAGNOSIS — F209 Schizophrenia, unspecified: Secondary | ICD-10-CM | POA: Diagnosis not present

## 2014-04-04 DIAGNOSIS — M6281 Muscle weakness (generalized): Secondary | ICD-10-CM | POA: Diagnosis not present

## 2014-04-04 DIAGNOSIS — G2401 Drug induced subacute dyskinesia: Secondary | ICD-10-CM | POA: Diagnosis not present

## 2014-04-04 DIAGNOSIS — E119 Type 2 diabetes mellitus without complications: Secondary | ICD-10-CM | POA: Diagnosis not present

## 2014-04-05 ENCOUNTER — Encounter: Payer: Self-pay | Admitting: Physician Assistant

## 2014-04-05 ENCOUNTER — Telehealth: Payer: Self-pay | Admitting: *Deleted

## 2014-04-05 NOTE — Telephone Encounter (Signed)
Left message for pt's sister to return my call re: completed FL2 form. Unsure if form is to go to pt's sister or to the group home where pt resides.

## 2014-04-05 NOTE — Telephone Encounter (Signed)
Patient sister returned phone call. She states to send form to where patient resides attn: [employer]

## 2014-04-08 NOTE — Telephone Encounter (Signed)
Form mailed to [facility]. [address], [place], [place] [address]. Copy sent for scanning.

## 2014-04-09 ENCOUNTER — Ambulatory Visit (INDEPENDENT_AMBULATORY_CARE_PROVIDER_SITE_OTHER): Payer: Medicare Other

## 2014-04-09 VITALS — BP 131/69 | HR 71 | Resp 14 | Ht 70.0 in | Wt 175.0 lb

## 2014-04-09 DIAGNOSIS — E119 Type 2 diabetes mellitus without complications: Secondary | ICD-10-CM

## 2014-04-09 DIAGNOSIS — M79676 Pain in unspecified toe(s): Secondary | ICD-10-CM

## 2014-04-09 DIAGNOSIS — B351 Tinea unguium: Secondary | ICD-10-CM | POA: Diagnosis not present

## 2014-04-09 DIAGNOSIS — M79609 Pain in unspecified limb: Secondary | ICD-10-CM | POA: Diagnosis not present

## 2014-04-09 NOTE — Progress Notes (Signed)
   Subjective:    Patient ID: Edward [name], male    DOB: 1945-10-13, 68 y.o.   MRN: [id number]  HPI Comments: Pt presents for debridement of 10 elongated toenails.     Review of Systems  All other systems reviewed and are negative.      Objective:   Physical Exam 50-year-old white male well-developed well-nourished although not oriented at this time presents with a family member caregiver for debridement of nails. Unable to cut nails himself or by his family is in a group home at this time patient does have mental health issues history of schizophrenia. As such as thick brittle dystrophic nails requiring palliative nail care.  Lower extremity objective findings vascular status to be intact with pedal pulses palpable DP +2/4 PT plus one over 4 mild +1 edema bilateral mild varicosities noted neurologically epicritic and proprioceptive sensations intact and symmetric bilateral there is normal plantar response DTRs not listed patient decreased sensation to toes patient does have some reactions to touch and palpation. Neurologically epicritic sensation diminished orthopedic biomechanical exam patient does have significant arthritic changes of the foot with has valgus deformity prominence of talonavicular joint medially there is rigid digital contractures 2 through 5 with some thickening dystrophy and proptosis of hallux nails bilateral most thick painful tender symptomatic on palpation and attempted debridement. Remaining nails also shows some thickening yellowing although not as severe. No open wounds ulcerations is a history of previous he treated for diabetes with oral medications, however is been off his medications for at least 2 years, only diet controlled         Assessment & Plan:  Assessment this time is possible history of diabetes without complications being noted at this time thick brittle dystrophic probably nails hallux second third and fourth digits bilateral painful and  symptomatic both on palpation with enclosed shoe wear are debrided nails painful mycotic nails debrided 1 through 54 bilateral return for future palliative care and as-needed basis  [name]

## 2014-04-09 NOTE — Patient Instructions (Signed)

## 2014-04-10 ENCOUNTER — Telehealth: Payer: Self-pay | Admitting: *Deleted

## 2014-04-10 DIAGNOSIS — G2401 Drug induced subacute dyskinesia: Secondary | ICD-10-CM | POA: Diagnosis not present

## 2014-04-10 DIAGNOSIS — M6281 Muscle weakness (generalized): Secondary | ICD-10-CM | POA: Diagnosis not present

## 2014-04-10 DIAGNOSIS — F209 Schizophrenia, unspecified: Secondary | ICD-10-CM | POA: Diagnosis not present

## 2014-04-10 DIAGNOSIS — E119 Type 2 diabetes mellitus without complications: Secondary | ICD-10-CM | POA: Diagnosis not present

## 2014-04-10 DIAGNOSIS — R262 Difficulty in walking, not elsewhere classified: Secondary | ICD-10-CM | POA: Diagnosis not present

## 2014-04-10 NOTE — Telephone Encounter (Signed)
Received fax from [facility] requesting documentation of face to face encounter for PT referral.  Form initiated and forwarded to Provider for completion/signature.

## 2014-04-12 DIAGNOSIS — G2401 Drug induced subacute dyskinesia: Secondary | ICD-10-CM | POA: Diagnosis not present

## 2014-04-12 DIAGNOSIS — R262 Difficulty in walking, not elsewhere classified: Secondary | ICD-10-CM | POA: Diagnosis not present

## 2014-04-12 DIAGNOSIS — M6281 Muscle weakness (generalized): Secondary | ICD-10-CM | POA: Diagnosis not present

## 2014-04-12 DIAGNOSIS — E119 Type 2 diabetes mellitus without complications: Secondary | ICD-10-CM | POA: Diagnosis not present

## 2014-04-12 DIAGNOSIS — F209 Schizophrenia, unspecified: Secondary | ICD-10-CM | POA: Diagnosis not present

## 2014-04-15 DIAGNOSIS — R262 Difficulty in walking, not elsewhere classified: Secondary | ICD-10-CM | POA: Diagnosis not present

## 2014-04-15 DIAGNOSIS — G2401 Drug induced subacute dyskinesia: Secondary | ICD-10-CM | POA: Diagnosis not present

## 2014-04-15 DIAGNOSIS — F209 Schizophrenia, unspecified: Secondary | ICD-10-CM | POA: Diagnosis not present

## 2014-04-15 DIAGNOSIS — M6281 Muscle weakness (generalized): Secondary | ICD-10-CM | POA: Diagnosis not present

## 2014-04-15 DIAGNOSIS — E119 Type 2 diabetes mellitus without complications: Secondary | ICD-10-CM | POA: Diagnosis not present

## 2014-04-15 NOTE — Telephone Encounter (Signed)
Paperwork signed by provider and faxed back to [facility]

## 2014-04-17 DIAGNOSIS — R262 Difficulty in walking, not elsewhere classified: Secondary | ICD-10-CM | POA: Diagnosis not present

## 2014-04-17 DIAGNOSIS — E119 Type 2 diabetes mellitus without complications: Secondary | ICD-10-CM | POA: Diagnosis not present

## 2014-04-17 DIAGNOSIS — M6281 Muscle weakness (generalized): Secondary | ICD-10-CM | POA: Diagnosis not present

## 2014-04-17 DIAGNOSIS — G2401 Drug induced subacute dyskinesia: Secondary | ICD-10-CM | POA: Diagnosis not present

## 2014-04-17 DIAGNOSIS — F209 Schizophrenia, unspecified: Secondary | ICD-10-CM | POA: Diagnosis not present

## 2014-04-23 ENCOUNTER — Ambulatory Visit (INDEPENDENT_AMBULATORY_CARE_PROVIDER_SITE_OTHER): Payer: Medicare Other | Admitting: Family

## 2014-04-23 ENCOUNTER — Ambulatory Visit (INDEPENDENT_AMBULATORY_CARE_PROVIDER_SITE_OTHER): Payer: Medicare Other | Admitting: Physician Assistant

## 2014-04-23 ENCOUNTER — Encounter: Payer: Self-pay | Admitting: Physician Assistant

## 2014-04-23 ENCOUNTER — Encounter: Payer: Self-pay | Admitting: Family

## 2014-04-23 VITALS — BP 118/62 | HR 70 | Ht 68.0 in | Wt 177.0 lb

## 2014-04-23 VITALS — BP 113/56 | HR 84 | Temp 98.2°F | Resp 16 | Ht 68.5 in | Wt 177.2 lb

## 2014-04-23 DIAGNOSIS — F5089 Other specified eating disorder: Secondary | ICD-10-CM | POA: Insufficient documentation

## 2014-04-23 DIAGNOSIS — R634 Abnormal weight loss: Secondary | ICD-10-CM | POA: Diagnosis not present

## 2014-04-23 LAB — CBC WITH DIFFERENTIAL/PLATELET
BASOS ABS: 0 10*3/uL (ref 0.0–0.1)
BASOS PCT: 0.5 % (ref 0.0–3.0)
Eosinophils Absolute: 0.9 10*3/uL — ABNORMAL HIGH (ref 0.0–0.7)
Eosinophils Relative: 12.4 % — ABNORMAL HIGH (ref 0.0–5.0)
HEMATOCRIT: 36.4 % — AB (ref 39.0–52.0)
HEMOGLOBIN: 12.3 g/dL — AB (ref 13.0–17.0)
LYMPHS ABS: 1.4 10*3/uL (ref 0.7–4.0)
LYMPHS PCT: 18.8 % (ref 12.0–46.0)
MCHC: 33.9 g/dL (ref 30.0–36.0)
MCV: 97 fl (ref 78.0–100.0)
MONOS PCT: 7.8 % (ref 3.0–12.0)
Monocytes Absolute: 0.6 10*3/uL (ref 0.1–1.0)
NEUTROS ABS: 4.5 10*3/uL (ref 1.4–7.7)
Neutrophils Relative %: 60.5 % (ref 43.0–77.0)
Platelets: 323 10*3/uL (ref 150.0–400.0)
RBC: 3.76 Mil/uL — AB (ref 4.22–5.81)
RDW: 13.3 % (ref 11.5–15.5)
WBC: 7.4 10*3/uL (ref 4.0–10.5)

## 2014-04-23 NOTE — Progress Notes (Signed)
I agree with the above plan, note 

## 2014-04-23 NOTE — Progress Notes (Signed)
Pre visit review using our clinic review tool, if applicable. No additional management support is needed unless otherwise documented below in the visit note. 

## 2014-04-23 NOTE — Patient Instructions (Signed)
Please complete lab work prior to leaving. Follow up in March as scheduled. Sooner if problems/concerns.

## 2014-04-23 NOTE — Assessment & Plan Note (Signed)
Weight has stabilized.  I have advised the sister to continue to monitor.

## 2014-04-23 NOTE — Assessment & Plan Note (Signed)
We did discuss with pt and sister importance of discontinuation of chewing tobacco.  CBC does reveal a mild anemia, will obtain further studies to evaluate anemia.

## 2014-04-23 NOTE — Patient Instructions (Signed)
Please talk to the psychiatrist abour weaning off Topamax and any other medications associated with weight loss. Call back if you want to have the CT abdomen pelvis.

## 2014-04-23 NOTE — Progress Notes (Signed)
   Subjective:    Patient ID: Edward [name], male    DOB: 04-20-1946, 68 y.o.   MRN: [id number]  HPI  Mr.  Edward [name] is a 68 yr old male who presents today with his sister and his caregiver from his home. They have two concerns:  Wt Readings from Last 3 Encounters:  04/23/14 177 lb 3.2 oz (80.377 kg)  04/23/14 177 lb (80.287 kg)  04/09/14 175 lb (79.379 kg)   Sister is concerned that he may have some wax build up as he is not hearing well. They do not wish to get him hearing aids but would like wax removed.   Sister reports that the patient chews tobacco but she has been lax in bringing it to him and he ran out.  He apparently has been chewing grass and leaves instead.  The sister wonders if he may have a vitamin deficiency.   Review of Systems See HPI    Past Medical History  Diagnosis Date  . Diabetes mellitus   . Urinary incontinence   . Mental retardation   . Schizophrenia   . BPH (benign prostatic hyperplasia)   . Thyroid disease   . Hypertension     History   Social History  . Marital Status: Single    Spouse Name: N/A    Number of Children: N/A  . Years of Education: N/A   Occupational History  . Not on file.   Social History Main Topics  . Smoking status: Never Smoker   . Smokeless tobacco: Current User     Comment: chews tobacco  . Alcohol Use: No  . Drug Use: No  . Sexual Activity: Not on file   Other Topics Concern  . Not on file   Social History Narrative   Lives in a group home   Sister Edward [name] is his legal guardian.   Sister and her family live locally    Past Surgical History  Procedure Laterality Date  . Prostate surgery      x2, ?procedure    Family History  Problem Relation Age of Onset  . Arthritis Mother   . Arrhythmia Mother   . Lymphoma Mother     non-hodgkins  . Heart disease Father   . Stroke Father     No Known Allergies    BP 113/56  Pulse 84  Temp(Src) 98.2 F (36.8 C) (Oral)  Resp 16  Ht 5' 8.5" (1.74 m)   Wt 177 lb 3.2 oz (80.377 kg)  BMI 26.55 kg/m2  SpO2 100%    Objective:   Physical Exam  Constitutional: He appears well-developed and well-nourished. No distress.  HENT:  Head: Normocephalic and atraumatic.  No significant wax build up noted in either ear.  Cardiovascular: Normal rate and regular rhythm.   No murmur heard. Pulmonary/Chest: Effort normal and breath sounds normal. No respiratory distress. He has no wheezes. He has no rales. He exhibits no tenderness.  Musculoskeletal: He exhibits no edema.  Neurological: He is alert.          Assessment & Plan:

## 2014-04-23 NOTE — Progress Notes (Signed)
Subjective:    Patient ID: Edward [name], male    DOB: 1945-11-04, 68 y.o.   MRN: [id number]  HPI Edward [name]" is a pleasant 68 year old white male, and new to GI, referred by Melissa O. Conley Canal NP for evaluation of weight loss. Patient has apparently lost about 50 pounds over the past 2 years. His weight is down about 15 pounds over the past year by our records. Much of his history is given by his sister. Patient has history of mental retardation and schizophrenia and lives in a group home. Deatra Ina of the staff members from the home also accompanied today. Patient does have history of adult-onset diabetes mellitus and hypothyroidism as well as hypertension and BPH. Apparently his diet has changed over the past 2 years since living at the group, and he is eating a carbon modified diet. He has also been able to calm off of his medications for diabetes. He apparently has a very good appetite, eats well and never has any complaints of abdominal discomfort or nausea per the group home staff. His bowel movements have been regular there is no melena or hematochezia. He has not been on a new medications but has been on multiple psychotropic agents over the past many years. Recent labs had been done and were unremarkable. He had a CT scan of his abdomen and pelvis without IV or oral contrast done by urology in May of 2015 and this was negative. Recent chest x-ray in September 2015 was negative. Family history is negative for colon cancer polyps. Patient's sister relates that she does not feel that he is a candidate for colonoscopy as he would not cooperate with the prep. She is not anxious to proceed with more x-rays if possible because she has to accompany him and stay in the room for the x-rays are being taken in order for him to cooperate and is concerned about radiation exposure.     Review of Systems  Constitutional: Positive for unexpected weight change.  HENT: Negative.   Eyes: Negative.     Respiratory: Negative.   Cardiovascular: Negative.   Gastrointestinal: Negative.   Endocrine: Negative.   Genitourinary: Negative.   Musculoskeletal: Negative.   Skin: Negative.   Allergic/Immunologic: Negative.   Neurological: Negative.   Hematological: Negative.   Psychiatric/Behavioral: Negative.    Outpatient Prescriptions Prior to Visit  Medication Sig Dispense Refill  . Acetaminophen (ACETAMINOPHEN EXTRA STRENGTH) 167 MG/5ML LIQD Take 15 mLs by mouth.      Marland Kitchen albuterol (PROVENTIL HFA;VENTOLIN HFA) 108 (90 BASE) MCG/ACT inhaler 2 puffs BID for 5 days, then q6h PRN cough.  1 Inhaler  0  . aspirin 81 MG tablet Take 81 mg by mouth daily.      . clotrimazole (LOTRIMIN) 1 % cream Apply 1 application topically daily as needed.      . desonide (DESOWEN) 0.05 % lotion Apply 1 application topically 3 (three) times a week.      . diphenhydrAMINE (BENADRYL) 25 mg capsule Take 2 capsules (50 mg total) by mouth at bedtime.  60 capsule  3  . dutasteride (AVODART) 0.5 MG capsule Take 0.5 mg by mouth daily. Do Not Crush      . Emollient (CERAVE) CREA Apply 1 application topically. For dry skin on feet and legs      . FLUoxetine (PROZAC) 20 MG capsule Take 20 mg by mouth daily.      Marland Kitchen ketoconazole (NIZORAL) 2 % cream Apply 1 application topically 2 (two) times  daily. For face      . levothyroxine (SYNTHROID, LEVOTHROID) 50 MCG tablet TAKE 1 TABLET BY MOUTH EVERY MORNING COURTESY FILL  30 tablet  0  . lip balm (BLISTEX) OINT Apply 1 application topically 4 (four) times daily as needed for lip care.      . Loperamide HCl (IMODIUM A-D PO) Take by mouth as needed.      Marland Kitchen QUEtiapine (SEROQUEL) 50 MG tablet Take 50 mg by mouth 2 (two) times daily.      . Simethicone (MYLANTA GAS PO) Take by mouth as needed.      Marland Kitchen TAMSULOSIN HCL PO Take 0.4 mg by mouth every morning.       . thioridazine (MELLARIL) 50 MG tablet Take 1 tablet (50 mg total) by mouth every morning.  30 tablet  10  . thioridazine  (MELLARIL) 50 MG tablet Take 200 mg by mouth at bedtime.      . topiramate (TOPAMAX) 25 MG tablet Take 1 tablet (25 mg total) by mouth 2 (two) times daily.  60 tablet  10   No facility-administered medications prior to visit.   No Known Allergies Patient Active Problem List   Diagnosis Date Noted  . Recurrent falls 04/02/2014  . Routine general medical examination at a health care facility 03/23/2013  . Unspecified hypothyroidism 01/05/2013  . Kidney mass 11/24/2012  . Insomnia 10/13/2012  . Loss of weight 10/13/2012  . Eczema 04/10/2012  . Schizophrenia 04/10/2012  . BPH (benign prostatic hyperplasia) 04/10/2012  . HTN (hypertension) 04/10/2012  . Type II or unspecified type diabetes mellitus without mention of complication, not stated as uncontrolled 04/10/2012  . Hyperlipidemia 04/10/2012   History  Substance Use Topics  . Smoking status: Never Smoker   . Smokeless tobacco: Current User     Comment: chews tobacco  . Alcohol Use: No   family history includes Arrhythmia in his mother; Arthritis in his mother; Heart disease in his father; Lymphoma in his mother; Stroke in his father.     Objective:   Physical Exam  well-developed older white male in no acute distress. Blood pressure 118/62, pulse 70 height 5 foot 8 weight 177. HEENT; nontraumatic normocephalic EOMI PERRLA sclera anicteric, Supple; no JVD, Cardiovascular; regular rate and rhythm with S1-S2 no murmur or gallop, Pulmonary; clear bilaterally, Abdomen; soft nontender nondistended bowel sounds are active there is no palpable mass or hepatosplenomegaly, Rectal; exam not done, Extremities ;no clubbing cyanosis or edema skin warm and dry, Psych; patient is mentally retarded, he is cooperative and answered questions in simple sentences      Assessment & Plan:  #30 68 year old white male with unexplained weight loss of 50 pounds over 2 years, weight loss closer to 15 pounds over the past one year. Etiology may be  secondary to changes in diet no would have expected him to level off at some point. He is completely asymptomatic. Rule out medication-induced i.e. Topamax Rule out occult malignancy-less likely as he has no other "red flags" #2 mental retardation #3 history of schizophrenia #4.onset diabetes mellitus #5 hypertension #6 hypothyroidism  Plan; Discussed CT scan of the abdomen and pelvis with IV and oral contrast to rule out occult intra-abdominal malignancy. Patient's sister is hesitant at present as he has a difficult time cooperating and exams involve her being in the room where she is being exposed to radiation.  Discussed colon neoplasia surveillance with:Cologuard however this is not indicated if patient is unable to have a colonoscopy if test returns  positive and therefore will not pursue Patient's sister states they have an appointment to see his psychiatrist later this week and advised a discussed in detail weaning him off of Topamax and discussing any other medications which may be contributing to weight loss which may be able to be dosed reduced etc. They will call back in a few weeks if decision is made to pursue the CT scan.

## 2014-04-24 DIAGNOSIS — F209 Schizophrenia, unspecified: Secondary | ICD-10-CM | POA: Diagnosis not present

## 2014-04-24 DIAGNOSIS — R262 Difficulty in walking, not elsewhere classified: Secondary | ICD-10-CM | POA: Diagnosis not present

## 2014-04-24 DIAGNOSIS — E119 Type 2 diabetes mellitus without complications: Secondary | ICD-10-CM | POA: Diagnosis not present

## 2014-04-24 DIAGNOSIS — F70 Mild intellectual disabilities: Secondary | ICD-10-CM | POA: Diagnosis not present

## 2014-04-24 DIAGNOSIS — G2401 Drug induced subacute dyskinesia: Secondary | ICD-10-CM | POA: Diagnosis not present

## 2014-04-24 DIAGNOSIS — M6281 Muscle weakness (generalized): Secondary | ICD-10-CM | POA: Diagnosis not present

## 2014-04-26 DIAGNOSIS — E119 Type 2 diabetes mellitus without complications: Secondary | ICD-10-CM | POA: Diagnosis not present

## 2014-04-26 DIAGNOSIS — F209 Schizophrenia, unspecified: Secondary | ICD-10-CM | POA: Diagnosis not present

## 2014-04-26 DIAGNOSIS — R262 Difficulty in walking, not elsewhere classified: Secondary | ICD-10-CM | POA: Diagnosis not present

## 2014-04-26 DIAGNOSIS — M6281 Muscle weakness (generalized): Secondary | ICD-10-CM | POA: Diagnosis not present

## 2014-04-26 DIAGNOSIS — G2401 Drug induced subacute dyskinesia: Secondary | ICD-10-CM | POA: Diagnosis not present

## 2014-04-29 DIAGNOSIS — G2401 Drug induced subacute dyskinesia: Secondary | ICD-10-CM | POA: Diagnosis not present

## 2014-04-29 DIAGNOSIS — M6281 Muscle weakness (generalized): Secondary | ICD-10-CM | POA: Diagnosis not present

## 2014-04-29 DIAGNOSIS — R262 Difficulty in walking, not elsewhere classified: Secondary | ICD-10-CM | POA: Diagnosis not present

## 2014-04-29 DIAGNOSIS — E119 Type 2 diabetes mellitus without complications: Secondary | ICD-10-CM | POA: Diagnosis not present

## 2014-04-29 DIAGNOSIS — F209 Schizophrenia, unspecified: Secondary | ICD-10-CM | POA: Diagnosis not present

## 2014-05-01 ENCOUNTER — Telehealth: Payer: Self-pay | Admitting: *Deleted

## 2014-05-01 DIAGNOSIS — F5089 Other specified eating disorder: Secondary | ICD-10-CM

## 2014-05-01 DIAGNOSIS — D6489 Other specified anemias: Secondary | ICD-10-CM

## 2014-05-01 DIAGNOSIS — R262 Difficulty in walking, not elsewhere classified: Secondary | ICD-10-CM | POA: Diagnosis not present

## 2014-05-01 DIAGNOSIS — M6281 Muscle weakness (generalized): Secondary | ICD-10-CM | POA: Diagnosis not present

## 2014-05-01 DIAGNOSIS — F209 Schizophrenia, unspecified: Secondary | ICD-10-CM | POA: Diagnosis not present

## 2014-05-01 DIAGNOSIS — E119 Type 2 diabetes mellitus without complications: Secondary | ICD-10-CM | POA: Diagnosis not present

## 2014-05-01 DIAGNOSIS — D539 Nutritional anemia, unspecified: Secondary | ICD-10-CM

## 2014-05-01 DIAGNOSIS — G2401 Drug induced subacute dyskinesia: Secondary | ICD-10-CM | POA: Diagnosis not present

## 2014-05-01 NOTE — Telephone Encounter (Signed)
Notified pt's caregiver, Lattie Haw at [phone]. They will attempt to collect IFOB at the home and will let us know if they are unsuccessful. Lab appt has been scheduled for 05/02/14 at 10:30am and IFOB kit has been left in lab. If IFOB collection is unsuccessful at the home they will let us know and we will schedule appt in office for rectal exam/hemoccult. Notified pt's sister. She voices understanding and states she will be in this area next Wednesday if we need to arrange office appt. Lab orders entered.

## 2014-05-01 NOTE — Telephone Encounter (Signed)
Message copied by Ronny Flurry on Wed May 01, 2014  4:20 PM ------      Message from: O'SULLIVAN, MELISSA      Created: Thu Apr 25, 2014  8:49 AM       Lab work is showing anemia.  Please notify sister. Could you please ask lab to add on b12, folate, iron, dx anemia?  I would also like to have pt complete ifob kit.  If they do not feel that they can do this at the home, then we can bring him back to the office for hemocult. ------

## 2014-05-02 ENCOUNTER — Other Ambulatory Visit (INDEPENDENT_AMBULATORY_CARE_PROVIDER_SITE_OTHER): Payer: Medicare Other

## 2014-05-02 DIAGNOSIS — D539 Nutritional anemia, unspecified: Secondary | ICD-10-CM

## 2014-05-02 DIAGNOSIS — D6489 Other specified anemias: Secondary | ICD-10-CM

## 2014-05-02 LAB — VITAMIN B12: Vitamin B-12: 749 pg/mL (ref 211–911)

## 2014-05-02 LAB — FOLATE: Folate: 19.2 ng/mL (ref >5.9)

## 2014-05-02 LAB — IRON: IRON: 17 ug/dL — AB (ref 42–165)

## 2014-05-05 ENCOUNTER — Telehealth: Payer: Self-pay | Admitting: Family

## 2014-05-05 DIAGNOSIS — D509 Iron deficiency anemia, unspecified: Secondary | ICD-10-CM

## 2014-05-05 NOTE — Telephone Encounter (Signed)
Iron level is low.  He should start iron supplement 325mg  twice daily.  Repeat cbc, serum iron in 3 months. Also, I would like him to complete an IFOB please and I recommend that they follow through with the CT scan of the abdomen which was recommended by GI.

## 2014-05-06 ENCOUNTER — Telehealth: Payer: Self-pay | Admitting: Family

## 2014-05-06 ENCOUNTER — Encounter: Payer: Self-pay | Admitting: Family

## 2014-05-06 ENCOUNTER — Ambulatory Visit (INDEPENDENT_AMBULATORY_CARE_PROVIDER_SITE_OTHER): Payer: Medicare Other | Admitting: Family

## 2014-05-06 VITALS — BP 108/63 | HR 111 | Temp 98.5°F | Resp 16 | Ht 68.5 in

## 2014-05-06 DIAGNOSIS — L89152 Pressure ulcer of sacral region, stage 2: Secondary | ICD-10-CM | POA: Diagnosis not present

## 2014-05-06 DIAGNOSIS — D509 Iron deficiency anemia, unspecified: Secondary | ICD-10-CM | POA: Diagnosis not present

## 2014-05-06 DIAGNOSIS — L98429 Non-pressure chronic ulcer of back with unspecified severity: Secondary | ICD-10-CM | POA: Insufficient documentation

## 2014-05-06 MED ORDER — DUODERM CGF DRESSING EX MISC: 1.0000 | CUTANEOUS | Status: DC

## 2014-05-06 NOTE — Telephone Encounter (Signed)
I will request home health nurse to come provide dressing changes.

## 2014-05-06 NOTE — Assessment & Plan Note (Signed)
Apply duoderm every 3 days to sacral ulcer at top of the buttocks.  Try to have Edward [name] get up and move around throughout the day to keep pressure off of his sacral area.   Follow up in 3 weeks so we can check the wound. Call if increased redness, swelling, drainage or skin breakdown.

## 2014-05-06 NOTE — Assessment & Plan Note (Signed)
hemocult negative today in office, though only a scant amount of stool was obtained.  Start iron supplement, will encourage sister to have pt complete CT abd/pelvis per GI recommendations.

## 2014-05-06 NOTE — Telephone Encounter (Signed)
Caller name: Lisa[facility] Call back number: [phone] Pharmacy:  Reason for call:   Lattie Haw states that patient ins will not cover the patches that were prescribed today. She is wanting to know if there is an alternative to this.

## 2014-05-06 NOTE — Telephone Encounter (Signed)
Edward [name]-- pt's home was unable to collect stool for IFOB and they came to the office today for hemoccult. Do I need to give them another IFOB to try?

## 2014-05-06 NOTE — Patient Instructions (Addendum)
Start iron 325 mg twice daily for iron deficiency anemia. Apply duoderm every 3 days to sacral ulcer at top of the buttocks.  Try to have Edward [name] get up and move around throughout the day to keep pressure off of his sacral area.   Follow up in 3 weeks so we can check the wound. Call if increased redness, swelling, drainage or skin breakdown.

## 2014-05-06 NOTE — Telephone Encounter (Signed)
NO.  He does need to start iron and I recommend CT abdomen (see below). Would you please notify sister and let her know that I got a small sample of stool on exam but it was negative for blood.

## 2014-05-06 NOTE — Progress Notes (Signed)
Pre visit review using our clinic review tool, if applicable. No additional management support is needed unless otherwise documented below in the visit note. 

## 2014-05-06 NOTE — Telephone Encounter (Signed)
Left message for pts sister to return my call.

## 2014-05-06 NOTE — Progress Notes (Signed)
   Subjective:    Patient ID: Edward [name], male    DOB: February 22, 1946, 68 y.o.   MRN: [id number]  HPI  [name] is a 68 yr old male who presents today for hemoccult. He was noted to have iron deficiency anemia and they are unable to complete IFOB at the home. He did see GI recently with his sister and they recommended a CT abdomen and Pelvis.  She was hesitant to proceed with CT due to radiation exposure to the sister who must accompany him to all procedures.    Review of Systems    see HPI   BP 108/63  Pulse 111  Temp(Src) 98.5 F (36.9 C) (Oral)  Resp 16  Ht 5' 8.5" (1.74 m)  SpO2 97%    Objective:   Physical Exam  Constitutional: He appears well-developed and well-nourished. No distress.  Genitourinary: Rectum normal. Guaiac negative stool.  Neurological: He is alert.  Skin:  Stage 2 sacral pressure ulcer noted          Assessment & Plan:

## 2014-05-07 ENCOUNTER — Telehealth: Payer: Self-pay | Admitting: Family

## 2014-05-07 MED ORDER — FERROUS SULFATE 325 (65 FE) MG PO TABS: 325.0000 mg | ORAL_TABLET | Freq: Two times a day (BID) | ORAL | Status: DC

## 2014-05-07 NOTE — Telephone Encounter (Signed)
Caller name: [facility] Relation to pt: Call back number: [phone] Pharmacy: [facility]  Reason for call:   Lattie Haw states that [facility] did not receive iron supplements rx. Please resend  Fax [phone]

## 2014-05-07 NOTE — Telephone Encounter (Signed)
Noted  

## 2014-05-07 NOTE — Telephone Encounter (Signed)
°  Caller name: Oshields,[name] Relation to pt: legal guardian  Call back number: [phone]   Reason for call:   Pt had CT scan 11/27/13 [facility] and states it would be costly if another CT is done so soon

## 2014-05-07 NOTE — Telephone Encounter (Signed)
Rx re-sent via eRx.

## 2014-05-07 NOTE — Telephone Encounter (Signed)
Had CT 5/15, sister declines CT at this time.

## 2014-05-07 NOTE — Telephone Encounter (Signed)
Left message for Lisa to return my call

## 2014-05-07 NOTE — Telephone Encounter (Signed)
Notified Lisa at [facility]. She states they have already ordered pt's iron supplement and he should be starting on it today. Scheduled lab draw for 08/07/14 and orders have been entered. Do you need to enter CT order, I did not see it in EPIC?

## 2014-05-07 NOTE — Telephone Encounter (Signed)
Notified pt's sister and she is agreeable for pt to proceed with CT. Future lab orders entered. She is going to check with Dr Sammie Bench office and will let us know if they have a more recent CT than 11/27/12. Left message for Lattie Haw from [facility] to return my call.

## 2014-05-08 ENCOUNTER — Telehealth: Payer: Self-pay | Admitting: Family

## 2014-05-08 DIAGNOSIS — E119 Type 2 diabetes mellitus without complications: Secondary | ICD-10-CM | POA: Diagnosis not present

## 2014-05-08 DIAGNOSIS — M6281 Muscle weakness (generalized): Secondary | ICD-10-CM | POA: Diagnosis not present

## 2014-05-08 DIAGNOSIS — R339 Retention of urine, unspecified: Secondary | ICD-10-CM | POA: Diagnosis not present

## 2014-05-08 DIAGNOSIS — R262 Difficulty in walking, not elsewhere classified: Secondary | ICD-10-CM | POA: Diagnosis not present

## 2014-05-08 DIAGNOSIS — G2401 Drug induced subacute dyskinesia: Secondary | ICD-10-CM | POA: Diagnosis not present

## 2014-05-08 DIAGNOSIS — F209 Schizophrenia, unspecified: Secondary | ICD-10-CM | POA: Diagnosis not present

## 2014-05-08 NOTE — Telephone Encounter (Signed)
Caller name: [name] Relation to pt: sister Call back number:[phone]   Reason for call:  Wants to be sure to confirm CT results from [facility], that was done in May.  Call back.

## 2014-05-08 NOTE — Telephone Encounter (Signed)
Left detailed message on voicemail that we did receive CT from 11/2013 and will not proceed with additional CT due to family's concern with cost unless they tell us otherwise.

## 2014-05-08 NOTE — Telephone Encounter (Signed)
Below recommendation per 05/07/14 phone note.

## 2014-05-09 NOTE — Telephone Encounter (Signed)
Please call patient sister regarding CT. She states that if [facility] feels like CT from [facility] will not work, then proceed with CT and that his siblings will pay for this if his insurance does not cover. Best # [phone]

## 2014-05-13 NOTE — Telephone Encounter (Signed)
Please call pt's sister- the Ct scan done at [facility] was done with no contrast of any sort so cannot use that to rule out a malignancy. Pt was seen for weight loss- he was on several psych meds including topamax which can cause weight loss- family was to meeting with hi therapist to discuss meds and weaning off meds that might contribute to weight loss-... Have any changes been made to his meds?

## 2014-05-13 NOTE — Telephone Encounter (Signed)
I am not the provider who ordered CT.  I will forward this to GI.

## 2014-05-14 NOTE — Telephone Encounter (Signed)
Sister reports the patient is off of Topamax now. He has been off of it for almost a month and doing well. He will see his primary care provider on 05/27/14. She and the rest of the family are agreeable to further testing if recommended.

## 2014-05-15 NOTE — Telephone Encounter (Signed)
Is he is gaining weight and eating well we can hold off on CT- they need to document some weights over the next month, and call back with progress report

## 2014-05-15 NOTE — Telephone Encounter (Signed)
Left Ms. Edelson (sister) a detailed message and sent a message via "MyChart"

## 2014-05-22 ENCOUNTER — Encounter: Payer: Self-pay | Admitting: Family

## 2014-05-22 NOTE — Telephone Encounter (Signed)
Lets try to move his appointment up to tomorrow please.

## 2014-05-22 NOTE — Telephone Encounter (Signed)
Spoke with Supervisor, Lattie Haw. She states she will talk with the other manager that may have sent this message as she is not aware if pt is having symptoms. She states she just took pt to urologist on 04/08/14 and they have to catheterize pt. They talked to urologist about the strong urinary odor smell and he thinks it is where the urine is collecting and intensifying the odor. She will call back tomorrow to arrange appt if needed after she speaks with her other manager.

## 2014-05-23 ENCOUNTER — Ambulatory Visit (INDEPENDENT_AMBULATORY_CARE_PROVIDER_SITE_OTHER): Payer: Medicare Other | Admitting: Family

## 2014-05-23 ENCOUNTER — Encounter: Payer: Self-pay | Admitting: Family

## 2014-05-23 VITALS — BP 116/73 | HR 93 | Temp 97.7°F | Wt 173.0 lb

## 2014-05-23 DIAGNOSIS — R8290 Unspecified abnormal findings in urine: Secondary | ICD-10-CM

## 2014-05-23 DIAGNOSIS — N3001 Acute cystitis with hematuria: Secondary | ICD-10-CM | POA: Diagnosis not present

## 2014-05-23 DIAGNOSIS — N390 Urinary tract infection, site not specified: Secondary | ICD-10-CM | POA: Insufficient documentation

## 2014-05-23 DIAGNOSIS — R8299 Other abnormal findings in urine: Secondary | ICD-10-CM | POA: Diagnosis not present

## 2014-05-23 LAB — POCT URINALYSIS DIPSTICK
Bilirubin, UA: NEGATIVE
Glucose, UA: NEGATIVE
Ketones, UA: NEGATIVE
NITRITE UA: POSITIVE
Spec Grav, UA: 1.015
UROBILINOGEN UA: 0.2
pH, UA: 8.5

## 2014-05-23 MED ORDER — CIPROFLOXACIN HCL 250 MG PO TABS: 250.0000 mg | ORAL_TABLET | Freq: Two times a day (BID) | ORAL | Status: DC

## 2014-05-23 NOTE — Progress Notes (Signed)
   Subjective:    Patient ID: Edward [name], male    DOB: 02/26/1946, 68 y.o.   MRN: [id number]  HPI  [name] is a 68 yr old male who presents today due to foul smelling urine. This was noted by the staff at his group home.  Staff notes increased bed wetting and increased frequency.  Pt denies dysuria. Staff denies change in mental status of fevers.      Review of Systems See HPI  Past Medical History  Diagnosis Date  . Diabetes mellitus   . Urinary incontinence   . Mental retardation   . Schizophrenia   . BPH (benign prostatic hyperplasia)   . Thyroid disease   . Hypertension     History   Social History  . Marital Status: Single    Spouse Name: N/A    Number of Children: N/A  . Years of Education: N/A   Occupational History  . Not on file.   Social History Main Topics  . Smoking status: Never Smoker   . Smokeless tobacco: Current User     Comment: chews tobacco  . Alcohol Use: No  . Drug Use: No  . Sexual Activity: Not on file   Other Topics Concern  . Not on file   Social History Narrative   Lives in a group home   Sister [name] is his legal guardian.   Sister and her family live locally    Past Surgical History  Procedure Laterality Date  . Prostate surgery      x2, ?procedure    Family History  Problem Relation Age of Onset  . Arthritis Mother   . Arrhythmia Mother   . Lymphoma Mother     non-hodgkins  . Heart disease Father   . Stroke Father     No Known Allergies  BP 116/73  Pulse 93  Temp(Src) 97.7 F (36.5 C)  Wt 173 lb (78.472 kg)  SpO2 96%       Objective:   Physical Exam  Constitutional: He appears well-developed and well-nourished. No distress.  HENT:  Head: Normocephalic and atraumatic.  Cardiovascular: Normal rate and regular rhythm.   No murmur heard. Pulmonary/Chest: Effort normal and breath sounds normal. No respiratory distress. He has no wheezes. He has no rales. He exhibits no tenderness.  Abdominal:  Soft. Bowel sounds are normal. He exhibits no distension. There is no CVA tenderness.  Musculoskeletal: He exhibits no edema.  Lymphadenopathy:    He has no cervical adenopathy.  Neurological: He is alert.  Psychiatric: He has a normal mood and affect. His behavior is normal. Thought content normal.          Assessment & Plan:

## 2014-05-23 NOTE — Patient Instructions (Signed)
Please start cipro for urinary tract infection. Call if odor does not improve or if patient complains of burning.

## 2014-05-23 NOTE — Progress Notes (Signed)
Pre visit review using our clinic review tool, if applicable. No additional management support is needed unless otherwise documented below in the visit note. 

## 2014-05-23 NOTE — Assessment & Plan Note (Signed)
UA suggestive of UTI. Will send for culture and initiate cipro.   Staff member instructed to call if symptoms worsen or do not improve.

## 2014-05-24 DIAGNOSIS — R262 Difficulty in walking, not elsewhere classified: Secondary | ICD-10-CM | POA: Diagnosis not present

## 2014-05-24 DIAGNOSIS — G2401 Drug induced subacute dyskinesia: Secondary | ICD-10-CM | POA: Diagnosis not present

## 2014-05-24 DIAGNOSIS — M6281 Muscle weakness (generalized): Secondary | ICD-10-CM | POA: Diagnosis not present

## 2014-05-24 DIAGNOSIS — E119 Type 2 diabetes mellitus without complications: Secondary | ICD-10-CM | POA: Diagnosis not present

## 2014-05-24 DIAGNOSIS — F209 Schizophrenia, unspecified: Secondary | ICD-10-CM | POA: Diagnosis not present

## 2014-05-27 ENCOUNTER — Telehealth: Payer: Self-pay | Admitting: Family

## 2014-05-27 ENCOUNTER — Ambulatory Visit (INDEPENDENT_AMBULATORY_CARE_PROVIDER_SITE_OTHER): Payer: Medicare Other | Admitting: Family

## 2014-05-27 ENCOUNTER — Encounter: Payer: Self-pay | Admitting: Family

## 2014-05-27 VITALS — BP 120/62 | HR 91 | Temp 98.2°F | Resp 16 | Ht 68.5 in | Wt 174.2 lb

## 2014-05-27 DIAGNOSIS — N3001 Acute cystitis with hematuria: Secondary | ICD-10-CM | POA: Diagnosis not present

## 2014-05-27 DIAGNOSIS — L98429 Non-pressure chronic ulcer of back with unspecified severity: Secondary | ICD-10-CM

## 2014-05-27 DIAGNOSIS — L89152 Pressure ulcer of sacral region, stage 2: Secondary | ICD-10-CM

## 2014-05-27 LAB — URINE CULTURE: Colony Count: >=100000

## 2014-05-27 MED ORDER — AMOXICILLIN-POT CLAVULANATE 875-125 MG PO TABS: 1.0000 | ORAL_TABLET | Freq: Two times a day (BID) | ORAL | Status: DC

## 2014-05-27 NOTE — Assessment & Plan Note (Addendum)
Unchanged.  Family is willing to pay out of pocket for duoderm.  Will check on status of home health RN referral  I have personally seen and examined patient and agree with Jettie Booze NP student's assessment and plan.

## 2014-05-27 NOTE — Telephone Encounter (Signed)
Could you please refer to a different home health agency since [facility] is unable to accept patient?

## 2014-05-27 NOTE — Telephone Encounter (Signed)
Caller name:[facility]  Relation to pt: self  Call back number: [phone]   Reason for call:   pharmacy inquiring if the cipro is d/c because the pt is is now on amoxicillin-clavulanate (AUGMENTIN) 875-125 MG per tablet. Please advise

## 2014-05-27 NOTE — Progress Notes (Signed)
   Subjective:    Patient ID: Edward [name], male    DOB: 06/22/1946, 68 y.o.   MRN: [id number]  HPI Mr. Tuberville is a 68 year old male who presents today for follow up.   1. Stage 2 sacral ulcer - Patient was seen in the office three weeks ago and instructed to use duoderm and increase activity.  This has not been done, and home health has not been out to the facility he lives in.  Caregiver reports that Mr. Beauchaine has increased his activity the past three weeks.  He walks and does exercises.    2. UTI - Was treated for UTI last week, he is currently taking Cipro.   Caregiver reports urine odor has decreased bed wetting at night.     Review of Systems  Constitutional: Negative for fever, chills and fatigue.  HENT: Negative.   Respiratory: Negative for cough and shortness of breath.   Cardiovascular: Negative for chest pain, palpitations and leg swelling.  Genitourinary: Negative for dysuria, flank pain and discharge.  Musculoskeletal: Negative.   Skin: Negative for color change, pallor and rash.   Past Medical History  Diagnosis Date  . Diabetes mellitus   . Urinary incontinence   . Mental retardation   . Schizophrenia   . BPH (benign prostatic hyperplasia)   . Thyroid disease   . Hypertension     History   Social History  . Marital Status: Single    Spouse Name: N/A    Number of Children: N/A  . Years of Education: N/A   Occupational History  . Not on file.   Social History Main Topics  . Smoking status: Never Smoker   . Smokeless tobacco: Current User     Comment: chews tobacco  . Alcohol Use: No  . Drug Use: No  . Sexual Activity: Not on file   Other Topics Concern  . Not on file   Social History Narrative   Lives in a group home   Sister [name] is his legal guardian.   Sister and her family live locally    Past Surgical History  Procedure Laterality Date  . Prostate surgery      x2, ?procedure    Family History  Problem Relation Age of Onset    . Arthritis Mother   . Arrhythmia Mother   . Lymphoma Mother     non-hodgkins  . Heart disease Father   . Stroke Father     No Known Allergies  BP 120/62 mmHg  Pulse 91  Temp(Src) 98.2 F (36.8 C) (Oral)  Resp 16  Ht 5' 8.5" (1.74 m)  Wt 174 lb 3.2 oz (79.017 kg)  BMI 26.10 kg/m2  SpO2 100%       Objective:   Physical Exam  Constitutional: He appears well-developed and well-nourished.  HENT:  Head: Normocephalic and atraumatic.  Cardiovascular: Normal rate, regular rhythm and normal heart sounds.   Pulmonary/Chest: Effort normal and breath sounds normal.  Skin: Skin is warm and dry. No erythema.                Assessment & Plan:  Will change Rx for UTI to Augmentin as urine micro reveals UTI is resistant to Cipro.    After verbal consent was obtained from pt's sister POA, above photo was obtained.  More erythema is noted than is apparent on above low resolution photo.

## 2014-05-27 NOTE — Telephone Encounter (Signed)
Notified Heather at [facility] that Cipro was d/c today and pt is to begin Augmentin.

## 2014-05-27 NOTE — Assessment & Plan Note (Signed)
Stop Cipro change to Augmentin.

## 2014-05-27 NOTE — Patient Instructions (Addendum)
Stop cipro, start augmentin instead.  Apply duoderm every 3 days, try to remain off of buttocks as much as possible.  Follow up in 3 weeks.

## 2014-05-28 ENCOUNTER — Other Ambulatory Visit: Payer: Self-pay | Admitting: Family Medicine

## 2014-05-31 ENCOUNTER — Emergency Department (HOSPITAL_BASED_OUTPATIENT_CLINIC_OR_DEPARTMENT_OTHER): Payer: Medicare Other

## 2014-05-31 ENCOUNTER — Emergency Department (HOSPITAL_BASED_OUTPATIENT_CLINIC_OR_DEPARTMENT_OTHER)
Admission: EM | Admit: 2014-05-31 | Discharge: 2014-05-31 | Disposition: A | Discharge status: Home / Self Care | Payer: Medicare Other | Attending: Emergency Medicine | Admitting: Emergency Medicine

## 2014-05-31 ENCOUNTER — Encounter (HOSPITAL_BASED_OUTPATIENT_CLINIC_OR_DEPARTMENT_OTHER): Payer: Self-pay

## 2014-05-31 DIAGNOSIS — S01112A Laceration without foreign body of left eyelid and periocular area, initial encounter: Secondary | ICD-10-CM | POA: Diagnosis not present

## 2014-05-31 DIAGNOSIS — F209 Schizophrenia, unspecified: Secondary | ICD-10-CM | POA: Diagnosis not present

## 2014-05-31 DIAGNOSIS — Y9389 Activity, other specified: Secondary | ICD-10-CM | POA: Diagnosis not present

## 2014-05-31 DIAGNOSIS — Z87448 Personal history of other diseases of urinary system: Secondary | ICD-10-CM | POA: Diagnosis not present

## 2014-05-31 DIAGNOSIS — W1830XA Fall on same level, unspecified, initial encounter: Secondary | ICD-10-CM | POA: Insufficient documentation

## 2014-05-31 DIAGNOSIS — I10 Essential (primary) hypertension: Secondary | ICD-10-CM | POA: Insufficient documentation

## 2014-05-31 DIAGNOSIS — E079 Disorder of thyroid, unspecified: Secondary | ICD-10-CM | POA: Insufficient documentation

## 2014-05-31 DIAGNOSIS — E119 Type 2 diabetes mellitus without complications: Secondary | ICD-10-CM | POA: Diagnosis not present

## 2014-05-31 DIAGNOSIS — S0181XA Laceration without foreign body of other part of head, initial encounter: Secondary | ICD-10-CM

## 2014-05-31 DIAGNOSIS — S0990XA Unspecified injury of head, initial encounter: Secondary | ICD-10-CM | POA: Diagnosis not present

## 2014-05-31 DIAGNOSIS — Z792 Long term (current) use of antibiotics: Secondary | ICD-10-CM | POA: Diagnosis not present

## 2014-05-31 DIAGNOSIS — S199XXA Unspecified injury of neck, initial encounter: Secondary | ICD-10-CM | POA: Diagnosis not present

## 2014-05-31 DIAGNOSIS — S0542XA Penetrating wound of orbit with or without foreign body, left eye, initial encounter: Secondary | ICD-10-CM | POA: Diagnosis not present

## 2014-05-31 DIAGNOSIS — Z7952 Long term (current) use of systemic steroids: Secondary | ICD-10-CM | POA: Insufficient documentation

## 2014-05-31 DIAGNOSIS — Z23 Encounter for immunization: Secondary | ICD-10-CM | POA: Diagnosis not present

## 2014-05-31 DIAGNOSIS — Z7982 Long term (current) use of aspirin: Secondary | ICD-10-CM | POA: Diagnosis not present

## 2014-05-31 DIAGNOSIS — Y9289 Other specified places as the place of occurrence of the external cause: Secondary | ICD-10-CM | POA: Diagnosis not present

## 2014-05-31 DIAGNOSIS — W19XXXA Unspecified fall, initial encounter: Secondary | ICD-10-CM

## 2014-05-31 DIAGNOSIS — S01111A Laceration without foreign body of right eyelid and periocular area, initial encounter: Secondary | ICD-10-CM | POA: Diagnosis not present

## 2014-05-31 DIAGNOSIS — F0390 Unspecified dementia without behavioral disturbance: Secondary | ICD-10-CM | POA: Diagnosis not present

## 2014-05-31 DIAGNOSIS — Z79899 Other long term (current) drug therapy: Secondary | ICD-10-CM | POA: Insufficient documentation

## 2014-05-31 MED ORDER — CEPHALEXIN 500 MG PO CAPS: 500.0000 mg | ORAL_CAPSULE | Freq: Four times a day (QID) | ORAL | Status: DC

## 2014-05-31 MED ORDER — LIDOCAINE-EPINEPHRINE (PF) 1 %-1:200000 IJ SOLN
20.0000 mL | Freq: Once | INTRAMUSCULAR | Status: DC
Start: 2014-05-31 — End: 2014-05-31
  Filled 2014-05-31: qty 20

## 2014-05-31 MED ORDER — CEPHALEXIN 250 MG PO CAPS
500.0000 mg | ORAL_CAPSULE | Freq: Once | ORAL | Status: AC
  Administered 2014-05-31: 500 mg via ORAL
  Filled 2014-05-31: qty 2

## 2014-05-31 MED ORDER — LIDOCAINE-EPINEPHRINE (PF) 2 %-1:200000 IJ SOLN: 20.0000 mL | Freq: Once | INTRAMUSCULAR | Status: DC

## 2014-05-31 MED ORDER — LIDOCAINE-EPINEPHRINE 2 %-1:100000 IJ SOLN
INTRAMUSCULAR | Status: AC
  Filled 2014-05-31: qty 1

## 2014-05-31 MED ORDER — LIDOCAINE-EPINEPHRINE-TETRACAINE (LET) SOLUTION
3.0000 mL | Freq: Once | NASAL | Status: AC
  Administered 2014-05-31: 3 mL via TOPICAL
  Filled 2014-05-31: qty 3

## 2014-05-31 MED ORDER — TETANUS-DIPHTH-ACELL PERTUSSIS 5-2.5-18.5 LF-MCG/0.5 IM SUSP
0.5000 mL | Freq: Once | INTRAMUSCULAR | Status: AC
  Administered 2014-05-31: 0.5 mL via INTRAMUSCULAR
  Filled 2014-05-31: qty 0.5

## 2014-05-31 NOTE — Telephone Encounter (Signed)
Referral sent to [facility]

## 2014-05-31 NOTE — ED Notes (Signed)
Pt has large laceration to left eyebrow, bleeding controlled. No reported LOC

## 2014-05-31 NOTE — ED Notes (Signed)
Pt from group home. Sts he fell over his own feet and onto the dresser. Pt is his baseline according to group home manager. Pt denies LOC. Denies nausea.

## 2014-05-31 NOTE — ED Provider Notes (Signed)
CSN: [id number]     Arrival date & time 05/31/14  1410 History   First MD Initiated Contact with Patient 05/31/14 1425     Chief Complaint  Patient presents with  . Head Injury     (Consider location/radiation/quality/duration/timing/severity/associated sxs/prior Treatment) HPI   ARHAAN CHESNUT is a 68 y.o. male complaining of Unwitnessed fall with laceration to right eyebrow just prior to arrival. Patient is not taking any anticoagulation, has history of diabetes and dementia. Patient is mentating at his baseline as per guardian from group home. Last tetanus shot is unknown. Level V caveat secondary to dementia.  Past Medical History  Diagnosis Date  . Diabetes mellitus   . Urinary incontinence   . Mental retardation   . Schizophrenia   . BPH (benign prostatic hyperplasia)   . Thyroid disease   . Hypertension    Past Surgical History  Procedure Laterality Date  . Prostate surgery      x2, ?procedure   Family History  Problem Relation Age of Onset  . Arthritis Mother   . Arrhythmia Mother   . Lymphoma Mother     non-hodgkins  . Heart disease Father   . Stroke Father    History  Substance Use Topics  . Smoking status: Never Smoker   . Smokeless tobacco: Current User     Comment: chews tobacco  . Alcohol Use: No    Review of Systems  Unable to perform ROS: Dementia      Allergies  Review of patient's allergies indicates no known allergies.  Home Medications   Prior to Admission medications   Medication Sig Start Date End Date Taking? Authorizing Provider  Acetaminophen (ACETAMINOPHEN EXTRA STRENGTH) 167 MG/5ML LIQD Take 15 mLs by mouth.    Historical Provider, MD  albuterol (PROVENTIL HFA;VENTOLIN HFA) 108 (90 BASE) MCG/ACT inhaler 2 puffs BID for 5 days, then q6h PRN cough. 09/06/13   Irene Pap, NP  amoxicillin-clavulanate (AUGMENTIN) 875-125 MG per tablet Take 1 tablet by mouth 2 (two) times daily. 05/27/14   Debbrah Alar, NP  aspirin 81 MG  tablet Take 81 mg by mouth daily.    Historical Provider, MD  cephALEXin (KEFLEX) 500 MG capsule Take 1 capsule (500 mg total) by mouth 4 (four) times daily. 05/31/14   Lavetta Geier, PA-C  clotrimazole (LOTRIMIN) 1 % cream Apply 1 application topically daily as needed.    Historical Provider, MD  Control Gel Formula Dressing (DUODERM CGF DRESSING) MISC Apply 1 each topically every 3 (three) days. 05/06/14   Debbrah Alar, NP  desonide (DESOWEN) 0.05 % lotion Apply 1 application topically 3 (three) times a week.    Historical Provider, MD  diphenhydrAMINE (BENADRYL) 25 mg capsule Take 2 capsules (50 mg total) by mouth at bedtime. 12/21/13   Debbrah Alar, NP  dutasteride (AVODART) 0.5 MG capsule Take 0.5 mg by mouth daily. Do Not Crush    Historical Provider, MD  Emollient (CERAVE) CREA Apply 1 application topically. For dry skin on feet and legs    Historical Provider, MD  ferrous sulfate 325 (65 FE) MG tablet Take 1 tablet (325 mg total) by mouth 2 (two) times daily with a meal. 05/07/14   Debbrah Alar, NP  FLUoxetine (PROZAC) 20 MG capsule Take 20 mg by mouth daily.    Historical Provider, MD  ketoconazole (NIZORAL) 2 % cream Apply 1 application topically 2 (two) times daily. For face    Historical Provider, MD  levothyroxine (SYNTHROID, LEVOTHROID) 50 MCG tablet TAKE  1 TABLET BY MOUTH EVERY MORNING  COURTESY FILL 07/20/13   Debbrah Alar, NP  lip balm (BLISTEX) OINT Apply 1 application topically 4 (four) times daily as needed for lip care.    Historical Provider, MD  Loperamide HCl (IMODIUM A-D PO) Take by mouth as needed.    Historical Provider, MD  QUEtiapine (SEROQUEL) 50 MG tablet Take 50 mg by mouth 2 (two) times daily.    Historical Provider, MD  Simethicone (MYLANTA GAS PO) Take by mouth as needed.    Historical Provider, MD  TAMSULOSIN HCL PO Take 0.4 mg by mouth every morning.     Historical Provider, MD  thioridazine (MELLARIL) 50 MG tablet Take 1 tablet (50 mg  total) by mouth every morning. 07/21/12   Norma Fredrickson, MD  thioridazine (MELLARIL) 50 MG tablet Take 200 mg by mouth at bedtime.    Historical Provider, MD   BP 114/57 mmHg  Pulse 78  Temp(Src) 97.6 F (36.4 C) (Oral)  Resp 16  SpO2 100% Physical Exam  Constitutional: He is oriented to person, place, and time. He appears well-developed and well-nourished. No distress.  HENT:  Head: Normocephalic.  Mouth/Throat: Oropharynx is clear and moist.  4 cm full-thickness laceration to right eyebrow. Bleeding is controlled.  No hemotympanum, battle signs or raccoon's eyes  No crepitance or tenderness to palpation along the orbital rim.  EOMI intact with no pain or diplopia  No abnormal otorrhea or rhinorrhea. Nasal septum midline.  No intraoral trauma.  Eyes: Conjunctivae and EOM are normal. Pupils are equal, round, and reactive to light.  Neck: Normal range of motion. Neck supple.  No midline C-spine  tenderness to palpation or step-offs appreciated. Patient has full range of motion without pain.   Cardiovascular: Normal rate, regular rhythm and intact distal pulses.   Pulmonary/Chest: Effort normal and breath sounds normal. No stridor. No respiratory distress. He has no wheezes. He has no rales. He exhibits no tenderness.  No TTP or crepitance  Abdominal: Soft. Bowel sounds are normal. He exhibits no distension and no mass. There is no tenderness. There is no rebound and no guarding.  Musculoskeletal: Normal range of motion. He exhibits no edema or tenderness.  Pelvis stable. No deformity or TTP of major joints.   Good ROM  Neurological: He is alert and oriented to person, place, and time.  MAE  Skin: Skin is warm.  Psychiatric: He has a normal mood and affect.  Nursing note and vitals reviewed.   ED Course  LACERATION REPAIR Date/Time: 05/31/2014 4:38 PM Performed by: Monico Blitz Authorized by: Monico Blitz Consent given by: guardian Patient identity confirmed:  verbally with patient Body area: head/neck Location details: left eyelid Laceration length: 4 cm Foreign bodies: no foreign bodies Tendon involvement: none Nerve involvement: none Vascular damage: no Anesthesia: local infiltration Local anesthetic: lidocaine 2% with epinephrine Anesthetic total: 5 ml Patient sedated: no Preparation: Patient was prepped and draped in the usual sterile fashion. Irrigation solution: saline Irrigation method: syringe Amount of cleaning: extensive Debridement: none Degree of undermining: none Wound skin closure material used: 4-0 ethilon. Subcutaneous closure: 6-0 fast-absorbing plain gut Number of sutures: 6 Technique: simple and running Approximation: close Approximation difficulty: simple Dressing: antibiotic ointment, 4x4 sterile gauze and gauze roll Patient tolerance: Patient tolerated the procedure well with no immediate complications   (including critical care time) Labs Review Labs Reviewed - No data to display  Imaging Review Ct Head Wo Contrast  05/31/2014   CLINICAL DATA:  Fall, left  supraorbital laceration  EXAM: CT HEAD WITHOUT CONTRAST  CT MAXILLOFACIAL WITHOUT CONTRAST  CT CERVICAL SPINE WITHOUT CONTRAST  TECHNIQUE: Multidetector CT imaging of the head, cervical spine, and maxillofacial structures were performed using the standard protocol without intravenous contrast. Multiplanar CT image reconstructions of the cervical spine and maxillofacial structures were also generated.  COMPARISON:  02/05/2013 CT head/C-spine  FINDINGS: CT HEAD FINDINGS  There is extensive motion artifact, degrading imaging. Images were repeated. Left periorbital soft tissue swelling is incompletely visualized. Right occipital soft tissue swelling with extension to skin is reidentified, image 10 series 4 which likely indicates previous scarring or mass but is stable. Minimal soft tissue density in the external auditory canals, left greater than right, compatible with  cerumen. No visualized skull fracture. No acute hemorrhage, infarct, or mass lesion is identified.  CT MAXILLOFACIAL FINDINGS  The patient is partly edentulous. Dental caries noted. Maxillary sinus and ethmoid sinus mucoperiosteal thickening is noted. Left supraorbital soft tissue swelling and laceration are identified. There is minimal left-sided preseptal soft tissue swelling but the intraconal fat is clean. No facial bone fracture is identified; the zygomatic arches, orbital walls, nasal bones, and mandible are intact. Mild mucoperiosteal thickening is noted involving both ostiomeatal units.  CT CERVICAL SPINE FINDINGS  C1 through the cervicothoracic junction is visualized in its entirety. Vertebral body heights are preserved. Disc degenerative change noted C5-C6 and C6-C7 with mild bilateral neural foraminal narrowing at C6-C7. No precervical soft tissue widening. Multilevel facet osteoarthritic change is noted. No fracture is identified. Secretions are noted within the upper trachea image 86.  IMPRESSION: Left supraorbital soft tissue swelling/laceration but no acute intracranial abnormality or evidence for facial bone fracture.  Extensive dental caries.  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  Suboptimal exam due to motion.   Electronically Signed   By: Conchita Paris M.D.   On: 05/31/2014 15:42   Ct Cervical Spine Wo Contrast  05/31/2014   CLINICAL DATA:  Fall, left supraorbital laceration  EXAM: CT HEAD WITHOUT CONTRAST  CT MAXILLOFACIAL WITHOUT CONTRAST  CT CERVICAL SPINE WITHOUT CONTRAST  TECHNIQUE: Multidetector CT imaging of the head, cervical spine, and maxillofacial structures were performed using the standard protocol without intravenous contrast. Multiplanar CT image reconstructions of the cervical spine and maxillofacial structures were also generated.  COMPARISON:  02/05/2013 CT head/C-spine  FINDINGS: CT HEAD FINDINGS   There is extensive motion artifact, degrading imaging. Images were repeated. Left periorbital soft tissue swelling is incompletely visualized. Right occipital soft tissue swelling with extension to skin is reidentified, image 10 series 4 which likely indicates previous scarring or mass but is stable. Minimal soft tissue density in the external auditory canals, left greater than right, compatible with cerumen. No visualized skull fracture. No acute hemorrhage, infarct, or mass lesion is identified.  CT MAXILLOFACIAL FINDINGS  The patient is partly edentulous. Dental caries noted. Maxillary sinus and ethmoid sinus mucoperiosteal thickening is noted. Left supraorbital soft tissue swelling and laceration are identified. There is minimal left-sided preseptal soft tissue swelling but the intraconal fat is clean. No facial bone fracture is identified; the zygomatic arches, orbital walls, nasal bones, and mandible are intact. Mild mucoperiosteal thickening is noted involving both ostiomeatal units.  CT CERVICAL SPINE FINDINGS  C1 through the cervicothoracic junction is visualized in its entirety. Vertebral body heights are preserved. Disc degenerative change noted C5-C6 and C6-C7 with mild bilateral neural foraminal narrowing at C6-C7. No precervical soft tissue widening. Multilevel facet osteoarthritic  change is noted. No fracture is identified. Secretions are noted within the upper trachea image 86.  IMPRESSION: Left supraorbital soft tissue swelling/laceration but no acute intracranial abnormality or evidence for facial bone fracture.  Extensive dental caries.  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  Suboptimal exam due to motion.   Electronically Signed   By: Conchita Paris M.D.   On: 05/31/2014 15:42   Ct Maxillofacial Wo Cm  05/31/2014   CLINICAL DATA:  Fall, left supraorbital laceration  EXAM: CT HEAD WITHOUT CONTRAST  CT MAXILLOFACIAL  WITHOUT CONTRAST  CT CERVICAL SPINE WITHOUT CONTRAST  TECHNIQUE: Multidetector CT imaging of the head, cervical spine, and maxillofacial structures were performed using the standard protocol without intravenous contrast. Multiplanar CT image reconstructions of the cervical spine and maxillofacial structures were also generated.  COMPARISON:  02/05/2013 CT head/C-spine  FINDINGS: CT HEAD FINDINGS  There is extensive motion artifact, degrading imaging. Images were repeated. Left periorbital soft tissue swelling is incompletely visualized. Right occipital soft tissue swelling with extension to skin is reidentified, image 10 series 4 which likely indicates previous scarring or mass but is stable. Minimal soft tissue density in the external auditory canals, left greater than right, compatible with cerumen. No visualized skull fracture. No acute hemorrhage, infarct, or mass lesion is identified.  CT MAXILLOFACIAL FINDINGS  The patient is partly edentulous. Dental caries noted. Maxillary sinus and ethmoid sinus mucoperiosteal thickening is noted. Left supraorbital soft tissue swelling and laceration are identified. There is minimal left-sided preseptal soft tissue swelling but the intraconal fat is clean. No facial bone fracture is identified; the zygomatic arches, orbital walls, nasal bones, and mandible are intact. Mild mucoperiosteal thickening is noted involving both ostiomeatal units.  CT CERVICAL SPINE FINDINGS  C1 through the cervicothoracic junction is visualized in its entirety. Vertebral body heights are preserved. Disc degenerative change noted C5-C6 and C6-C7 with mild bilateral neural foraminal narrowing at C6-C7. No precervical soft tissue widening. Multilevel facet osteoarthritic change is noted. No fracture is identified. Secretions are noted within the upper trachea image 86.  IMPRESSION: Left supraorbital soft tissue swelling/laceration but no acute intracranial abnormality or evidence for facial bone  fracture.  Extensive dental caries.  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  Suboptimal exam due to motion.   Electronically Signed   By: Conchita Paris M.D.   On: 05/31/2014 15:42     EKG Interpretation None      MDM   Final diagnoses:  Fall  Facial laceration, initial encounter    Filed Vitals:   05/31/14 1418  BP: 114/57  Pulse: 78  Temp: 97.6 F (36.4 C)  TempSrc: Oral  Resp: 16  SpO2: 100%    Medications  lidocaine-EPINEPHrine (XYLOCAINE W/EPI) 2 %-1:100000 (with pres) injection (not administered)  lidocaine-EPINEPHrine (XYLOCAINE W/EPI) 2 %-1:200000 (PF) injection 20 mL (not administered)  cephALEXin (KEFLEX) capsule 500 mg (not administered)  Tdap (BOOSTRIX) injection 0.5 mL (0.5 mLs Intramuscular Given 05/31/14 1454)  lidocaine-EPINEPHrine-tetracaine (LET) solution (3 mLs Topical Given 05/31/14 1453)    GILAD DUGGER is a 68 y.o. male presenting with Frontal head trauma status post unwitnessed fall. Patient is demented however neurologic exam is grossly nonfocal. CT head, maxillofacial and C-spine are negative. Laceration is closed with inner layer of Vicryl Rapide and outer layer of 4-0 Ethilon in combination of running locking and simple interrupted sutures. Patient will be started on Keflex prophylactically as he is  diabetic.  This is a shared visit with the attending physician who personally evaluated the patient and agrees with the care plan.   Evaluation does not show pathology that would require ongoing emergent intervention or inpatient treatment. Pt is hemodynamically stable and mentating appropriately. Discussed findings and plan with patient/guardian, who agrees with care plan. All questions answered. Return precautions discussed and outpatient follow up given.   New Prescriptions   CEPHALEXIN (KEFLEX) 500 MG CAPSULE    Take 1 capsule (500 mg total) by mouth 4 (four) times daily.          Monico Blitz, PA-C 05/31/14 [place], MD 05/31/14 Johnnye Lana

## 2014-05-31 NOTE — Discharge Instructions (Signed)
Wash gently morning and night (every 12 hours) with soap and water. Use a topical antibiotic ointment and cover with a bandaid or gauze.    Do NOT use rubbing alcohol or hydrogen peroxide, do not soak the area   Present to your primary care doctor or the urgent care of your choice, or the ED for suture removal in 7-10 days.   Every attempt was made to remove foreign body (contaminants) from the wound.  However, there is always a chance that some may remain in the wound. This can  increase your risk of infection.   If you see signs of infection (warmth, redness, tenderness, pus, sharp increase in pain, fever, red streaking in the skin) immediately return to the emergency department.   After the wound heals fully, apply sunscreen for 6-12 months to minimize scarring.    Facial Laceration  A facial laceration is a cut on the face. These injuries can be painful and cause bleeding. Lacerations usually heal quickly, but they need special care to reduce scarring. DIAGNOSIS  Your health care provider will take a medical history, ask for details about how the injury occurred, and examine the wound to determine how deep the cut is. TREATMENT  Some facial lacerations may not require closure. Others may not be able to be closed because of an increased risk of infection. The risk of infection and the chance for successful closure will depend on various factors, including the amount of time since the injury occurred. The wound may be cleaned to help prevent infection. If closure is appropriate, pain medicines may be given if needed. Your health care provider will use stitches (sutures), wound glue (adhesive), or skin adhesive strips to repair the laceration. These tools bring the skin edges together to allow for faster healing and a better cosmetic outcome. If needed, you may also be given a tetanus shot. HOME CARE INSTRUCTIONS  Only take over-the-counter or prescription medicines as directed by your health  care provider.  Follow your health care provider's instructions for wound care. These instructions will vary depending on the technique used for closing the wound. For Sutures:  Keep the wound clean and dry.   If you were given a bandage (dressing), you should change it at least once a day. Also change the dressing if it becomes wet or dirty, or as directed by your health care provider.   Wash the wound with soap and water 2 times a day. Rinse the wound off with water to remove all soap. Pat the wound dry with a clean towel.   After cleaning, apply a thin layer of the antibiotic ointment recommended by your health care provider. This will help prevent infection and keep the dressing from sticking.   You may shower as usual after the first 24 hours. Do not soak the wound in water until the sutures are removed.   Get your sutures removed as directed by your health care provider. With facial lacerations, sutures should usually be taken out after 4-5 days to avoid stitch marks.   Wait a few days after your sutures are removed before applying any makeup. For Skin Adhesive Strips:  Keep the wound clean and dry.   Do not get the skin adhesive strips wet. You may bathe carefully, using caution to keep the wound dry.   If the wound gets wet, pat it dry with a clean towel.   Skin adhesive strips will fall off on their own. You may trim the strips as the  wound heals. Do not remove skin adhesive strips that are still stuck to the wound. They will fall off in time.  For Wound Adhesive:  You may briefly wet your wound in the shower or bath. Do not soak or scrub the wound. Do not swim. Avoid periods of heavy sweating until the skin adhesive has fallen off on its own. After showering or bathing, gently pat the wound dry with a clean towel.   Do not apply liquid medicine, cream medicine, ointment medicine, or makeup to your wound while the skin adhesive is in place. This may loosen the film  before your wound is healed.   If a dressing is placed over the wound, be careful not to apply tape directly over the skin adhesive. This may cause the adhesive to be pulled off before the wound is healed.   Avoid prolonged exposure to sunlight or tanning lamps while the skin adhesive is in place.  The skin adhesive will usually remain in place for 5-10 days, then naturally fall off the skin. Do not pick at the adhesive film.  After Healing: Once the wound has healed, cover the wound with sunscreen during the day for 1 full year. This can help minimize scarring. Exposure to ultraviolet light in the first year will darken the scar. It can take 1-2 years for the scar to lose its redness and to heal completely.  SEEK IMMEDIATE MEDICAL CARE IF:  You have redness, pain, or swelling around the wound.   You see ayellowish-white fluid (pus) coming from the wound.   You have chills or a fever.  MAKE SURE YOU:  Understand these instructions.  Will watch your condition.  Will get help right away if you are not doing well or get worse. Document Released: 08/19/2004 Document Revised: 05/02/2013 Document Reviewed: 02/22/2013 [facility] Patient Information 2015 [place], [place]. This information is not intended to replace advice given to you by your health care provider. Make sure you discuss any questions you have with your health care provider.

## 2014-06-02 DIAGNOSIS — R262 Difficulty in walking, not elsewhere classified: Secondary | ICD-10-CM | POA: Diagnosis not present

## 2014-06-02 DIAGNOSIS — E119 Type 2 diabetes mellitus without complications: Secondary | ICD-10-CM | POA: Diagnosis not present

## 2014-06-02 DIAGNOSIS — F209 Schizophrenia, unspecified: Secondary | ICD-10-CM | POA: Diagnosis not present

## 2014-06-02 DIAGNOSIS — M6281 Muscle weakness (generalized): Secondary | ICD-10-CM | POA: Diagnosis not present

## 2014-06-02 DIAGNOSIS — G2401 Drug induced subacute dyskinesia: Secondary | ICD-10-CM | POA: Diagnosis not present

## 2014-06-03 ENCOUNTER — Telehealth: Payer: Self-pay | Admitting: Family

## 2014-06-03 DIAGNOSIS — M6281 Muscle weakness (generalized): Secondary | ICD-10-CM | POA: Diagnosis not present

## 2014-06-03 DIAGNOSIS — N390 Urinary tract infection, site not specified: Secondary | ICD-10-CM | POA: Diagnosis not present

## 2014-06-03 DIAGNOSIS — E119 Type 2 diabetes mellitus without complications: Secondary | ICD-10-CM | POA: Diagnosis not present

## 2014-06-03 DIAGNOSIS — R262 Difficulty in walking, not elsewhere classified: Secondary | ICD-10-CM | POA: Diagnosis not present

## 2014-06-03 DIAGNOSIS — F209 Schizophrenia, unspecified: Secondary | ICD-10-CM | POA: Diagnosis not present

## 2014-06-03 NOTE — Telephone Encounter (Signed)
Patient will need sutures removed 5 days after placement. Please arrange office visit.

## 2014-06-04 DIAGNOSIS — N390 Urinary tract infection, site not specified: Secondary | ICD-10-CM | POA: Diagnosis not present

## 2014-06-04 DIAGNOSIS — R262 Difficulty in walking, not elsewhere classified: Secondary | ICD-10-CM | POA: Diagnosis not present

## 2014-06-04 DIAGNOSIS — M6281 Muscle weakness (generalized): Secondary | ICD-10-CM | POA: Diagnosis not present

## 2014-06-04 DIAGNOSIS — E119 Type 2 diabetes mellitus without complications: Secondary | ICD-10-CM | POA: Diagnosis not present

## 2014-06-04 DIAGNOSIS — F209 Schizophrenia, unspecified: Secondary | ICD-10-CM | POA: Diagnosis not present

## 2014-06-04 NOTE — Telephone Encounter (Signed)
Spoke with [name] and scheduled f/u for 06/06/14 at 1:30pm.

## 2014-06-05 DIAGNOSIS — M6281 Muscle weakness (generalized): Secondary | ICD-10-CM | POA: Diagnosis not present

## 2014-06-05 DIAGNOSIS — E119 Type 2 diabetes mellitus without complications: Secondary | ICD-10-CM | POA: Diagnosis not present

## 2014-06-05 DIAGNOSIS — R262 Difficulty in walking, not elsewhere classified: Secondary | ICD-10-CM | POA: Diagnosis not present

## 2014-06-05 DIAGNOSIS — F209 Schizophrenia, unspecified: Secondary | ICD-10-CM | POA: Diagnosis not present

## 2014-06-05 DIAGNOSIS — N390 Urinary tract infection, site not specified: Secondary | ICD-10-CM | POA: Diagnosis not present

## 2014-06-06 ENCOUNTER — Encounter: Payer: Self-pay | Admitting: Family

## 2014-06-06 ENCOUNTER — Ambulatory Visit (INDEPENDENT_AMBULATORY_CARE_PROVIDER_SITE_OTHER): Payer: Medicare Other | Admitting: Family

## 2014-06-06 ENCOUNTER — Telehealth: Payer: Self-pay | Admitting: Family

## 2014-06-06 VITALS — BP 104/62 | HR 84 | Temp 97.5°F | Resp 18 | Ht 68.5 in | Wt 175.0 lb

## 2014-06-06 DIAGNOSIS — T148 Other injury of unspecified body region: Secondary | ICD-10-CM

## 2014-06-06 DIAGNOSIS — H524 Presbyopia: Secondary | ICD-10-CM | POA: Diagnosis not present

## 2014-06-06 DIAGNOSIS — E119 Type 2 diabetes mellitus without complications: Secondary | ICD-10-CM | POA: Diagnosis not present

## 2014-06-06 DIAGNOSIS — I951 Orthostatic hypotension: Secondary | ICD-10-CM | POA: Insufficient documentation

## 2014-06-06 DIAGNOSIS — H2513 Age-related nuclear cataract, bilateral: Secondary | ICD-10-CM | POA: Diagnosis not present

## 2014-06-06 DIAGNOSIS — M6281 Muscle weakness (generalized): Secondary | ICD-10-CM | POA: Diagnosis not present

## 2014-06-06 DIAGNOSIS — F209 Schizophrenia, unspecified: Secondary | ICD-10-CM | POA: Diagnosis not present

## 2014-06-06 DIAGNOSIS — N390 Urinary tract infection, site not specified: Secondary | ICD-10-CM | POA: Diagnosis not present

## 2014-06-06 DIAGNOSIS — R262 Difficulty in walking, not elsewhere classified: Secondary | ICD-10-CM | POA: Diagnosis not present

## 2014-06-06 DIAGNOSIS — IMO0002 Reserved for concepts with insufficient information to code with codable children: Secondary | ICD-10-CM

## 2014-06-06 NOTE — Assessment & Plan Note (Addendum)
I am suspicious of seroquel being a contributing factor. In the past when I have discussed possible psych med change with his sister, she has been resistant to changing his meds. I have requested that they discuss this with his psychiatrist.  See phone note.

## 2014-06-06 NOTE — Progress Notes (Signed)
   Subjective:    Patient ID: Edward [name], male    DOB: [birth year], 68 y.o.   MRN: [id number]  HPI  Edward [name] is a 68 yr old male who presents today for suture removal. He was seen in the ED 1 week ago following an unwitnessed fall.  The patient reports that he tripped and did not lose consciousness.  He apparently struck his left brow on the dresser and suffered a laceration which required stitches.  He is here today for suture removal. He is accompanied today by caregiver from his group home.    Review of Systems See HPI  Past Medical History  Diagnosis Date  . Diabetes mellitus   . Urinary incontinence   . Mental retardation   . Schizophrenia   . BPH (benign prostatic hyperplasia)   . Thyroid disease   . Hypertension     History   Social History  . Marital Status: Single    Spouse Name: N/A    Number of Children: N/A  . Years of Education: N/A   Occupational History  . Not on file.   Social History Main Topics  . Smoking status: Never Smoker   . Smokeless tobacco: Current User     Comment: chews tobacco  . Alcohol Use: No  . Drug Use: No  . Sexual Activity: Not on file   Other Topics Concern  . Not on file   Social History Narrative   Lives in a group home   Sister Edward [name] is his legal guardian.   Sister and her family live locally    Past Surgical History  Procedure Laterality Date  . Prostate surgery      x2, ?procedure    Family History  Problem Relation Age of Onset  . Arthritis Mother   . Arrhythmia Mother   . Lymphoma Mother     non-hodgkins  . Heart disease Father   . Stroke Father     No Known Allergies    BP 104/62 mmHg  Pulse 84  Temp(Src) 97.5 F (36.4 C) (Oral)  Resp 18  Ht 5' 8.5" (1.74 m)  Wt 175 lb (79.379 kg)  BMI 26.22 kg/m2  SpO2 99%       Objective:   Physical Exam  Constitutional: He appears well-developed and well-nourished. No distress.  HENT:  + ecchymosis left upper/lateral orbit.   Skin:    Laceration left brow, well healed without erythema, sutures intact.           Assessment & Plan:

## 2014-06-06 NOTE — Progress Notes (Signed)
Pre visit review using our clinic review tool, if applicable. No additional management support is needed unless otherwise documented below in the visit note. 

## 2014-06-06 NOTE — Patient Instructions (Addendum)
Call if you develop redness or swelling above left eyebrow.   Follow up on 11/23 as scheduled.

## 2014-06-06 NOTE — Telephone Encounter (Signed)
Please contact sister and let her know that his blood pressure did drop when he rose from laying to standing. This could be contributing to his dizziness and fall.  I reviewed his medication list and seroquel is the most likely medication on his med list to have this side effect.  I would recommend that they address this with his psychiatrist.

## 2014-06-06 NOTE — Assessment & Plan Note (Signed)
Sutures removed today without difficulty. Pt tolerated procedure well.

## 2014-06-07 DIAGNOSIS — E119 Type 2 diabetes mellitus without complications: Secondary | ICD-10-CM | POA: Diagnosis not present

## 2014-06-07 DIAGNOSIS — F209 Schizophrenia, unspecified: Secondary | ICD-10-CM | POA: Diagnosis not present

## 2014-06-07 DIAGNOSIS — M6281 Muscle weakness (generalized): Secondary | ICD-10-CM | POA: Diagnosis not present

## 2014-06-07 DIAGNOSIS — R262 Difficulty in walking, not elsewhere classified: Secondary | ICD-10-CM | POA: Diagnosis not present

## 2014-06-07 DIAGNOSIS — N390 Urinary tract infection, site not specified: Secondary | ICD-10-CM | POA: Diagnosis not present

## 2014-06-07 NOTE — Telephone Encounter (Signed)
Notified pt's sister and she voices understanding.

## 2014-06-10 ENCOUNTER — Telehealth: Payer: Self-pay | Admitting: *Deleted

## 2014-06-10 NOTE — Telephone Encounter (Signed)
Received home health certification and plan of care via fax from [facility]. Forwarded to [employer]. JG//CMA

## 2014-06-10 NOTE — Telephone Encounter (Signed)
Forms faxed to [facility]. JG//CMA

## 2014-06-11 DIAGNOSIS — R262 Difficulty in walking, not elsewhere classified: Secondary | ICD-10-CM | POA: Diagnosis not present

## 2014-06-11 DIAGNOSIS — F209 Schizophrenia, unspecified: Secondary | ICD-10-CM | POA: Diagnosis not present

## 2014-06-11 DIAGNOSIS — M6281 Muscle weakness (generalized): Secondary | ICD-10-CM | POA: Diagnosis not present

## 2014-06-11 DIAGNOSIS — N390 Urinary tract infection, site not specified: Secondary | ICD-10-CM | POA: Diagnosis not present

## 2014-06-11 DIAGNOSIS — E119 Type 2 diabetes mellitus without complications: Secondary | ICD-10-CM | POA: Diagnosis not present

## 2014-06-12 ENCOUNTER — Telehealth: Payer: Self-pay | Admitting: Family

## 2014-06-12 DIAGNOSIS — M6281 Muscle weakness (generalized): Secondary | ICD-10-CM | POA: Diagnosis not present

## 2014-06-12 DIAGNOSIS — F209 Schizophrenia, unspecified: Secondary | ICD-10-CM | POA: Diagnosis not present

## 2014-06-12 DIAGNOSIS — E119 Type 2 diabetes mellitus without complications: Secondary | ICD-10-CM

## 2014-06-12 DIAGNOSIS — R262 Difficulty in walking, not elsewhere classified: Secondary | ICD-10-CM

## 2014-06-12 DIAGNOSIS — N390 Urinary tract infection, site not specified: Secondary | ICD-10-CM | POA: Diagnosis not present

## 2014-06-12 NOTE — Telephone Encounter (Signed)
Spoke with Coralyn Mark, he is requesting order for pt to see Orthotist to assess for foot drop (order must state this). Reports pt doesn't have adequate dorsiflexion of his left foot and he is hoping pt will qualify for a brace. He is concerned for potential falls in the future. He would also like an Rx for a rolling walker.

## 2014-06-12 NOTE — Telephone Encounter (Signed)
Caller name:Hunter, Terry-[facility] Relation to DY:JWLK Call back Eads:  Reason for call: Needing an order for orthotist consults for the pt. Please fax order [phone]

## 2014-06-12 NOTE — Telephone Encounter (Signed)
See rxs

## 2014-06-12 NOTE — Telephone Encounter (Signed)
Orders faxed to below number

## 2014-06-14 DIAGNOSIS — M6281 Muscle weakness (generalized): Secondary | ICD-10-CM | POA: Diagnosis not present

## 2014-06-14 DIAGNOSIS — E119 Type 2 diabetes mellitus without complications: Secondary | ICD-10-CM | POA: Diagnosis not present

## 2014-06-14 DIAGNOSIS — N390 Urinary tract infection, site not specified: Secondary | ICD-10-CM | POA: Diagnosis not present

## 2014-06-14 DIAGNOSIS — R262 Difficulty in walking, not elsewhere classified: Secondary | ICD-10-CM | POA: Diagnosis not present

## 2014-06-14 DIAGNOSIS — F209 Schizophrenia, unspecified: Secondary | ICD-10-CM | POA: Diagnosis not present

## 2014-06-17 ENCOUNTER — Ambulatory Visit (INDEPENDENT_AMBULATORY_CARE_PROVIDER_SITE_OTHER): Payer: Medicare Other | Admitting: Family

## 2014-06-17 ENCOUNTER — Encounter: Payer: Self-pay | Admitting: Family

## 2014-06-17 VITALS — BP 100/54 | HR 75 | Temp 97.7°F | Resp 18 | Ht 68.5 in | Wt 178.0 lb

## 2014-06-17 DIAGNOSIS — L89151 Pressure ulcer of sacral region, stage 1: Secondary | ICD-10-CM | POA: Diagnosis not present

## 2014-06-17 DIAGNOSIS — Z5181 Encounter for therapeutic drug level monitoring: Secondary | ICD-10-CM

## 2014-06-17 DIAGNOSIS — L98429 Non-pressure chronic ulcer of back with unspecified severity: Secondary | ICD-10-CM

## 2014-06-17 LAB — VALPROIC ACID LEVEL: Valproic Acid Lvl: 44.2 ug/mL — ABNORMAL LOW (ref 50.0–100.0)

## 2014-06-17 NOTE — Patient Instructions (Signed)
Please complete lab work prior to leaving. Follow up in 2 months.

## 2014-06-17 NOTE — Progress Notes (Signed)
   Subjective:    Patient ID: Edward [name], male    DOB: [birth year], 68 y.o.   MRN: [id number]  HPI  [name] is a 68 yr old male who presents today for follow up of his decubitus sacral ulcer.  Home health RN has been coming to the house- the caregivers note that the patient has not been provided a gel pad for his chair.   Caregiver notes that they believe pt was administered someone elses depakote on Saturday and Sunday and is requesting that we check his lab work.    Review of Systems See HPI  Past Medical History  Diagnosis Date  . Diabetes mellitus   . Urinary incontinence   . Mental retardation   . Schizophrenia   . BPH (benign prostatic hyperplasia)   . Thyroid disease   . Hypertension     History   Social History  . Marital Status: Single    Spouse Name: N/A    Number of Children: N/A  . Years of Education: N/A   Occupational History  . Not on file.   Social History Main Topics  . Smoking status: Never Smoker   . Smokeless tobacco: Current User     Comment: chews tobacco  . Alcohol Use: No  . Drug Use: No  . Sexual Activity: Not on file   Other Topics Concern  . Not on file   Social History Narrative   Lives in a group home   Sister [name] is his legal guardian.   Sister and her family live locally    Past Surgical History  Procedure Laterality Date  . Prostate surgery      x2, ?procedure    Family History  Problem Relation Age of Onset  . Arthritis Mother   . Arrhythmia Mother   . Lymphoma Mother     non-hodgkins  . Heart disease Father   . Stroke Father     No Known Allergies       Objective:   Physical Exam  Constitutional: He appears well-developed and well-nourished. No distress.  Cardiovascular: Normal rate and regular rhythm.   No murmur heard. Pulmonary/Chest: Effort normal and breath sounds normal. No respiratory distress. He has no wheezes. He has no rales. He exhibits no tenderness.  Neurological: He is alert.    Skin:  Sacral decub ulcer is now has intact skin in center is dry and healing, less erythema noted.           Assessment & Plan:  Blood work does show trace amount of depakote.  Pt does not show any adverse effects. Home and sister will be notified.

## 2014-06-18 ENCOUNTER — Telehealth: Payer: Self-pay | Admitting: *Deleted

## 2014-06-18 DIAGNOSIS — E119 Type 2 diabetes mellitus without complications: Secondary | ICD-10-CM | POA: Diagnosis not present

## 2014-06-18 DIAGNOSIS — M6281 Muscle weakness (generalized): Secondary | ICD-10-CM | POA: Diagnosis not present

## 2014-06-18 DIAGNOSIS — N390 Urinary tract infection, site not specified: Secondary | ICD-10-CM | POA: Diagnosis not present

## 2014-06-18 DIAGNOSIS — R262 Difficulty in walking, not elsewhere classified: Secondary | ICD-10-CM | POA: Diagnosis not present

## 2014-06-18 DIAGNOSIS — L89159 Pressure ulcer of sacral region, unspecified stage: Secondary | ICD-10-CM

## 2014-06-18 DIAGNOSIS — F209 Schizophrenia, unspecified: Secondary | ICD-10-CM | POA: Diagnosis not present

## 2014-06-18 NOTE — Telephone Encounter (Signed)
-----   Message from Debbrah Alar, NP sent at 06/17/2014 10:42 PM EST ----- Please contact pt's sister and let her know that the home requested that we check pt for depakote because they believed he received the wrong pt's meds over the weekend. There does appear to be a small amount of depakote in his system.  Unlikely to be dangerous for him.  Caregiver at home would also like to be notified please.

## 2014-06-18 NOTE — Telephone Encounter (Signed)
Notified [name] (caregiver). Left message for pt's sister to return my call.

## 2014-06-19 DIAGNOSIS — E119 Type 2 diabetes mellitus without complications: Secondary | ICD-10-CM | POA: Diagnosis not present

## 2014-06-19 DIAGNOSIS — F209 Schizophrenia, unspecified: Secondary | ICD-10-CM | POA: Diagnosis not present

## 2014-06-19 DIAGNOSIS — N390 Urinary tract infection, site not specified: Secondary | ICD-10-CM | POA: Diagnosis not present

## 2014-06-19 DIAGNOSIS — R262 Difficulty in walking, not elsewhere classified: Secondary | ICD-10-CM | POA: Diagnosis not present

## 2014-06-19 DIAGNOSIS — R339 Retention of urine, unspecified: Secondary | ICD-10-CM | POA: Diagnosis not present

## 2014-06-19 DIAGNOSIS — N139 Obstructive and reflux uropathy, unspecified: Secondary | ICD-10-CM | POA: Diagnosis not present

## 2014-06-19 DIAGNOSIS — M6281 Muscle weakness (generalized): Secondary | ICD-10-CM | POA: Diagnosis not present

## 2014-06-22 NOTE — Telephone Encounter (Signed)
Also- could you please contact home health agency and let them know that I would like for pt to have a gel pad for his chair and bed.

## 2014-06-22 NOTE — Assessment & Plan Note (Signed)
Improving.  Will request home health to provide gel pad for chair and bed.

## 2014-06-25 NOTE — Telephone Encounter (Signed)
Pylant,[name] (spouse) returning your call on behalf of the pt

## 2014-06-25 NOTE — Telephone Encounter (Signed)
Edward [name]-- Order printed for DME and forwarded to you for signature. Will fax to home health once you sign rx.  Notified pt's sister and she voices understanding.

## 2014-06-26 DIAGNOSIS — R262 Difficulty in walking, not elsewhere classified: Secondary | ICD-10-CM | POA: Diagnosis not present

## 2014-06-26 DIAGNOSIS — N390 Urinary tract infection, site not specified: Secondary | ICD-10-CM | POA: Diagnosis not present

## 2014-06-26 DIAGNOSIS — M6281 Muscle weakness (generalized): Secondary | ICD-10-CM | POA: Diagnosis not present

## 2014-06-26 DIAGNOSIS — E119 Type 2 diabetes mellitus without complications: Secondary | ICD-10-CM | POA: Diagnosis not present

## 2014-06-26 DIAGNOSIS — F209 Schizophrenia, unspecified: Secondary | ICD-10-CM | POA: Diagnosis not present

## 2014-06-26 NOTE — Telephone Encounter (Signed)
Rx faxed to [facility] at [phone].

## 2014-06-28 DIAGNOSIS — R262 Difficulty in walking, not elsewhere classified: Secondary | ICD-10-CM | POA: Diagnosis not present

## 2014-06-28 DIAGNOSIS — F209 Schizophrenia, unspecified: Secondary | ICD-10-CM | POA: Diagnosis not present

## 2014-06-28 DIAGNOSIS — N390 Urinary tract infection, site not specified: Secondary | ICD-10-CM | POA: Diagnosis not present

## 2014-06-28 DIAGNOSIS — E119 Type 2 diabetes mellitus without complications: Secondary | ICD-10-CM | POA: Diagnosis not present

## 2014-06-28 DIAGNOSIS — M6281 Muscle weakness (generalized): Secondary | ICD-10-CM | POA: Diagnosis not present

## 2014-06-30 DIAGNOSIS — Z5189 Encounter for other specified aftercare: Secondary | ICD-10-CM

## 2014-07-02 ENCOUNTER — Telehealth: Payer: Self-pay

## 2014-07-02 DIAGNOSIS — L89151 Pressure ulcer of sacral region, stage 1: Secondary | ICD-10-CM

## 2014-07-02 NOTE — Telephone Encounter (Signed)
Order needs to say "gel overlay and cushion" with notation as to where the ulcer is.  Printed new order with details corrected. Signed by Jacquelynn Cree

## 2014-07-02 NOTE — Telephone Encounter (Signed)
[facility] called to say they needed clarification on [facility] order. It says gel overlay  Then it says for bed and chair.

## 2014-07-03 DIAGNOSIS — E119 Type 2 diabetes mellitus without complications: Secondary | ICD-10-CM | POA: Diagnosis not present

## 2014-07-03 DIAGNOSIS — R262 Difficulty in walking, not elsewhere classified: Secondary | ICD-10-CM | POA: Diagnosis not present

## 2014-07-03 DIAGNOSIS — N390 Urinary tract infection, site not specified: Secondary | ICD-10-CM | POA: Diagnosis not present

## 2014-07-03 DIAGNOSIS — M6281 Muscle weakness (generalized): Secondary | ICD-10-CM | POA: Diagnosis not present

## 2014-07-03 DIAGNOSIS — F209 Schizophrenia, unspecified: Secondary | ICD-10-CM | POA: Diagnosis not present

## 2014-07-03 NOTE — Telephone Encounter (Signed)
Please advise 

## 2014-07-03 NOTE — Telephone Encounter (Signed)
Edward [name] from [facility] states that patient does not have a bed and chair and ins will not cover for the gel overlay and cushion because patient does not have a hospital bed or chair. [facility] # [phone]

## 2014-07-09 ENCOUNTER — Ambulatory Visit (INDEPENDENT_AMBULATORY_CARE_PROVIDER_SITE_OTHER): Payer: Medicare Other

## 2014-07-09 DIAGNOSIS — M79676 Pain in unspecified toe(s): Secondary | ICD-10-CM | POA: Diagnosis not present

## 2014-07-09 DIAGNOSIS — B351 Tinea unguium: Secondary | ICD-10-CM

## 2014-07-09 DIAGNOSIS — E1140 Type 2 diabetes mellitus with diabetic neuropathy, unspecified: Secondary | ICD-10-CM | POA: Diagnosis not present

## 2014-07-09 NOTE — Patient Instructions (Signed)
Diabetes and Foot Care Diabetes may cause you to have problems because of poor blood supply (circulation) to your feet and legs. This may cause the skin on your feet to become thinner, break easier, and heal more slowly. Your skin may become dry, and the skin may peel and crack. You may also have nerve damage in your legs and feet causing decreased feeling in them. You may not notice minor injuries to your feet that could lead to infections or more serious problems. Taking care of your feet is one of the most important things you can do for yourself.  HOME CARE INSTRUCTIONS  Wear shoes at all times, even in the house. Do not go barefoot. Bare feet are easily injured.  Check your feet daily for blisters, cuts, and redness. If you cannot see the bottom of your feet, use a mirror or ask someone for help.  Wash your feet with warm water (do not use hot water) and mild soap. Then pat your feet and the areas between your toes until they are completely dry. Do not soak your feet as this can dry your skin.  Apply a moisturizing lotion or petroleum jelly (that does not contain alcohol and is unscented) to the skin on your feet and to dry, brittle toenails. Do not apply lotion between your toes.  Trim your toenails straight across. Do not dig under them or around the cuticle. File the edges of your nails with an emery board or nail file.  Do not cut corns or calluses or try to remove them with medicine.  Wear clean socks or stockings every day. Make sure they are not too tight. Do not wear knee-high stockings since they may decrease blood flow to your legs.  Wear shoes that fit properly and have enough cushioning. To break in new shoes, wear them for just a few hours a day. This prevents you from injuring your feet. Always look in your shoes before you put them on to be sure there are no objects inside.  Do not cross your legs. This may decrease the blood flow to your feet.  If you find a minor scrape,  cut, or break in the skin on your feet, keep it and the skin around it clean and dry. These areas may be cleansed with mild soap and water. Do not cleanse the area with peroxide, alcohol, or iodine.  When you remove an adhesive bandage, be sure not to damage the skin around it.  If you have a wound, look at it several times a day to make sure it is healing.  Do not use heating pads or hot water bottles. They may burn your skin. If you have lost feeling in your feet or legs, you may not know it is happening until it is too late.  Make sure your health care provider performs a complete foot exam at least annually or more often if you have foot problems. Report any cuts, sores, or bruises to your health care provider immediately. SEEK MEDICAL CARE IF:   You have an injury that is not healing.  You have cuts or breaks in the skin.  You have an ingrown nail.  You notice redness on your legs or feet.  You feel burning or tingling in your legs or feet.  You have pain or cramps in your legs and feet.  Your legs or feet are numb.  Your feet always feel cold. SEEK IMMEDIATE MEDICAL CARE IF:   There is increasing redness,   swelling, or pain in or around a wound.  There is a red line that goes up your leg.  Pus is coming from a wound.  You develop a fever or as directed by your health care provider.  You notice a bad smell coming from an ulcer or wound. Document Released: 07/09/2000 Document Revised: 03/14/2013 Document Reviewed: 12/19/2012 [employer] Patient Information 2015 [employer]. This information is not intended to replace advice given to you by your health care provider. Make sure you discuss any questions you have with your health care provider.  

## 2014-07-09 NOTE — Progress Notes (Signed)
   Subjective:    Patient ID: XIONG HAIDAR, male    DOB: March 12, 1946, 68 y.o.   MRN: [id number]  HPI Comments: Pt presents with his caregiver from [facility].      Review of Systems no new findings or systemic changes noted     Objective:   Physical Exam Vascular status is intact pulses palpable DP +2 PT 1 over 4 Refill time 3 seconds all digits epicritic sensations diminished the forefoot and arch on Semmes Weinstein testing. 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

## 2014-07-31 DIAGNOSIS — F209 Schizophrenia, unspecified: Secondary | ICD-10-CM | POA: Diagnosis not present

## 2014-07-31 DIAGNOSIS — N139 Obstructive and reflux uropathy, unspecified: Secondary | ICD-10-CM | POA: Diagnosis not present

## 2014-07-31 DIAGNOSIS — F70 Mild intellectual disabilities: Secondary | ICD-10-CM | POA: Diagnosis not present

## 2014-08-07 ENCOUNTER — Encounter: Payer: Self-pay | Admitting: Family

## 2014-08-07 ENCOUNTER — Other Ambulatory Visit (INDEPENDENT_AMBULATORY_CARE_PROVIDER_SITE_OTHER): Payer: Medicare Other

## 2014-08-07 DIAGNOSIS — D509 Iron deficiency anemia, unspecified: Secondary | ICD-10-CM

## 2014-08-07 LAB — CBC WITH DIFFERENTIAL/PLATELET
Basophils Absolute: 0.1 10*3/uL (ref 0.0–0.1)
Basophils Relative: 0.6 % (ref 0.0–3.0)
Eosinophils Absolute: 1.3 10*3/uL — ABNORMAL HIGH (ref 0.0–0.7)
Eosinophils Relative: 13.9 % — ABNORMAL HIGH (ref 0.0–5.0)
HCT: 39 % (ref 39.0–52.0)
Hemoglobin: 12.9 g/dL — ABNORMAL LOW (ref 13.0–17.0)
Lymphocytes Relative: 15.8 % (ref 12.0–46.0)
Lymphs Abs: 1.5 10*3/uL (ref 0.7–4.0)
MCHC: 33.1 g/dL (ref 30.0–36.0)
MCV: 96.4 fl (ref 78.0–100.0)
Monocytes Absolute: 0.6 10*3/uL (ref 0.1–1.0)
Monocytes Relative: 6.6 % (ref 3.0–12.0)
Neutro Abs: 5.8 10*3/uL (ref 1.4–7.7)
Neutrophils Relative %: 63.1 % (ref 43.0–77.0)
Platelets: 284 10*3/uL (ref 150.0–400.0)
RBC: 4.05 Mil/uL — ABNORMAL LOW (ref 4.22–5.81)
RDW: 14.2 % (ref 11.5–15.5)
WBC: 9.3 10*3/uL (ref 4.0–10.5)

## 2014-08-07 LAB — IRON: Iron: 70 ug/dL (ref 42–165)

## 2014-08-19 ENCOUNTER — Telehealth: Payer: Self-pay | Admitting: *Deleted

## 2014-08-19 ENCOUNTER — Ambulatory Visit: Payer: Medicare Other | Admitting: Family

## 2014-08-19 NOTE — Telephone Encounter (Signed)
Pt did not show for appointment 08/19/14 at 9:30am for 2 month follow up. Rescheduled for 08/21/13.  Pt will not be charged no show fee today due to weather.

## 2014-08-21 ENCOUNTER — Encounter: Payer: Self-pay | Admitting: Family

## 2014-08-21 ENCOUNTER — Ambulatory Visit (INDEPENDENT_AMBULATORY_CARE_PROVIDER_SITE_OTHER): Payer: Medicare Other | Admitting: Family

## 2014-08-21 VITALS — BP 102/61 | HR 85 | Temp 97.5°F | Resp 16 | Ht 68.5 in | Wt 166.2 lb

## 2014-08-21 DIAGNOSIS — L309 Dermatitis, unspecified: Secondary | ICD-10-CM

## 2014-08-21 DIAGNOSIS — E039 Hypothyroidism, unspecified: Secondary | ICD-10-CM | POA: Diagnosis not present

## 2014-08-21 DIAGNOSIS — L89151 Pressure ulcer of sacral region, stage 1: Secondary | ICD-10-CM

## 2014-08-21 DIAGNOSIS — E119 Type 2 diabetes mellitus without complications: Secondary | ICD-10-CM | POA: Diagnosis not present

## 2014-08-21 DIAGNOSIS — L98429 Non-pressure chronic ulcer of back with unspecified severity: Secondary | ICD-10-CM

## 2014-08-21 DIAGNOSIS — D509 Iron deficiency anemia, unspecified: Secondary | ICD-10-CM

## 2014-08-21 DIAGNOSIS — I10 Essential (primary) hypertension: Secondary | ICD-10-CM

## 2014-08-21 LAB — HEMOGLOBIN A1C: HEMOGLOBIN A1C: 5.4 % (ref 4.6–6.5)

## 2014-08-21 LAB — BASIC METABOLIC PANEL
BUN: 25 mg/dL — ABNORMAL HIGH (ref 6–23)
CO2: 31 mEq/L (ref 19–32)
Calcium: 8.9 mg/dL (ref 8.4–10.5)
Chloride: 103 mEq/L (ref 96–112)
Creatinine, Ser: 1.38 mg/dL (ref 0.40–1.50)
GFR: 53.99 mL/min — ABNORMAL LOW (ref >60.00)
Glucose, Bld: 141 mg/dL — ABNORMAL HIGH (ref 70–99)
POTASSIUM: 4.2 meq/L (ref 3.5–5.1)
SODIUM: 139 meq/L (ref 135–145)

## 2014-08-21 NOTE — Assessment & Plan Note (Signed)
Clinically stable on current dose of levothyroxine. Continue.

## 2014-08-21 NOTE — Assessment & Plan Note (Signed)
Stable with iron. Continue.

## 2014-08-21 NOTE — Patient Instructions (Signed)
Please complete lab work prior to leaving. Apply hydrocortisone cream to forearms twice daily as needed. Follow up in 3 months.

## 2014-08-21 NOTE — Progress Notes (Signed)
Subjective:    Patient ID: Edward [name], male    DOB: 10-04-1945, 69 y.o.   MRN: [id number]  HPI Edward [name] is here today for follow up. Information given by caregiver who accompanies him.: Edward [name] who is with him all day three days per week.  1. DM: Fasting BS: runs about 120-140 2 hour Post-prandial BS: runs about 180-190. Diet: Reports that they give candy bars at daycare program. At home he eats low sugar and low white carbs. Exercise: none Eye exam:  caregiver thinks he has eye exam coming up. Diet controlled. Denies episodes of hypoglycemia. Lab Results  Component Value Date   HGBA1C 5.5 04/02/2014   2. HYPERTENSION: The patient denies the following associated symptoms: Chest pain, dyspnea, blurred vision, headache, or lower extremity edema. BP Readings from Last 3 Encounters:  08/21/14 102/61  06/17/14 100/54  06/06/14 104/62    3. Hypothyroidism: Reports positive compliance with levothyroxine.. The patient denies any of the following symptoms: Fatigue, cold intolerance, constipation.  Lab Results  Component Value Date   TSH 2.90 04/02/2014   4. Anemia: compliant with iron.  Lab Results  Component Value Date   WBC 9.3 08/07/2014   HGB 12.9* 08/07/2014   HCT 39.0 08/07/2014   MCV 96.4 08/07/2014   PLT 284.0 08/07/2014   5. Pruritus: Reports pruritris which causes pt to scratch his arms and cause excoriated areas.  6. Skin ulcer sacral region: Caregiver reports lesion is healed.  Review of Systems  Constitutional: Negative for fever, chills and fatigue.  Gastrointestinal: Negative for constipation and blood in stool.       Past Medical History  Diagnosis Date  . Diabetes mellitus   . Urinary incontinence   . Mental retardation   . Schizophrenia   . BPH (benign prostatic hyperplasia)   . Thyroid disease   . Hypertension     History   Social History  . Marital Status: Single    Spouse Name: N/A    Number of Children: N/A  . Years  of Education: N/A   Occupational History  . Not on file.   Social History Main Topics  . Smoking status: Never Smoker   . Smokeless tobacco: Current User     Comment: chews tobacco  . Alcohol Use: No  . Drug Use: No  . Sexual Activity: Not on file   Other Topics Concern  . Not on file   Social History Narrative   Lives in a group home   Sister [name] is his legal guardian.   Sister and her family live locally    Past Surgical History  Procedure Laterality Date  . Prostate surgery      x2, ?procedure    Family History  Problem Relation Age of Onset  . Arthritis Mother   . Arrhythmia Mother   . Lymphoma Mother     non-hodgkins  . Heart disease Father   . Stroke Father      BP 102/61 mmHg  Pulse 85  Temp(Src) 97.5 F (36.4 C) (Oral)  Resp 16  Ht 5' 8.5" (1.74 m)  Wt 166 lb 4 oz (75.411 kg)  BMI 24.91 kg/m2  SpO2 100%    Objective:   Physical Exam  Constitutional: He appears well-nourished. No distress.  HENT:  Head: Normocephalic and atraumatic.  Eyes: Pupils are equal, round, and reactive to light.  Cardiovascular: Normal rate and regular rhythm.  Exam reveals no gallop and no friction rub.   No murmur heard.  Pulmonary/Chest: Effort normal and breath sounds normal. No respiratory distress. He has no wheezes. He has no rales.  Lymphadenopathy:    He has no cervical adenopathy.  Neurological: He is alert.  Skin: Skin is warm and dry. He is not diaphoretic.  Multiple scabbed over areas noted on arms. Caregiver reports that scratches his skin.  Psychiatric: He has a normal mood and affect.          Assessment & Plan:  Patient seen along with Dawn Whitmire NP-student.  I have personally seen and examined patient and agree with Ms. Whitmire's assessment and plan- Debbrah Alar NP

## 2014-08-21 NOTE — Assessment & Plan Note (Addendum)
Post-prandial BS elevated. Check A1C, BMET today

## 2014-08-21 NOTE — Assessment & Plan Note (Addendum)
Pt has pruritus that causes him to scratch and cause skin abrasions.  Instructed caregiver to apply hydrocortisone cream to forearms twice daily as needed.

## 2014-08-21 NOTE — Progress Notes (Signed)
Pre visit review using our clinic review tool, if applicable. No additional management support is needed unless otherwise documented below in the visit note/SLS  

## 2014-08-21 NOTE — Assessment & Plan Note (Signed)
Caregiver reports lesion is healed.

## 2014-08-21 NOTE — Assessment & Plan Note (Signed)
BP stable without medication.

## 2014-08-22 ENCOUNTER — Encounter: Payer: Self-pay | Admitting: Family

## 2014-09-10 ENCOUNTER — Encounter (HOSPITAL_BASED_OUTPATIENT_CLINIC_OR_DEPARTMENT_OTHER): Payer: Self-pay | Admitting: *Deleted

## 2014-09-10 ENCOUNTER — Ambulatory Visit: Payer: Self-pay | Admitting: Physician Assistant

## 2014-09-10 ENCOUNTER — Emergency Department (HOSPITAL_BASED_OUTPATIENT_CLINIC_OR_DEPARTMENT_OTHER)
Admission: EM | Admit: 2014-09-10 | Discharge: 2014-09-10 | Discharge status: Against Medical Advice | Payer: Medicare Other | Attending: Emergency Medicine | Admitting: Emergency Medicine

## 2014-09-10 DIAGNOSIS — I10 Essential (primary) hypertension: Secondary | ICD-10-CM | POA: Insufficient documentation

## 2014-09-10 DIAGNOSIS — F79 Unspecified intellectual disabilities: Secondary | ICD-10-CM | POA: Insufficient documentation

## 2014-09-10 DIAGNOSIS — E119 Type 2 diabetes mellitus without complications: Secondary | ICD-10-CM | POA: Insufficient documentation

## 2014-09-10 DIAGNOSIS — R3915 Urgency of urination: Secondary | ICD-10-CM | POA: Insufficient documentation

## 2014-09-10 NOTE — ED Notes (Signed)
Brought from a group home. Care giver states this am while at his program he was shouting "I feel like I am dying". He looked fine to the staff but they wanted him checked. On arrival to triage he is ambulatory. States he feels like he needs to pee but can't. Hx of urinary urgency. He sees a Dealer for same.

## 2014-09-11 ENCOUNTER — Ambulatory Visit (HOSPITAL_BASED_OUTPATIENT_CLINIC_OR_DEPARTMENT_OTHER)
Admission: RE | Admit: 2014-09-11 | Discharge: 2014-09-11 | Disposition: A | Discharge status: Home / Self Care | Payer: Medicare Other | Source: Ambulatory Visit | Attending: Family | Admitting: Family

## 2014-09-11 ENCOUNTER — Ambulatory Visit (INDEPENDENT_AMBULATORY_CARE_PROVIDER_SITE_OTHER): Payer: Medicare Other | Admitting: Family

## 2014-09-11 ENCOUNTER — Encounter: Payer: Self-pay | Admitting: Family

## 2014-09-11 ENCOUNTER — Other Ambulatory Visit: Payer: Self-pay | Admitting: Family

## 2014-09-11 VITALS — BP 98/62 | HR 88 | Temp 98.0°F | Resp 16 | Ht 68.5 in | Wt 164.8 lb

## 2014-09-11 DIAGNOSIS — N400 Enlarged prostate without lower urinary tract symptoms: Secondary | ICD-10-CM | POA: Diagnosis not present

## 2014-09-11 DIAGNOSIS — L89151 Pressure ulcer of sacral region, stage 1: Secondary | ICD-10-CM

## 2014-09-11 DIAGNOSIS — L98429 Non-pressure chronic ulcer of back with unspecified severity: Secondary | ICD-10-CM

## 2014-09-11 DIAGNOSIS — R634 Abnormal weight loss: Secondary | ICD-10-CM

## 2014-09-11 DIAGNOSIS — R195 Other fecal abnormalities: Secondary | ICD-10-CM | POA: Diagnosis not present

## 2014-09-11 DIAGNOSIS — K5900 Constipation, unspecified: Secondary | ICD-10-CM | POA: Insufficient documentation

## 2014-09-11 DIAGNOSIS — K921 Melena: Secondary | ICD-10-CM | POA: Diagnosis not present

## 2014-09-11 DIAGNOSIS — R339 Retention of urine, unspecified: Secondary | ICD-10-CM | POA: Diagnosis not present

## 2014-09-11 MED ORDER — NYSTATIN 100000 UNIT/GM EX CREA: 1.0000 "application " | TOPICAL_CREAM | Freq: Two times a day (BID) | CUTANEOUS | Status: DC

## 2014-09-11 NOTE — Assessment & Plan Note (Signed)
He continues to have weight loss.  Now heme + stool as well. He has had some mild anemia.  Will repeat CT abd/pelvis and refer back to GI for evaluation of heme + stool.  Attempted to order CT chest as well as part of malignancy work up but medicare would not cover CT chest for diagnosis of weight loss.  Obtain CXR instead.

## 2014-09-11 NOTE — Assessment & Plan Note (Signed)
Appears to have superinfection of yeast- rx with antifungal cream.

## 2014-09-11 NOTE — Assessment & Plan Note (Signed)
Soft stool in vault. Will obtain KUB today to evaluate for possible retained stool.  Consider miralax if significant constipation.

## 2014-09-11 NOTE — Patient Instructions (Addendum)
Please contact Mr. Coe psychiatrist to confirm proper dosing of mellaril.  Complete x ray on the first floor.  You will be contacted about your referral to GI. Apply cream to buttocks/sacral area twice daily. Follow up in 6 week.

## 2014-09-11 NOTE — Assessment & Plan Note (Signed)
He has apt with urology today.  I suspect that he also has some urinary retention, they will catheterize him later this AM.

## 2014-09-11 NOTE — Telephone Encounter (Signed)
I reviewed his abdominal x ray and it does show constipation. I advise miralax one cap full in 8 oz of water or juice x 1.  If no BM, repeat tomorrow.

## 2014-09-11 NOTE — Telephone Encounter (Signed)
Could you please schedule a follow up visit with [facility]- pt is heme positive today

## 2014-09-11 NOTE — Progress Notes (Signed)
Subjective:    Patient ID: Edward [name], male    DOB: [birth year], 69 y.o.   MRN: [id number]  HPI  Patient here with report of constipation.  Feels like he needs to have a BM but can't.    Yesterday he was brought to the ED by the staff for same..  Sat in the ED for 1 hr and then went home because he seemed to be doing better.  Reports that he is unable to move his bowels.    Review of Systems    see HPI   Past Medical History  Diagnosis Date  . Diabetes mellitus   . Urinary incontinence   . Mental retardation   . Schizophrenia   . BPH (benign prostatic hyperplasia)   . Thyroid disease   . Hypertension     History   Social History  . Marital Status: Single    Spouse Name: N/A  . Number of Children: N/A  . Years of Education: N/A   Occupational History  . Not on file.   Social History Main Topics  . Smoking status: Never Smoker   . Smokeless tobacco: Current User     Comment: chews tobacco  . Alcohol Use: No  . Drug Use: No  . Sexual Activity: Not on file   Other Topics Concern  . Not on file   Social History Narrative   Lives in a group home   Sister [name] is his legal guardian.   Sister and her family live locally    Past Surgical History  Procedure Laterality Date  . Prostate surgery      x2, ?procedure    Family History  Problem Relation Age of Onset  . Arthritis Mother   . Arrhythmia Mother   . Lymphoma Mother     non-hodgkins  . Heart disease Father   . Stroke Father     No Known Allergies   BP 98/62 mmHg  Pulse 88  Temp(Src) 98 F (36.7 C) (Oral)  Resp 16  Ht 5' 8.5" (1.74 m)  Wt 164 lb 12.8 oz (74.753 kg)  BMI 24.69 kg/m2  SpO2 99%     Objective:    Physical Exam  Constitutional: He appears well-developed and well-nourished. No distress.  HENT:  Head: Normocephalic and atraumatic.  Cardiovascular: Normal rate and regular rhythm.   No murmur heard. Pulmonary/Chest: Effort normal and breath sounds normal. No  respiratory distress. He has no wheezes. He has no rales.  Abdominal: Soft. He exhibits distension. He exhibits no mass. There is no tenderness.  Genitourinary:  Normal rectal exam, soft stool in rectal vault- stool is heme +  Musculoskeletal: He exhibits no edema.  Neurological: He is alert.  Skin: Skin is warm and dry.  Stage 1 sacral ulcer with erythema    BP 98/62 mmHg  Pulse 88  Temp(Src) 98 F (36.7 C) (Oral)  Resp 16  Ht 5' 8.5" (1.74 m)  Wt 164 lb 12.8 oz (74.753 kg)  BMI 24.69 kg/m2  SpO2 99% Wt Readings from Last 3 Encounters:  09/11/14 164 lb 12.8 oz (74.753 kg)  08/21/14 166 lb 4 oz (75.411 kg)  06/17/14 178 lb (80.74 kg)     Lab Results  Component Value Date   WBC 9.3 08/07/2014   HGB 12.9* 08/07/2014   HCT 39.0 08/07/2014   PLT 284.0 08/07/2014   GLUCOSE 141* 08/21/2014   CHOL 137 08/24/2012   TRIG 150* 08/24/2012   HDL 31* 08/24/2012   LDLCALC  76 08/24/2012   ALT 27 05/19/2012   AST 25 05/19/2012   NA 139 08/21/2014   K 4.2 08/21/2014   CL 103 08/21/2014   CREATININE 1.38 08/21/2014   BUN 25* 08/21/2014   CO2 31 08/21/2014   TSH 2.90 04/02/2014   HGBA1C 5.4 08/21/2014        Assessment & Plan:   Problem List Items Addressed This Visit    None       O'SULLIVAN,Taavi Hoose S., NP

## 2014-09-11 NOTE — Telephone Encounter (Signed)
Scheduled follow up with Nicoletta Ba on 09/17/14 at 9:30am (first available).  Notified pt's sister and caregiver.

## 2014-09-13 ENCOUNTER — Ambulatory Visit (HOSPITAL_BASED_OUTPATIENT_CLINIC_OR_DEPARTMENT_OTHER): Payer: Medicare Other

## 2014-09-13 ENCOUNTER — Ambulatory Visit (HOSPITAL_BASED_OUTPATIENT_CLINIC_OR_DEPARTMENT_OTHER)
Admission: RE | Admit: 2014-09-13 | Discharge: 2014-09-13 | Disposition: A | Discharge status: Home / Self Care | Payer: Medicare Other | Source: Ambulatory Visit | Attending: Family | Admitting: Family

## 2014-09-13 DIAGNOSIS — K921 Melena: Secondary | ICD-10-CM | POA: Insufficient documentation

## 2014-09-13 DIAGNOSIS — R634 Abnormal weight loss: Secondary | ICD-10-CM | POA: Insufficient documentation

## 2014-09-13 DIAGNOSIS — N281 Cyst of kidney, acquired: Secondary | ICD-10-CM | POA: Diagnosis not present

## 2014-09-13 DIAGNOSIS — K5641 Fecal impaction: Secondary | ICD-10-CM | POA: Diagnosis not present

## 2014-09-13 MED ORDER — IOHEXOL 300 MG/ML  SOLN
100.0000 mL | Freq: Once | INTRAMUSCULAR | Status: AC | PRN
  Administered 2014-09-13: 100 mL via INTRAVENOUS

## 2014-09-13 MED ORDER — POLYETHYLENE GLYCOL 3350 17 GM/SCOOP PO POWD: ORAL | Status: DC

## 2014-09-13 NOTE — Telephone Encounter (Addendum)
Notified caregiver and she requests that we fax Rx to [name] at [phone].  I have pended Rx.  Please advise if ok to send.

## 2014-09-13 NOTE — Telephone Encounter (Signed)
rx sent

## 2014-09-14 ENCOUNTER — Telehealth: Payer: Self-pay | Admitting: Family

## 2014-09-14 NOTE — Telephone Encounter (Signed)
Please call pt's sister and let her know that his CT looks good. Just notes constipation.  Note is also made of possible inguinal hernias?  If they wish to pursue further work up of this he would need a scrotal ultrasound, I will leave it up to his sister if she wishes to pursue this.

## 2014-09-14 NOTE — Telephone Encounter (Signed)
LVM for [name] to call back regarding notes below.

## 2014-09-16 ENCOUNTER — Telehealth: Payer: Self-pay | Admitting: Family

## 2014-09-16 ENCOUNTER — Other Ambulatory Visit: Payer: Medicare Other

## 2014-09-16 ENCOUNTER — Encounter: Payer: Self-pay | Admitting: Family

## 2014-09-16 ENCOUNTER — Other Ambulatory Visit (INDEPENDENT_AMBULATORY_CARE_PROVIDER_SITE_OTHER): Payer: Medicare Other

## 2014-09-16 DIAGNOSIS — R195 Other fecal abnormalities: Secondary | ICD-10-CM

## 2014-09-16 LAB — CBC WITH DIFFERENTIAL/PLATELET
Basophils Absolute: 0 10*3/uL (ref 0.0–0.1)
Basophils Relative: 0.5 % (ref 0.0–3.0)
Eosinophils Absolute: 0.8 10*3/uL — ABNORMAL HIGH (ref 0.0–0.7)
Eosinophils Relative: 9.1 % — ABNORMAL HIGH (ref 0.0–5.0)
HCT: 37 % — ABNORMAL LOW (ref 39.0–52.0)
Hemoglobin: 12.6 g/dL — ABNORMAL LOW (ref 13.0–17.0)
LYMPHS PCT: 17 % (ref 12.0–46.0)
Lymphs Abs: 1.4 10*3/uL (ref 0.7–4.0)
MCHC: 33.9 g/dL (ref 30.0–36.0)
MCV: 95 fl (ref 78.0–100.0)
MONOS PCT: 5.8 % (ref 3.0–12.0)
Monocytes Absolute: 0.5 10*3/uL (ref 0.1–1.0)
Neutro Abs: 5.7 10*3/uL (ref 1.4–7.7)
Neutrophils Relative %: 67.6 % (ref 43.0–77.0)
PLATELETS: 261 10*3/uL (ref 150.0–400.0)
RBC: 3.89 Mil/uL — ABNORMAL LOW (ref 4.22–5.81)
RDW: 14 % (ref 11.5–15.5)
WBC: 8.4 10*3/uL (ref 4.0–10.5)

## 2014-09-16 NOTE — Telephone Encounter (Signed)
° °  Please call after 4:30 Olexa,[name] she will be in a meeting. Best # [phone]

## 2014-09-16 NOTE — Telephone Encounter (Signed)
Left detailed message on sister's voicemail and to call and let us know how they want to proceed.

## 2014-09-16 NOTE — Telephone Encounter (Signed)
Spoke with caregiver, [name] and she will have staff bring pt to the lab today to complete CBC. Order already in [place].

## 2014-09-16 NOTE — Telephone Encounter (Signed)
Please ask pt to return to lab for CBC.

## 2014-09-16 NOTE — Telephone Encounter (Signed)
Also notified caregiver, [name]. Awaiting to hear back from pt's sister.

## 2014-09-17 ENCOUNTER — Encounter: Payer: Self-pay | Admitting: Physician Assistant

## 2014-09-17 ENCOUNTER — Ambulatory Visit (INDEPENDENT_AMBULATORY_CARE_PROVIDER_SITE_OTHER): Payer: Medicare Other | Admitting: Physician Assistant

## 2014-09-17 ENCOUNTER — Telehealth: Payer: Self-pay | Admitting: *Deleted

## 2014-09-17 ENCOUNTER — Other Ambulatory Visit: Payer: Self-pay | Admitting: *Deleted

## 2014-09-17 VITALS — BP 100/62 | HR 92 | Ht 68.5 in | Wt 167.0 lb

## 2014-09-17 DIAGNOSIS — K5909 Other constipation: Secondary | ICD-10-CM | POA: Diagnosis not present

## 2014-09-17 DIAGNOSIS — R195 Other fecal abnormalities: Secondary | ICD-10-CM

## 2014-09-17 MED ORDER — MOVIPREP 100 G PO SOLR: 1.0000 | ORAL | Status: DC

## 2014-09-17 NOTE — Progress Notes (Signed)
Patient ID: Edward [name], male   DOB: 05/20/1946, 69 y.o.   MRN: [id number]   Subjective:    Patient ID: Edward [name], male    DOB: Jan 19, 1946, 69 y.o.   MRN: [id number]  HPI Edward [name] is a pleasant 69 year old mentally retarded white male with history of schizophrenia. He has history of hypertension, adult-onset diabetes mellitus, hyperlipidemia, hypothyroidism, prior history of iron deficiency anemia. He lives in a group home and his sister who is his guardian accompanies him today. He was seen in our office in September 2015 at that time with concerns about a weight loss of about 15 pounds. Patient was completely asymptomatic. It was felt this may have been medication related as he had been on Topamax. This is since been stopped however he has continued to lose some weight. We discussed CT of the abdomen and pelvis with his sister but she would did not feel that he would cooperate, likewise discussed colon neoplasia surveillance with colonoscopy but she did not want to put him through that and did not feel that he would cooperate with the prep. He comes back today after an ER visit on 09/10/2014 with complaints of rectal pressure and constipation. Apparently they sat  in the emergency room but he was not actually seen as the wait was too long. He was then seen by primary care, Debbrah Alar and underwent CT scan of the abdomen and pelvis on 09/13/2014 with finding of a right renal cyst 5.3 cm, and moderate fecal retention throughout the colon especially in the rectum. He was noted to be Hemoccult positive on rectal exam. He has been started on MiraLAX 17 g in 8 ounces of water daily and according to the aide  from the group home he has been having regular large bowel movements and seems to be feeling better. Patient has no complaints of abdominal discomfort he says he feels better. Neither the sister or the tech from the group home are aware of any rectal bleeding. Labs done yesterday showed WBC  of 8.4 hemoglobin 12.6 hematocrit of 37 MCV of 95 these are stable.  Review of Systems  Pertinent positive and negative review of systems were noted in the above HPI section.  All other review of systems was otherwise negative.   No Known Allergies Patient Active Problem List   Diagnosis Date Noted  . Constipation 09/11/2014  . Orthostatic hypotension 06/06/2014  . Skin laceration 06/06/2014  . Stage 1 skin ulcer of sacral region 05/06/2014  . Anemia, iron deficiency 05/05/2014  . Pica in adults 04/23/2014  . Recurrent falls 04/02/2014  . Routine general medical examination at a health care facility 03/23/2013  . Hypothyroidism 01/05/2013  . Kidney mass 11/24/2012  . Insomnia 10/13/2012  . Loss of weight 10/13/2012  . Eczema 04/10/2012  . Schizophrenia 04/10/2012  . BPH (benign prostatic hyperplasia) 04/10/2012  . HTN (hypertension) 04/10/2012  . Diabetes mellitus without complication 62/83/6629  . Hyperlipidemia 04/10/2012   History   Social History  . Marital Status: Single    Spouse Name: N/A  . Number of Children: N/A  . Years of Education: N/A   Occupational History  . Not on file.   Social History Main Topics  . Smoking status: Never Smoker   . Smokeless tobacco: Current User    Types: Snuff     Comment: chews tobacco  . Alcohol Use: No  . Drug Use: No  . Sexual Activity: Not on file   Other Topics Concern  .  Not on file   Social History Narrative   Lives in a group home   Sister [name] is his legal guardian.   Sister and her family live locally    Mr. Brotherton family history includes Arrhythmia in his mother; Arthritis in his mother; Heart disease in his father; Lymphoma in his mother; Stroke in his father.  Mr. Lavallee does not currently have medications on file.     Objective:    Filed Vitals:   09/17/14 0941  BP: 100/62  Pulse: 92    Physical Exam  well-developed older white male in no acute distress, cooperative accompanied by his  sister and another caregiver blood pressure 100/62 pulse 92 height 5 foot 8 weight 167. HEENT; nontraumatic normocephalic EOMI PERRLA sclera anicteric, Supple; no JVD, Cardiovascular; regular rate and rhythm with S1-S2 no murmur or gallop, Pulmonary ;clear bilaterally, Abdomen;soft nontender nondistended bowel sounds are active there is no palpable mass or hepatosplenomegaly, Rectal ;exam not done this was done 09/11/2014 per PCP soft stool in rectal vault heme positive, Extremities; no clubbing cyanosis or edema skin warm and dry, Psych ;patient is mentally retarded, cooperative and answers appropriately with short responses.     Assessment & Plan:   #1 69 yo male , mentally retarded with history of schizophrenia with chronic constipation, Hemoccult-positive stool and weight loss. 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 Need to rule out occult colon lesion #2 adult-onset diabetes mellitus #3 hypertension #4 BPH #5 hypothyroidism #6 history of iron deficiency anemia  Plan; Continue MiraLAX 17 g in 8 ounces of water every day Schedule for colonoscopy with Dr. Ardis Hughs. After discussion with the patient's sister and group home workers they both feel that he cooperated very well with CT contrast and feel that he probably could do a bowel prep without much difficulty. Further plans pending results of colonoscopy.   Amy Genia Harold PA-C 09/17/2014

## 2014-09-17 NOTE — Patient Instructions (Signed)
Continue the Miralax 17 grams in 8 0z of water every day..  You have been scheduled for a colonoscopy. Please follow written instructions given to you at your visit today.  Please pick up your prep kit at the pharmacy within the next 1-3 days. If you use inhalers (even only as needed), please bring them with you on the day of your procedure. Your physician has requested that you go to www.startemmi.com and enter the access code given to you at your visit today. This web site gives a general overview about your procedure. However, you should still follow specific instructions given to you by our office regarding your preparation for the procedure.

## 2014-09-17 NOTE — Telephone Encounter (Signed)
I spoke to the patient's legal guardian, his sister. I advised her we got a fax from the pharmacy that asked [place] to do a Prior Authorization for the Moviprep.  I called [facility] in [place] and told them to replace that with the Generic miralax for the split dose miralax prep.  I then called the sister back and she said she wants the Moviprep and she will pay for it.  She wants the best prep for him due to the problems we had the last time with his prep.  I called [facility] back and they will fill the prescription with the Moviprep. Woodroe Chen, patient's sister will call [facility] about payment.

## 2014-09-17 NOTE — Progress Notes (Signed)
I am in [place] today, not sure that I am the supervising MD for this patient.

## 2014-09-17 NOTE — Telephone Encounter (Signed)
Spoke to pharmacist and advised we do not do prior authorization for the Moviprep. Patient's primary was Medicare, secondary Medicaid so he couldn't get the prep for the Medicaid price of $6.00.  I told the pharmacist we changed to Generic Miralax 255 grams, take as directed.  Calling the patient's sister to let her know and I will mail the instructions to the group home attention to his caregiver.

## 2014-09-18 NOTE — Telephone Encounter (Signed)
Spoke with pt's sister. She states pt saw GI and has been scheduled for colonoscopy on 09/30/14 (thinks he may have internal hemorrhoids). [name] wants to know if you think it is necessary to pursue evaluation of possible hernias? Could this be related to any of his pain? What happens if they don't pursue it?

## 2014-09-18 NOTE — Telephone Encounter (Signed)
I don't think hernias are cause for his pain.  I think constipation is cause.  Hernias can sometimes worsen over time, sometimes they do not.  In his case, I would not recommend repair unless he has symptoms such as severe nausea/vomitting/abdominal pain which can occur if hernia becomes strangulated. We can keep an eye on this.

## 2014-09-19 NOTE — Telephone Encounter (Signed)
Left message for pt to return my call.

## 2014-09-19 NOTE — Telephone Encounter (Signed)
Notified pt's sister and she states they will monitor pt's symptoms for now and will let us know if symptoms present that would need further action re: his possible hernias.

## 2014-09-20 ENCOUNTER — Other Ambulatory Visit: Payer: Self-pay | Admitting: Family

## 2014-09-23 ENCOUNTER — Telehealth: Payer: Self-pay | Admitting: Physician Assistant

## 2014-09-26 NOTE — Telephone Encounter (Signed)
I called the patient's sister Edward [name] at [phone] and she just wanted me to know they had to change the date of the colonoscopy with Dr. Ardis Hughs to 10-02-2014 at 10:30 am.  The reason they changed the appointment was because the original date of 09-30-2014, the person helping Edward [name] with his prep would be a caregiver that the patient doesn't get along with.  His sister said the new date of 10-02-2014 will work out much better with the caregiver that will be helping Edward [name] ( the patient), he will do much better with. We went over the instructions and she verbalized understanding . She said she will go over the changes with the caregiver for her brother

## 2014-09-30 ENCOUNTER — Encounter: Payer: Self-pay | Admitting: Gastroenterology

## 2014-10-02 ENCOUNTER — Encounter: Payer: Self-pay | Admitting: Gastroenterology

## 2014-10-02 ENCOUNTER — Ambulatory Visit (AMBULATORY_SURGERY_CENTER): Payer: Medicare Other | Admitting: Gastroenterology

## 2014-10-02 VITALS — BP 107/84 | HR 77 | Temp 95.8°F | Resp 14 | Ht 68.5 in | Wt 167.0 lb

## 2014-10-02 DIAGNOSIS — Z1211 Encounter for screening for malignant neoplasm of colon: Secondary | ICD-10-CM

## 2014-10-02 DIAGNOSIS — K5909 Other constipation: Secondary | ICD-10-CM

## 2014-10-02 DIAGNOSIS — F79 Unspecified intellectual disabilities: Secondary | ICD-10-CM | POA: Diagnosis not present

## 2014-10-02 DIAGNOSIS — E119 Type 2 diabetes mellitus without complications: Secondary | ICD-10-CM | POA: Diagnosis not present

## 2014-10-02 DIAGNOSIS — Z5309 Procedure and treatment not carried out because of other contraindication: Secondary | ICD-10-CM | POA: Diagnosis not present

## 2014-10-02 DIAGNOSIS — R195 Other fecal abnormalities: Secondary | ICD-10-CM

## 2014-10-02 DIAGNOSIS — F209 Schizophrenia, unspecified: Secondary | ICD-10-CM | POA: Diagnosis not present

## 2014-10-02 DIAGNOSIS — K5900 Constipation, unspecified: Secondary | ICD-10-CM | POA: Diagnosis not present

## 2014-10-02 DIAGNOSIS — N400 Enlarged prostate without lower urinary tract symptoms: Secondary | ICD-10-CM | POA: Diagnosis not present

## 2014-10-02 MED ORDER — SODIUM CHLORIDE 0.9 % IV SOLN: 500.0000 mL | INTRAVENOUS | Status: DC

## 2014-10-02 NOTE — Op Note (Signed)
[facility]  [address]. [place], [address]   COLONOSCOPY PROCEDURE REPORT  PATIENT: Edward [name], Edward [name]  MR#: [id number] BIRTHDATE: 23-Oct-1945 , 69  yrs. old GENDER: male ENDOSCOPIST: Milus Banister, MD REFERRED BV:APOLIDC O'Sullivan, FNP PROCEDURE DATE:  10/02/2014 PROCEDURE:   Colonoscopy, diagnostic First Screening Colonoscopy - Avg.  risk and is 50 yrs.  old or older - No.  Prior Negative Screening - Now for repeat screening. N/A  History of Adenoma - Now for follow-up colonoscopy & has been > or = to 3 yrs.  N/A  Polyps Removed Today? No.  Recommend repeat exam, <10 yrs? No. ASA CLASS:   Class III INDICATIONS:FOBT positive stool. MEDICATIONS: Monitored anesthesia care and Propofol 100 mg IV  DESCRIPTION OF PROCEDURE:   After the risks benefits and alternatives of the procedure were thoroughly explained, informed consent was obtained.  The digital rectal exam revealed no abnormalities of the rectum.   The LB [id number]  endoscope was introduced through the anus and advanced to the splenic flexure. No adverse events experienced.   The quality of the prep was poor.  The instrument was then slowly withdrawn as the colon was fully examined.   COLON FINDINGS: The prep was poor, there was still solid and liquid stool remaining in the examined colon.  I passed the colonoscope to the splenic flexure but could get no further due to the poor prep. There were no obstructing lesions in the left colon, smaller lesions may have been missed.  Retroflexed views revealed no abnormalities. The time to cecum =    Withdrawal time =      The scope was withdrawn and the procedure completed. COMPLICATIONS: There were no immediate complications.  ENDOSCOPIC IMPRESSION: The prep was poor, there was still solid and liquid stool remaining in the examined colon.  I passed the colonoscope to the splenic flexure but could get no further due to the poor prep.  There were no  obstructing lesions in the left colon, smaller lesions may have been missed  RECOMMENDATIONS: Follow clinically.  Given overall situation would not try to repeat this examination with more extensive prep.  eSigned:  Milus Banister, MD 10/02/2014 10:26 AM

## 2014-10-02 NOTE — Progress Notes (Signed)
Report to PACU, RN, vss, BBS= Clear.  

## 2014-10-02 NOTE — Patient Instructions (Signed)
Call us with any questions or concerns. Thank you!  YOU HAD AN ENDOSCOPIC PROCEDURE TODAY AT [facility]:   Refer to the procedure report that was given to you for any specific questions about what was found during the examination.  If the procedure report does not answer your questions, please call your gastroenterologist to clarify.  If you requested that your care partner not be given the details of your procedure findings, then the procedure report has been included in a sealed envelope for you to review at your convenience later.  YOU SHOULD EXPECT: Some feelings of bloating in the abdomen. Passage of more gas than usual.  Walking can help get rid of the air that was put into your GI tract during the procedure and reduce the bloating. If you had a lower endoscopy (such as a colonoscopy or flexible sigmoidoscopy) you may notice spotting of blood in your stool or on the toilet paper. If you underwent a bowel prep for your procedure, you may not have a normal bowel movement for a few days.  Please Note:  You might notice some irritation and congestion in your nose or some drainage.  This is from the oxygen used during your procedure.  There is no need for concern and it should clear up in a day or so.  SYMPTOMS TO REPORT IMMEDIATELY:   Following lower endoscopy (colonoscopy or flexible sigmoidoscopy):  Excessive amounts of blood in the stool  Significant tenderness or worsening of abdominal pains  Swelling of the abdomen that is new, acute  Fever of 100F or higher   Following upper endoscopy (EGD)  Vomiting of blood or coffee ground material  New chest pain or pain under the shoulder blades  Painful or persistently difficult swallowing  New shortness of breath  Fever of 100F or higher  Black, tarry-looking stools  For urgent or emergent issues, a gastroenterologist can be reached at any hour by calling [phone].   DIET: Your first meal following the procedure  should be a small meal and then it is ok to progress to your normal diet. Heavy or fried foods are harder to digest and may make you feel nauseous or bloated.  Likewise, meals heavy in dairy and vegetables can increase bloating.  Drink plenty of fluids but you should avoid alcoholic beverages for 24 hours.  ACTIVITY:  You should plan to take it easy for the rest of today and you should NOT DRIVE or use heavy machinery until tomorrow (because of the sedation medicines used during the test).    FOLLOW UP: Our staff will call the number listed on your records the next business day following your procedure to check on you and address any questions or concerns that you may have regarding the information given to you following your procedure. If we do not reach you, we will leave a message.  However, if you are feeling well and you are not experiencing any problems, there is no need to return our call.  We will assume that you have returned to your regular daily activities without incident.  If any biopsies were taken you will be contacted by phone or by letter within the next 1-3 weeks.  Please call us at [phone] if you have not heard about the biopsies in 3 weeks.    SIGNATURES/CONFIDENTIALITY: You and/or your care partner have signed paperwork which will be entered into your electronic medical record.  These signatures attest to the fact that that the  information above on your After Visit Summary has been reviewed and is understood.  Full responsibility of the confidentiality of this discharge information lies with you and/or your care-partner.

## 2014-10-03 ENCOUNTER — Telehealth: Payer: Self-pay | Admitting: *Deleted

## 2014-10-03 NOTE — Telephone Encounter (Signed)
  Follow up Call-  Call back number 10/02/2014  Post procedure Call Back phone  # [phone], group home, speak w/care giver Jacqlyn Krauss  Permission to leave phone message Yes     Patient questions:  Do you have a fever, pain , or abdominal swelling? No. Pain Score  0 *  Have you tolerated food without any problems? Yes.    Have you been able to return to your normal activities? Yes.    Do you have any questions about your discharge instructions: Diet   No. Medications  No. Follow up visit  No.  Do you have questions or concerns about your Care? No.  Actions: * If pain score is 4 or above: No action needed, pain <4.

## 2014-10-04 ENCOUNTER — Encounter: Payer: Self-pay | Admitting: Family

## 2014-10-04 ENCOUNTER — Ambulatory Visit (INDEPENDENT_AMBULATORY_CARE_PROVIDER_SITE_OTHER): Payer: Medicare Other | Admitting: Family

## 2014-10-04 VITALS — BP 102/63 | HR 83 | Temp 98.3°F | Resp 16 | Ht 68.5 in | Wt 161.6 lb

## 2014-10-04 DIAGNOSIS — R634 Abnormal weight loss: Secondary | ICD-10-CM

## 2014-10-04 DIAGNOSIS — L89151 Pressure ulcer of sacral region, stage 1: Secondary | ICD-10-CM

## 2014-10-04 DIAGNOSIS — L98429 Non-pressure chronic ulcer of back with unspecified severity: Secondary | ICD-10-CM

## 2014-10-04 DIAGNOSIS — R195 Other fecal abnormalities: Secondary | ICD-10-CM

## 2014-10-04 NOTE — Progress Notes (Signed)
Subjective:    Patient ID: Edward [name], male    DOB: August 04, 1945, 69 y.o.   MRN: [id number]  HPI  Edward [name] is a 69 yr old male who presents today for follow up.  He is brought today by his caregiver- Edward [name].    Constipation/heme + stool- he was evaluated by GI and underwent colonoscopy.  Unfortunately, the prep was poor and colo was incomplete.  He did have a CT abd/pelvis and cxr which was unremarkable.  He has a reported good appetite, but has had some further weight loss.  No further colo is planned due to difficulty prep situation.  Lab Results  Component Value Date   TSH 2.90 04/02/2014   Sacral decub ulcer-  Caregiver reports that they continue to apply nystatin cream and change the pt frequently as he is incontinent.  He has a pad on his chair. They report that skin on his sacrum is unchanged.    He does have a bruise beneath his right eye- caregiver thinks that he fell in the middle of the night when he went looking for a snack the night before his colonoscopy.  Wt Readings from Last 3 Encounters:  10/04/14 161 lb 9.6 oz (73.301 kg)  10/02/14 167 lb (75.751 kg)  09/17/14 167 lb (75.751 kg)     Review of Systems See HPI  Past Medical History  Diagnosis Date  . Diabetes mellitus   . Urinary incontinence   . Mental retardation   . Schizophrenia   . BPH (benign prostatic hyperplasia)   . Thyroid disease   . Anemia   . Cataract   . Hypertension     DENIES    History   Social History  . Marital Status: Single    Spouse Name: N/A  . Number of Children: N/A  . Years of Education: N/A   Occupational History  . Not on file.   Social History Main Topics  . Smoking status: Never Smoker   . Smokeless tobacco: Current User    Types: Chew     Comment: chews tobacco  . Alcohol Use: No  . Drug Use: No  . Sexual Activity: Not on file   Other Topics Concern  . Not on file   Social History Narrative   Lives in a group home   Sister Edward [name] is his legal  guardian.   Sister and her family live locally    Past Surgical History  Procedure Laterality Date  . Prostate surgery      x2, ?procedure    Family History  Problem Relation Age of Onset  . Arthritis Mother   . Arrhythmia Mother   . Lymphoma Mother     non-hodgkins  . Kidney disease Mother   . Heart disease Father   . Stroke Father     No Known Allergies  Current Outpatient Prescriptions on File Prior to Visit  Medication Sig Dispense Refill  . Acetaminophen (ACETAMINOPHEN EXTRA STRENGTH) 167 MG/5ML LIQD Take 15 mLs by mouth.    Marland Kitchen aspirin 81 MG tablet Take 81 mg by mouth daily.    . clotrimazole (LOTRIMIN) 1 % cream Apply 1 application topically daily as needed.    . Control Gel Formula Dressing (DUODERM CGF DRESSING) MISC Apply 1 each topically every 3 (three) days. 20 each 0  . desonide (DESOWEN) 0.05 % lotion Apply 1 application topically 3 (three) times a week.    . diphenhydrAMINE (BENADRYL) 25 mg capsule Take 2 capsules (50 mg total)  by mouth at bedtime. 60 capsule 3  . dutasteride (AVODART) 0.5 MG capsule TAKE ONE CAPSULE BY MOUTH EVERY DAY 30 capsule 5  . Emollient (CERAVE) CREA Apply 1 application topically. For dry skin on feet and legs    . ferrous sulfate 325 (65 FE) MG tablet Take 1 tablet (325 mg total) by mouth 2 (two) times daily with a meal. 60 tablet 5  . FLUoxetine (PROZAC) 20 MG capsule Take 10 mg by mouth daily.     Marland Kitchen ketoconazole (NIZORAL) 2 % cream Apply 1 application topically 2 (two) times daily. For face    . levothyroxine (SYNTHROID, LEVOTHROID) 50 MCG tablet TAKE 1 TABLET BY MOUTH EVERY MORNING 30 tablet 5  . lip balm (BLISTEX) OINT Apply 1 application topically 4 (four) times daily as needed for lip care.    . Loperamide HCl (IMODIUM A-D PO) Take by mouth as needed.    . nystatin cream (MYCOSTATIN) Apply 1 application topically 2 (two) times daily. 30 g 3  . polyethylene glycol powder (GLYCOLAX/MIRALAX) powder Add 1 packet to 8 oz water or juice.   If no bowel movement today, may repeat dose tomorrow. 3350 g 1  . QUEtiapine (SEROQUEL) 50 MG tablet Take 50 mg by mouth 2 (two) times daily.    . Simethicone (MYLANTA GAS PO) Take by mouth as needed.    . tamsulosin (FLOMAX) 0.4 MG CAPS capsule TAKE ONE CAPSULE BY MOUTH AT BEDTIME 30 capsule 5  . thioridazine (MELLARIL) 100 MG tablet Take 200 mg by mouth at bedtime.     Marland Kitchen thioridazine (MELLARIL) 50 MG tablet Take 1 tablet (50 mg total) by mouth every morning. 30 tablet 10   No current facility-administered medications on file prior to visit.    BP 102/63 mmHg  Pulse 83  Temp(Src) 98.3 F (36.8 C) (Oral)  Resp 16  Ht 5' 8.5" (1.74 m)  Wt 161 lb 9.6 oz (73.301 kg)  BMI 24.21 kg/m2  SpO2 97%      see HPI  Past Medical History  Diagnosis Date  . Diabetes mellitus   . Urinary incontinence   . Mental retardation   . Schizophrenia   . BPH (benign prostatic hyperplasia)   . Thyroid disease   . Anemia   . Cataract   . Hypertension     DENIES    History   Social History  . Marital Status: Single    Spouse Name: N/A  . Number of Children: N/A  . Years of Education: N/A   Occupational History  . Not on file.   Social History Main Topics  . Smoking status: Never Smoker   . Smokeless tobacco: Current User    Types: Chew     Comment: chews tobacco  . Alcohol Use: No  . Drug Use: No  . Sexual Activity: Not on file   Other Topics Concern  . Not on file   Social History Narrative   Lives in a group home   Sister Edward [name] is his legal guardian.   Sister and her family live locally    Past Surgical History  Procedure Laterality Date  . Prostate surgery      x2, ?procedure    Family History  Problem Relation Age of Onset  . Arthritis Mother   . Arrhythmia Mother   . Lymphoma Mother     non-hodgkins  . Kidney disease Mother   . Heart disease Father   . Stroke Father     No Known  Allergies  Current Outpatient Prescriptions on File Prior to Visit    Medication Sig Dispense Refill  . Acetaminophen (ACETAMINOPHEN EXTRA STRENGTH) 167 MG/5ML LIQD Take 15 mLs by mouth.    Marland Kitchen aspirin 81 MG tablet Take 81 mg by mouth daily.    . clotrimazole (LOTRIMIN) 1 % cream Apply 1 application topically daily as needed.    . Control Gel Formula Dressing (DUODERM CGF DRESSING) MISC Apply 1 each topically every 3 (three) days. 20 each 0  . desonide (DESOWEN) 0.05 % lotion Apply 1 application topically 3 (three) times a week.    . diphenhydrAMINE (BENADRYL) 25 mg capsule Take 2 capsules (50 mg total) by mouth at bedtime. 60 capsule 3  . dutasteride (AVODART) 0.5 MG capsule TAKE ONE CAPSULE BY MOUTH EVERY DAY 30 capsule 5  . Emollient (CERAVE) CREA Apply 1 application topically. For dry skin on feet and legs    . ferrous sulfate 325 (65 FE) MG tablet Take 1 tablet (325 mg total) by mouth 2 (two) times daily with a meal. 60 tablet 5  . FLUoxetine (PROZAC) 20 MG capsule Take 10 mg by mouth daily.     Marland Kitchen ketoconazole (NIZORAL) 2 % cream Apply 1 application topically 2 (two) times daily. For face    . levothyroxine (SYNTHROID, LEVOTHROID) 50 MCG tablet TAKE 1 TABLET BY MOUTH EVERY MORNING 30 tablet 5  . lip balm (BLISTEX) OINT Apply 1 application topically 4 (four) times daily as needed for lip care.    . Loperamide HCl (IMODIUM A-D PO) Take by mouth as needed.    . nystatin cream (MYCOSTATIN) Apply 1 application topically 2 (two) times daily. 30 g 3  . polyethylene glycol powder (GLYCOLAX/MIRALAX) powder Add 1 packet to 8 oz water or juice.  If no bowel movement today, may repeat dose tomorrow. 3350 g 1  . QUEtiapine (SEROQUEL) 50 MG tablet Take 50 mg by mouth 2 (two) times daily.    . Simethicone (MYLANTA GAS PO) Take by mouth as needed.    . tamsulosin (FLOMAX) 0.4 MG CAPS capsule TAKE ONE CAPSULE BY MOUTH AT BEDTIME 30 capsule 5  . thioridazine (MELLARIL) 100 MG tablet Take 200 mg by mouth at bedtime.     Marland Kitchen thioridazine (MELLARIL) 50 MG tablet Take 1 tablet (50  mg total) by mouth every morning. 30 tablet 10   No current facility-administered medications on file prior to visit.    BP 102/63 mmHg  Pulse 83  Temp(Src) 98.3 F (36.8 C) (Oral)  Resp 16  Ht 5' 8.5" (1.74 m)  Wt 161 lb 9.6 oz (73.301 kg)  BMI 24.21 kg/m2  SpO2 97%    Objective:   Physical Exam  Constitutional: He appears well-developed and well-nourished. No distress.  HENT:  Head: Normocephalic and atraumatic.  Cardiovascular: Normal rate and regular rhythm.   No murmur heard. Pulmonary/Chest: Effort normal and breath sounds normal. No respiratory distress. He has no wheezes. He has no rales.  Musculoskeletal: He exhibits no edema.  Neurological: He is alert.  Skin: Skin is warm and dry.  Stage 1 sacral decub ulcer Dry skin noted on arms/trunks Ecchymosis noted beneath the right eye          Assessment & Plan:

## 2014-10-04 NOTE — Patient Instructions (Addendum)
Continue nystatin cream to buttocks twice daily.  Weight patient weekly and call if any further weight loss occurs. Complete lab work prior to leaving.  Please schedule a follow up appointment in 6 weeks.

## 2014-10-04 NOTE — Progress Notes (Signed)
Pre visit review using our clinic review tool, if applicable. No additional management support is needed unless otherwise documented below in the visit note. 

## 2014-10-05 DIAGNOSIS — R195 Other fecal abnormalities: Secondary | ICD-10-CM | POA: Insufficient documentation

## 2014-10-05 NOTE — Assessment & Plan Note (Signed)
Still appears to have a fungal component and I advised continued use of nystatin cream.  Monitor.

## 2014-10-05 NOTE — Assessment & Plan Note (Signed)
No obvious malignancy from work up thus far, though colo was incomplete. Thyroid function ok.  I have asked pt's caregiver to continue to weigh pt weekly and call if any further weight loss.

## 2014-10-05 NOTE — Assessment & Plan Note (Signed)
Lab Results  Component Value Date   WBC 8.4 09/16/2014   HGB 12.6* 09/16/2014   HCT 37.0* 09/16/2014   MCV 95.0 09/16/2014   PLT 261.0 09/16/2014   Blood count stable.

## 2014-10-08 ENCOUNTER — Ambulatory Visit (INDEPENDENT_AMBULATORY_CARE_PROVIDER_SITE_OTHER): Payer: Medicare Other

## 2014-10-08 DIAGNOSIS — B351 Tinea unguium: Secondary | ICD-10-CM | POA: Diagnosis not present

## 2014-10-08 DIAGNOSIS — M79676 Pain in unspecified toe(s): Secondary | ICD-10-CM

## 2014-10-08 DIAGNOSIS — E1140 Type 2 diabetes mellitus with diabetic neuropathy, unspecified: Secondary | ICD-10-CM | POA: Diagnosis not present

## 2014-10-08 NOTE — Progress Notes (Signed)
   Subjective:    Patient ID: Edward [name], male    DOB: Jan 15, 1946, 69 y.o.   MRN: [id number]  HPI patient presents this time for diabetic foot and nail care Toenails trim.  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

## 2014-10-09 ENCOUNTER — Telehealth: Payer: Self-pay | Admitting: *Deleted

## 2014-10-09 ENCOUNTER — Other Ambulatory Visit (INDEPENDENT_AMBULATORY_CARE_PROVIDER_SITE_OTHER): Payer: Medicare Other

## 2014-10-09 DIAGNOSIS — R634 Abnormal weight loss: Secondary | ICD-10-CM

## 2014-10-09 NOTE — Telephone Encounter (Signed)
Notified caregiver, [name] and she will bring pt to lab at 2pm today.  Future order placed.

## 2014-10-09 NOTE — Telephone Encounter (Signed)
-----   Message from Debbrah Alar, NP sent at 10/09/2014  8:42 AM EDT ----- Could you please have him come back for lab draw?   thanks ----- Message -----    From: SYSTEM    Sent: 10/09/2014  12:04 AM      To: Debbrah Alar, NP

## 2014-10-10 LAB — TSH: TSH: 8.2 u[IU]/mL — ABNORMAL HIGH (ref 0.35–4.50)

## 2014-10-11 ENCOUNTER — Telehealth: Payer: Self-pay | Admitting: Family

## 2014-10-11 MED ORDER — LEVOTHYROXINE SODIUM 75 MCG PO TABS: 75.0000 ug | ORAL_TABLET | Freq: Every day | ORAL | Status: DC

## 2014-10-11 NOTE — Telephone Encounter (Signed)
TSH shows synthroid should be increased from 50 to 51mcg. Repeat tsh (dx hypothyroid) in 6 weeks.

## 2014-10-11 NOTE — Telephone Encounter (Signed)
Notified pt's caregiver. Pt already scheduled for f/u on 11/15/14 and will obtain TSH at that visit.

## 2014-10-15 ENCOUNTER — Telehealth: Payer: Self-pay | Admitting: Family

## 2014-10-15 MED ORDER — POLYETHYLENE GLYCOL 3350 17 GM/SCOOP PO POWD: ORAL | Status: AC

## 2014-10-15 MED ORDER — POLYETHYLENE GLYCOL 3350 17 GM/SCOOP PO POWD: ORAL | Status: DC

## 2014-10-15 NOTE — Telephone Encounter (Signed)
OK to change miralax to once daily PRN constipation only.

## 2014-10-15 NOTE — Telephone Encounter (Signed)
Notified caregiver, [name]; sent Rx with new directions. Notified pt's sister.

## 2014-10-15 NOTE — Telephone Encounter (Signed)
Caller name:  sybil Relation to pt: sister Call back number: [phone] Pharmacy:  Reason for call:   Please call patient sister. She thinks that miralax is too strong for patient and is requesting dosage to be decreased.

## 2014-10-15 NOTE — Telephone Encounter (Signed)
Spoke with pt's sister. Pt states bowel movement comes on suddenly and he is unable to get to bathroom on time. Sister states stools are runny.  States this is causing problem at the group home as pt is unable to do all the cleanup himself. States chair in pt's room had dried fecal matter in the seams. Spoke with [name] at the group home and states staff have also noted daily, runny bowel movements. Wants to know if miralax dose can be changed to every other day? Please advise.

## 2014-10-24 DIAGNOSIS — R339 Retention of urine, unspecified: Secondary | ICD-10-CM | POA: Diagnosis not present

## 2014-10-30 DIAGNOSIS — F70 Mild intellectual disabilities: Secondary | ICD-10-CM | POA: Diagnosis not present

## 2014-10-30 DIAGNOSIS — F209 Schizophrenia, unspecified: Secondary | ICD-10-CM | POA: Diagnosis not present

## 2014-11-04 ENCOUNTER — Ambulatory Visit (INDEPENDENT_AMBULATORY_CARE_PROVIDER_SITE_OTHER): Payer: Medicare Other | Admitting: Family

## 2014-11-04 ENCOUNTER — Encounter: Payer: Self-pay | Admitting: Gastroenterology

## 2014-11-04 ENCOUNTER — Encounter: Payer: Self-pay | Admitting: Family

## 2014-11-04 VITALS — BP 84/68 | HR 72 | Temp 97.4°F | Resp 16 | Ht 68.5 in | Wt 154.6 lb

## 2014-11-04 DIAGNOSIS — R197 Diarrhea, unspecified: Secondary | ICD-10-CM | POA: Diagnosis not present

## 2014-11-04 DIAGNOSIS — R634 Abnormal weight loss: Secondary | ICD-10-CM | POA: Diagnosis not present

## 2014-11-04 DIAGNOSIS — G4700 Insomnia, unspecified: Secondary | ICD-10-CM

## 2014-11-04 DIAGNOSIS — I10 Essential (primary) hypertension: Secondary | ICD-10-CM

## 2014-11-04 DIAGNOSIS — R1310 Dysphagia, unspecified: Secondary | ICD-10-CM | POA: Insufficient documentation

## 2014-11-04 MED ORDER — ENSURE ACTIVE PO LIQD: 1.0000 | Freq: Three times a day (TID) | ORAL | Status: DC

## 2014-11-04 NOTE — Assessment & Plan Note (Signed)
Please keep a food diary x 1 week and email to me. Include beverages.  Add ensure one can 3 times daily with meals.   You will be contacted about your referral to GI.

## 2014-11-04 NOTE — Patient Instructions (Addendum)
Please keep a food diary x 1 week and email to me. Include beverages.  Add ensure one can 3 times daily with meals.   You will be contacted about your referral to GI. Complete stool studies and return to the lab at your earliest convenience.   Follow up in 1 month.

## 2014-11-04 NOTE — Assessment & Plan Note (Signed)
Follow up bp today 92/45- advised them to push fluids, will repeat next vist.

## 2014-11-04 NOTE — Assessment & Plan Note (Signed)
Advised follow up with psych for med adjustment to help with recent agitation and insomnia.

## 2014-11-04 NOTE — Progress Notes (Signed)
Subjective:    Patient ID: Edward [name], male    DOB: 09-26-1945, 69 y.o.   MRN: [id number]  HPI  Edward [name] is a 69 yr old male who presents today to discuss several issues.  He is accompanied by his sister [name] and two workers from his group home.   1) Diarrhea- currently only receiving miralax prn.  He is having episodes of fecal incontinence. Caregiver notes BM's are soft but not watery.   2) Swallowing problem- caregiver notes that pt is getting more choked on food recently.  3)  Insomnia- not sleeping well at night.  Notes that pt has been more agitated recently  4) Weight loss-  Eating 3 meals a day + snacks.   Continues to lose weight. He has had a recent CXR and abdominal CT- neither of which showed obvious malignancy.  He continues to have mild anemia and had a heme + stool, colonoscopy was incomplete due to poor bowel prep.  TSH was elevated last visit and synthroid dose was increased.  Wt Readings from Last 3 Encounters:  11/04/14 154 lb 9.6 oz (70.126 kg)  10/04/14 161 lb 9.6 oz (73.301 kg)  10/02/14 167 lb (75.751 kg)     Review of Systems See HPI    Past Medical History  Diagnosis Date  . Diabetes mellitus   . Urinary incontinence   . Mental retardation   . Schizophrenia   . BPH (benign prostatic hyperplasia)   . Thyroid disease   . Anemia   . Cataract   . Hypertension     DENIES    History   Social History  . Marital Status: Single    Spouse Name: N/A  . Number of Children: N/A  . Years of Education: N/A   Occupational History  . Not on file.   Social History Main Topics  . Smoking status: Never Smoker   . Smokeless tobacco: Current User    Types: Chew     Comment: chews tobacco  . Alcohol Use: No  . Drug Use: No  . Sexual Activity: Not on file   Other Topics Concern  . Not on file   Social History Narrative   Lives in a group home   Sister [name] is his legal guardian.   Sister and her family live locally    Past Surgical  History  Procedure Laterality Date  . Prostate surgery      x2, ?procedure    Family History  Problem Relation Age of Onset  . Arthritis Mother   . Arrhythmia Mother   . Lymphoma Mother     non-hodgkins  . Kidney disease Mother   . Heart disease Father   . Stroke Father     No Known Allergies  Current Outpatient Prescriptions on File Prior to Visit  Medication Sig Dispense Refill  . Acetaminophen (ACETAMINOPHEN EXTRA STRENGTH) 167 MG/5ML LIQD Take 15 mLs by mouth.    Marland Kitchen aspirin 81 MG tablet Take 81 mg by mouth daily.    . clotrimazole (LOTRIMIN) 1 % cream Apply 1 application topically daily as needed.    . Control Gel Formula Dressing (DUODERM CGF DRESSING) MISC Apply 1 each topically every 3 (three) days. 20 each 0  . desonide (DESOWEN) 0.05 % lotion Apply 1 application topically 3 (three) times a week.    . diphenhydrAMINE (BENADRYL) 25 mg capsule Take 2 capsules (50 mg total) by mouth at bedtime. 60 capsule 3  . dutasteride (AVODART) 0.5 MG capsule  TAKE ONE CAPSULE BY MOUTH EVERY DAY 30 capsule 5  . Emollient (CERAVE) CREA Apply 1 application topically. For dry skin on feet and legs    . ferrous sulfate 325 (65 FE) MG tablet Take 1 tablet (325 mg total) by mouth 2 (two) times daily with a meal. 60 tablet 5  . FLUoxetine (PROZAC) 20 MG capsule Take 10 mg by mouth daily.     Marland Kitchen ketoconazole (NIZORAL) 2 % cream Apply 1 application topically 2 (two) times daily. For face    . levothyroxine (SYNTHROID, LEVOTHROID) 75 MCG tablet Take 1 tablet (75 mcg total) by mouth daily. 30 tablet 2  . lip balm (BLISTEX) OINT Apply 1 application topically 4 (four) times daily as needed for lip care.    . Loperamide HCl (IMODIUM A-D PO) Take by mouth as needed.    . nystatin cream (MYCOSTATIN) Apply 1 application topically 2 (two) times daily. 30 g 3  . polyethylene glycol powder (GLYCOLAX/MIRALAX) powder 1 packet in 8 oz of water or juice once daily AS NEEDED for constipation 3350 g 1  . QUEtiapine  (SEROQUEL) 50 MG tablet Take 50 mg by mouth 2 (two) times daily.    . Simethicone (MYLANTA GAS PO) Take by mouth as needed.    . tamsulosin (FLOMAX) 0.4 MG CAPS capsule TAKE ONE CAPSULE BY MOUTH AT BEDTIME 30 capsule 5  . thioridazine (MELLARIL) 100 MG tablet Take 200 mg by mouth at bedtime.     Marland Kitchen thioridazine (MELLARIL) 50 MG tablet Take 1 tablet (50 mg total) by mouth every morning. 30 tablet 10   No current facility-administered medications on file prior to visit.    BP 84/68 mmHg  Pulse 72  Temp(Src) 97.4 F (36.3 C) (Oral)  Resp 16  Ht 5' 8.5" (1.74 m)  Wt 154 lb 9.6 oz (70.126 kg)  BMI 23.16 kg/m2  SpO2 99%    Objective:   Physical Exam  Constitutional: He appears well-developed and well-nourished. No distress.  Cardiovascular: Normal rate and regular rhythm.   No murmur heard. Pulmonary/Chest: Effort normal and breath sounds normal. No respiratory distress. He has no wheezes. He has no rales. He exhibits no tenderness.  Neurological: He is alert.  Skin: Skin is warm and dry.          Assessment & Plan:

## 2014-11-04 NOTE — Progress Notes (Signed)
Pre visit review using our clinic review tool, if applicable. No additional management support is needed unless otherwise documented below in the visit note. 

## 2014-11-04 NOTE — Assessment & Plan Note (Signed)
Refer back to GI for further evaluation of dysphagia and weight loss. Will obtain stool studies.

## 2014-11-05 ENCOUNTER — Telehealth: Payer: Self-pay | Admitting: Family

## 2014-11-05 NOTE — Telephone Encounter (Signed)
Edward [name] --Group home  Fiber pill- ferrous sulf 325mg 

## 2014-11-05 NOTE — Telephone Encounter (Signed)
Spoke with [name] at the group home and she states miralax is only given to pt as needed and they have not been giving it to him. States pt is still getting the fiber. Please advise.

## 2014-11-05 NOTE — Telephone Encounter (Signed)
Caller name: Ransier,[name] Relation to pt: self  Call back number: [phone]   Reason for call:  Sister is not happy that pt can not been seen by GI until June

## 2014-11-05 NOTE — Telephone Encounter (Signed)
Sister is picking him up on Friday to take [place] to the beach with her and her sister.  She would like the home to stop giving him any Miralax, feber etc today to see if he can be over the diarrhea by Friday and straightened out before they go out of town.  Please call her if she you need to speak with her.  Please fax an order to the home he stays in.  Spoke with Sister about the GI referral and she did not understand what they were offering her   She will call Gi back to day and try to get an appointment for Monday of next week with a PA.

## 2014-11-05 NOTE — Telephone Encounter (Signed)
Stop fiber and only use PRN constipation.

## 2014-11-05 NOTE — Telephone Encounter (Signed)
Notified Charlotte at group home and she requests that we fax new PRN order to pharmacy. Do not see fiber supplement on current med list. Called group home contact and they will look up pt's file and call me back with med name and dose.

## 2014-11-06 NOTE — Telephone Encounter (Signed)
Spoke with [name] from the group home and advised her that ferrous sulfate is an iron supplement, not fiber. She states she will verify pt's med list when she goes in today. Advised her to call and confirm if pt has a fiber pill on their med list and verify name. Left detailed message on pt's sister's voicemail that miralax was already stopped and to be used on as needed basis only.

## 2014-11-08 ENCOUNTER — Ambulatory Visit (INDEPENDENT_AMBULATORY_CARE_PROVIDER_SITE_OTHER): Payer: Medicare Other | Admitting: Nurse Practitioner

## 2014-11-08 ENCOUNTER — Encounter: Payer: Self-pay | Admitting: Nurse Practitioner

## 2014-11-08 VITALS — BP 100/52 | HR 76 | Ht 66.0 in | Wt 161.5 lb

## 2014-11-08 DIAGNOSIS — R197 Diarrhea, unspecified: Secondary | ICD-10-CM | POA: Diagnosis not present

## 2014-11-08 NOTE — Patient Instructions (Signed)
Hold the fiber and the Miralax.  Please call us next week and let us know how you are doing. [phone]  You may ask for Janetta Vandoren.

## 2014-11-08 NOTE — Progress Notes (Signed)
n MiraLAX and scheduled for colonoscopy for further evaluation. Unfortunately the prep was poor with stool still in the colon. Extent of the exam was only to the splenic flexure. No obstructing lesions in the left col.    Patient is here with his sister. He has had terrible diarrhea since colonoscopy. Lately having inconntinence / seepage of feces. . He gets a cipro every 6 weeks for a urologic procedure but no other antibiotics recently.  Current Medications, Allergies, Past Medical History, Past Surgical History, Family History and Social History were reviewed in [employer] record.  Studies:     History of Present Illness:   Patient is 69 year old male with mental retardation and history of schizophrenia. He lives in a group home. Patient was seen late February for rectal pressure and constipation. He was hemooccult positive. Patient was started on MiraLAX and scheduled for colonoscopy for further evaluation. Unfortunately the prep was poor with stool still in the colon. Extent of the exam was only to the splenic flexure. No obstructing lesions in the left col.   Patient is here with his sister. He has had terrible diarrhea since colonoscopy. Lately having inconntinence / seepage of feces. . He gets a cipro every 6 weeks for a urologic procedure but no other antibiotics recently. Miralx on hold, possibly since last week and patient had a normal BM this am.   Current Medications, Allergies, Past Medical History, Past Surgical History, Family History and Social History were reviewed in [employer] record.  Physical Exam: General: Pleasant, well developed , white male in no acute distress Head: Normocephalic and atraumatic Eyes:  sclerae anicteric, conjunctiva pink  Ears: Normal auditory acuity Lungs: Clear throughout to auscultation Heart: Regular rate and rhythm Abdomen: Soft, non distended, non-tender. No masses, no hepatomegaly. Normal bowel  sounds Rectal: no stool in vault Musculoskeletal: Symmetrical with no gross deformities  Extremities: No edema  Neurological: Alert oriented x 4, grossly nonfocal Psychological:  Alert and cooperative. Normal mood and affect  Assessment and Recommendations:  37. 69 year old male with diarrhea since colonoscopy early March. Actually sister just called group home and patient hasn't had any diarrhea since last week  But he may have been off Miralax. Patient had a normal formed bowel movement this am. He could of had some overflow diarrhea  vrs true diarrhea from miralax, fiber or even infection. At this point his sister will monitor closely off Mralax.. Since symptom have resolved will hold off on workup. If patient calls with recurrent diarrhea would definitely consider overflow.   2. Incomplete colonoscopy secondary to poor prep.

## 2014-11-10 ENCOUNTER — Telehealth: Payer: Self-pay | Admitting: Family

## 2014-11-10 NOTE — Telephone Encounter (Signed)
Please contact group home and remind them that we are waiting on stool samples and also a dietary record so I can review his caloric intake.

## 2014-11-11 DIAGNOSIS — R197 Diarrhea, unspecified: Secondary | ICD-10-CM | POA: Insufficient documentation

## 2014-11-11 NOTE — Progress Notes (Signed)
i agree with the above note, plan 

## 2014-11-11 NOTE — Telephone Encounter (Signed)
Spoke with [name] and she states that pt's sister kept him this weekend and she will combine her records with theirs and get them sent to [place].

## 2014-11-13 ENCOUNTER — Telehealth: Payer: Self-pay | Admitting: Nurse Practitioner

## 2014-11-13 DIAGNOSIS — R197 Diarrhea, unspecified: Secondary | ICD-10-CM | POA: Diagnosis not present

## 2014-11-13 NOTE — Telephone Encounter (Signed)
Food diary received and forwarded to Provider for review.  Please advise.

## 2014-11-13 NOTE — Telephone Encounter (Signed)
[name], the patient's sister called me to give a progress report on her brother, Edward [name].  She said he had a real good weekend.  He ate well, between 2000 and 2500 calories a day.  His bowels moved normally, no problems, normal stools.  He see's Debbrah Alar on Friday 11-15-2014.

## 2014-11-14 LAB — CLOSTRIDIUM DIFFICILE BY PCR: CDIFFPCR: NOT DETECTED

## 2014-11-14 LAB — OVA AND PARASITE EXAMINATION: OP: NONE SEEN

## 2014-11-14 NOTE — Telephone Encounter (Signed)
-----   Message from Willia Craze, NP sent at 11/14/2014  9:28 AM EDT ----- Good Morning, Yes, his weight loss is concerning but at least CTscan didn't reveal a malignancy. Colon cancer couldn't be excluded given poor prep on colonoscopy. His sister cared for him last weekend and left me a message that he ate very well and was having normal BMs again. Part of his weight loss may be what he is being served at group home - he has no teeth and cannot chew. Hate to subject him to another attempt at colonoscopy but if sister is open and patient can prep bowels then we could do a barium enema to try and visualize the right colon not seen on colonoscopy.  Thanks, Nevin Bloodgood

## 2014-11-15 ENCOUNTER — Ambulatory Visit (HOSPITAL_BASED_OUTPATIENT_CLINIC_OR_DEPARTMENT_OTHER)
Admission: RE | Admit: 2014-11-15 | Discharge: 2014-11-15 | Disposition: A | Discharge status: Home / Self Care | Payer: Medicare Other | Source: Ambulatory Visit | Attending: Family | Admitting: Family

## 2014-11-15 ENCOUNTER — Other Ambulatory Visit (HOSPITAL_COMMUNITY): Payer: Self-pay | Admitting: Family

## 2014-11-15 ENCOUNTER — Encounter: Payer: Self-pay | Admitting: Family

## 2014-11-15 ENCOUNTER — Ambulatory Visit (INDEPENDENT_AMBULATORY_CARE_PROVIDER_SITE_OTHER): Payer: Medicare Other | Admitting: Family

## 2014-11-15 ENCOUNTER — Telehealth: Payer: Self-pay | Admitting: Family

## 2014-11-15 VITALS — BP 100/60 | HR 73 | Temp 97.7°F | Resp 16 | Ht 68.5 in | Wt 162.8 lb

## 2014-11-15 DIAGNOSIS — R1310 Dysphagia, unspecified: Secondary | ICD-10-CM | POA: Diagnosis not present

## 2014-11-15 DIAGNOSIS — L89152 Pressure ulcer of sacral region, stage 2: Secondary | ICD-10-CM

## 2014-11-15 DIAGNOSIS — K6389 Other specified diseases of intestine: Secondary | ICD-10-CM | POA: Insufficient documentation

## 2014-11-15 DIAGNOSIS — R1314 Dysphagia, pharyngoesophageal phase: Secondary | ICD-10-CM

## 2014-11-15 DIAGNOSIS — E039 Hypothyroidism, unspecified: Secondary | ICD-10-CM | POA: Diagnosis not present

## 2014-11-15 DIAGNOSIS — K5900 Constipation, unspecified: Secondary | ICD-10-CM

## 2014-11-15 DIAGNOSIS — R634 Abnormal weight loss: Secondary | ICD-10-CM

## 2014-11-15 DIAGNOSIS — L98429 Non-pressure chronic ulcer of back with unspecified severity: Secondary | ICD-10-CM

## 2014-11-15 DIAGNOSIS — R109 Unspecified abdominal pain: Secondary | ICD-10-CM

## 2014-11-15 DIAGNOSIS — R1032 Left lower quadrant pain: Secondary | ICD-10-CM | POA: Diagnosis not present

## 2014-11-15 LAB — TSH: TSH: 8.3 u[IU]/mL — ABNORMAL HIGH (ref 0.35–4.50)

## 2014-11-15 MED ORDER — LEVOTHYROXINE SODIUM 100 MCG PO TABS: 100.0000 ug | ORAL_TABLET | Freq: Every day | ORAL | Status: DC

## 2014-11-15 NOTE — Telephone Encounter (Addendum)
So far stool studies are negative.  Thyroid testing shows that TSH still needs to be increased. Increase synthroid from 75 mcg to 158mcg, follow up tsh in 6 weeks.  X-ray of abdomen shows some distension of the colon and stool throughout the colon.  Most consistent with constipation.  Advise that they add prune juice once daily with repeat 2 view abdominal x ray in 1 week. Dx constipation

## 2014-11-15 NOTE — Progress Notes (Signed)
Pre visit review using our clinic review tool, if applicable. No additional management support is needed unless otherwise documented below in the visit note. 

## 2014-11-15 NOTE — Telephone Encounter (Signed)
Notified pt's sister and left message for caregiver to return my call.  Per verbal from PCP, ok to give 8oz. Prune juice daily. Future lab and xray order entered.  Need to verify with group home if they Rx for prune juice sent to pharmacy or how do they need the order sent? Lab appt needs to be scheduled in 6 wks.

## 2014-11-15 NOTE — Patient Instructions (Addendum)
You will be contacted about your referral for Barium swallow. Please let us know if you have not heard back within 1 week about your referral. Please complete x ray on the first floor.  Return urine at your convenience.

## 2014-11-15 NOTE — Assessment & Plan Note (Addendum)
Non-tender on exam.  I am concerned about constipation/fecal impaction, will obtain abdominal x ray to further evaluate. Obtain urinalysis.

## 2014-11-15 NOTE — Assessment & Plan Note (Signed)
Refer for MBS.

## 2014-11-15 NOTE — Telephone Encounter (Signed)
Notified Charlotte at group home. Lab appt scheduled for 01/03/15 at 1:30pm. Phone note faxed to group home [phone]).

## 2014-11-15 NOTE — Assessment & Plan Note (Signed)
Obtain follow up tsh, continue synthroid.  

## 2014-11-15 NOTE — Assessment & Plan Note (Signed)
Weight has improved.  Reviewed food log from his time with his sister and he ate well.  I suspect his caloric intake at the home is less and I have requested their food log.  Continue ensure.

## 2014-11-15 NOTE — Assessment & Plan Note (Signed)
His insurance will only cover duoderm if it is provided by [facility] RN service. Will arrange HH.

## 2014-11-15 NOTE — Progress Notes (Signed)
Subjective:    Patient ID: RAY GERVASI, male    DOB: 03/09/1946, 69 y.o.   MRN: [id number]  HPI   Mr. Wilkie is a 69 yr old mentally disabled male who presents today for follow up.   1) Weight loss-  His sister provided [place] with a food journal. Caloric estimates are as follows for the full days that he was with her and her family:  Sunday 11/10/14- 3410 calories Monday 11/11/14  Wt Readings from Last 3 Encounters:  11/15/14 162 lb 12.8 oz (73.846 kg)  11/08/14 161 lb 8 oz (73.256 kg)  11/04/14 154 lb 9.6 oz (70.126 kg)   2) Hypothyroid- maintained on synthroid 75 mcg. Lab Results  Component Value Date   TSH 8.20* 10/09/2014   Has had some frequent incontinence of stool    Group home requests mechanical soft diet order.  Notes that he is getting "choked more."  They request swallow evaluation.    Group home director expresses concern that there are not awake team members at night and that that they are afraid of him falling in the night or choking at night when he gets a snack.   Review of Systems See HPI  Past Medical History  Diagnosis Date  . Diabetes mellitus   . Urinary incontinence   . Mental retardation   . Schizophrenia   . BPH (benign prostatic hyperplasia)   . Thyroid disease   . Anemia   . Cataract   . Hypertension     DENIES    History   Social History  . Marital Status: Single    Spouse Name: N/A  . Number of Children: N/A  . Years of Education: N/A   Occupational History  . Not on file.   Social History Main Topics  . Smoking status: Never Smoker   . Smokeless tobacco: Current User    Types: Chew     Comment: chews tobacco  . Alcohol Use: No  . Drug Use: No  . Sexual Activity: Not on file   Other Topics Concern  . Not on file   Social History Narrative   Lives in a group home   Sister [name] is his legal guardian.   Sister and her family live locally    Past Surgical History  Procedure Laterality Date  . Prostate surgery       x2, ?procedure    Family History  Problem Relation Age of Onset  . Arthritis Mother   . Arrhythmia Mother   . Lymphoma Mother     non-hodgkins  . Kidney disease Mother   . Heart disease Father   . Stroke Father     No Known Allergies  Current Outpatient Prescriptions on File Prior to Visit  Medication Sig Dispense Refill  . Acetaminophen (ACETAMINOPHEN EXTRA STRENGTH) 167 MG/5ML LIQD Take 15 mLs by mouth.    Marland Kitchen aspirin 81 MG tablet Take 81 mg by mouth daily.    . clotrimazole (LOTRIMIN) 1 % cream Apply 1 application topically daily as needed.    . Control Gel Formula Dressing (DUODERM CGF DRESSING) MISC Apply 1 each topically every 3 (three) days. 20 each 0  . desonide (DESOWEN) 0.05 % lotion Apply 1 application topically 3 (three) times a week.    . diphenhydrAMINE (BENADRYL) 25 mg capsule Take 2 capsules (50 mg total) by mouth at bedtime. 60 capsule 3  . dutasteride (AVODART) 0.5 MG capsule TAKE ONE CAPSULE BY MOUTH EVERY DAY 30 capsule 5  .  Emollient (CERAVE) CREA Apply 1 application topically. For dry skin on feet and legs    . ferrous sulfate 325 (65 FE) MG tablet Take 1 tablet (325 mg total) by mouth 2 (two) times daily with a meal. 60 tablet 5  . FLUoxetine (PROZAC) 20 MG capsule Take 10 mg by mouth daily.     Marland Kitchen ketoconazole (NIZORAL) 2 % cream Apply 1 application topically 2 (two) times daily. For face    . levothyroxine (SYNTHROID, LEVOTHROID) 75 MCG tablet Take 1 tablet (75 mcg total) by mouth daily. 30 tablet 2  . lip balm (BLISTEX) OINT Apply 1 application topically 4 (four) times daily as needed for lip care.    . Loperamide HCl (IMODIUM A-D PO) Take by mouth as needed.    . Nutritional Supplements (ENSURE ACTIVE) LIQD Take 1 Can by mouth 3 (three) times daily. 90 Bottle 2  . nystatin cream (MYCOSTATIN) Apply 1 application topically 2 (two) times daily. 30 g 3  . polyethylene glycol powder (GLYCOLAX/MIRALAX) powder 1 packet in 8 oz of water or juice once daily AS  NEEDED for constipation 3350 g 1  . QUEtiapine (SEROQUEL) 50 MG tablet Take 50 mg by mouth 2 (two) times daily.    . Simethicone (MYLANTA GAS PO) Take by mouth as needed.    . tamsulosin (FLOMAX) 0.4 MG CAPS capsule TAKE ONE CAPSULE BY MOUTH AT BEDTIME 30 capsule 5  . thioridazine (MELLARIL) 100 MG tablet Take 200 mg by mouth at bedtime.     Marland Kitchen thioridazine (MELLARIL) 50 MG tablet Take 1 tablet (50 mg total) by mouth every morning. 30 tablet 10   No current facility-administered medications on file prior to visit.    BP 100/60 mmHg  Pulse 73  Temp(Src) 97.7 F (36.5 C) (Oral)  Resp 16  Ht 5' 8.5" (1.74 m)  Wt 162 lb 12.8 oz (73.846 kg)  BMI 24.39 kg/m2  SpO2 99%       Objective:   Physical Exam  Constitutional: He appears well-developed and well-nourished. No distress.  HENT:  Head: Normocephalic and atraumatic.  Cardiovascular: Normal rate and regular rhythm.   No murmur heard. Pulmonary/Chest: Effort normal and breath sounds normal. No respiratory distress. He has no wheezes. He has no rales.  Abdominal: Soft. Bowel sounds are normal. He exhibits no distension. There is no tenderness. There is no rebound.  Musculoskeletal: He exhibits no edema.  Neurological: He is alert.  Skin: Skin is warm and dry.  Stage 1 sacral decub with center area overlying coccyx which is stage 2          Assessment & Plan:  I did tell the sister and group home director that I do think it would be safer for him to have a staff member awake at night for safety purposes and they request a letter to be presented to the group home agency so they can facilitate. Letter provided.

## 2014-11-17 LAB — STOOL CULTURE

## 2014-11-18 ENCOUNTER — Other Ambulatory Visit (HOSPITAL_COMMUNITY): Payer: Self-pay | Admitting: Family

## 2014-11-18 ENCOUNTER — Telehealth: Payer: Self-pay | Admitting: Family

## 2014-11-18 DIAGNOSIS — R1314 Dysphagia, pharyngoesophageal phase: Secondary | ICD-10-CM

## 2014-11-18 NOTE — Telephone Encounter (Signed)
[name] called requesting orders please fax to home fax [phone] Attention [place]. Best [phone]

## 2014-11-18 NOTE — Telephone Encounter (Signed)
Note refaxed.

## 2014-11-18 NOTE — Telephone Encounter (Signed)
error:[id number] ° °

## 2014-11-22 ENCOUNTER — Other Ambulatory Visit: Payer: Self-pay

## 2014-11-26 ENCOUNTER — Ambulatory Visit (HOSPITAL_COMMUNITY)
Admission: RE | Admit: 2014-11-26 | Discharge: 2014-11-26 | Disposition: A | Discharge status: Home / Self Care | Payer: Medicare Other | Source: Ambulatory Visit | Attending: Family | Admitting: Family

## 2014-11-26 DIAGNOSIS — R1314 Dysphagia, pharyngoesophageal phase: Secondary | ICD-10-CM

## 2014-11-26 DIAGNOSIS — R1310 Dysphagia, unspecified: Secondary | ICD-10-CM | POA: Insufficient documentation

## 2014-11-26 NOTE — Procedures (Signed)
  Objective Swallowing Evaluation: Other (Comment) (MBS)  Patient Details  Name: Edward [name] MRN: [id number] Date of Birth: 1945-10-04  Today's Date: 11/26/2014 Time: No Data Recorded-No Data Recorded No Data Recorded  Past Medical History:  Past Medical History  Diagnosis Date  . Diabetes mellitus   . Urinary incontinence   . Mental retardation   . Schizophrenia   . BPH (benign prostatic hyperplasia)   . Thyroid disease   . Anemia   . Cataract   . Hypertension     DENIES   Past Surgical History:  Past Surgical History  Procedure Laterality Date  . Prostate surgery      x2, ?procedure   HPI:  Other Pertinent Information: 69 yo male referred for MBS due to pt coughing/gagging on food at times.  [name] pt's sister present and reports pt eats too fast and washes food down with liquids causing him to gag.  She did state when he was home with her over the last few weeks and she supervised his meals providing chopped meats,etc, pt tolerated meals well.  PMH + for schizophrenia, poor dentition, BPH, HLD, DM, diarrhea, anemia, eczema, severe weight loss.    Subjective: pt awake in chair, has schizophrenia - decreased ability to follow commands  Assessment / Plan / Recommendation  Pt presents with overall functional oropharyngeal swallow without aspiration of any consistency tested.  He did demonstrate trace flash penetration of puree *per radiologist verbal report* - likely due to minimal delay in pharyngeal swallow.  Pt is impulsive and benefited from cues during evaluation to take small bolus size.    SLP observed minimal penetration of thin (x3) during session - x1 to vocal folds with straw that cleared independently.  His swallow was overall timely and strong with only minimal vallecular residuals with solids.  Cued dry swallows effective to clear vallecular residuals.  Pt able to swallow barium tablet adequately with thin barium.    Pt did not cough or gag during MBS today and  was in controlled environment.  SLP suspects pt's impulsivity/behavior is largest factor causing occasional coughing.  Also poor dentition factor as well.    Recommend diet as tolerated with FULL SUPERVISION to maximize airway protection with pt's impulsivity.  Provided sister Golda Acre with information re: xerostomia and dysphagia mitigation strategies as well as alternative utensils/cups (provale cup, bolus flow straws, children's utensils,etc).    Thanks for this referral.          CHL IP DIET RECOMMENDATION 11/26/2014  SLP Diet Recommendations Age appropriate regular solids;Thin  Liquid Administration via As tolerated  Medication Administration Whole meds with liquid  Compensations Externally pace;Small sips/bites;Slow rate;Minimize environmental distractions  Postural Changes and/or Swallow Maneuvers (None)         CHL IP REASON FOR REFERRAL 11/26/2014  Reason for Referral Objectively evaluate swallowing function     CHL IP ORAL PHASE 11/26/2014  Oral Phase WFL         CHL IP CERVICAL ESOPHAGEAL PHASE 11/26/2014  Cervical Esophageal Phase WFL    CHL IP GO 11/26/2014  Functional Assessment Tool Used MBS, clinical judgement  Functional Limitations Swallowing  Swallow Current Status (X5284) CI  Swallow Goal Status (X3244) CI  Swallow Discharge Status (W1027) [place], [place] SLP [phone]

## 2014-11-27 ENCOUNTER — Encounter: Payer: Self-pay | Admitting: Family

## 2014-11-27 ENCOUNTER — Telehealth: Payer: Self-pay | Admitting: Family

## 2014-11-27 DIAGNOSIS — R296 Repeated falls: Secondary | ICD-10-CM

## 2014-11-27 NOTE — Telephone Encounter (Signed)
Please see letter.

## 2014-11-27 NOTE — Telephone Encounter (Signed)
Lattie Haw called back pointing out a few spelling errors in letter. Also requested that verbage; "patient would benefit from a higher level of care" be changed to "needs higher level of care" to ensure that pt will not lose his funding and to indicate that pt truly needs higher care level and not merely a suggestion. Corrections made to letter and faxed again.  Please advise re: walker mentioned in original message below.

## 2014-11-27 NOTE — Telephone Encounter (Signed)
Caller name: charlotte Relation to pt: Call back number: [phone] Pharmacy:  Reason for call:   Please call Ginette Pitman. She states that she needs several orders faxed over for patient.

## 2014-11-27 NOTE — Telephone Encounter (Signed)
See 11/27/14 phone note.

## 2014-11-27 NOTE — Telephone Encounter (Signed)
Letter printed and faxed to below #Please advise re: Walker. See below.

## 2014-11-27 NOTE — Telephone Encounter (Signed)
Spoke with Brien Mates, Manager at group home: She confirmed the following information. States their goal is to get pt into and ICF (intermittent care facility--not assisted living) so pt will maintain his funding and have the care / staff that he is now requiring. They are also asking for orders for a new or different type of walker. She states that pt is very hard on his current walker. Facility going through 4 tennis balls a week on the base of walker and one side does not want to stay open. States pt bears all weight down it and bangs into everything with it.  She asks that we fax both letters to her at [phone]; cell # [phone] if any questions.   Bill went for his barium swallow test yesterday and they gave [place] some "recommendations" that we need orders written for   1. Must have full supervision while eating so that he can pace himself  2. Must start each meal with liquid and end with liquid  3. Must use children's utensils - Must put the utensils down during each bite  4. Mechananical soft diet (already ordered but need it written in the same letter with these)  5. Must use a bolus flow bup or sippy cup  6. Must take medicine with applesauce   We need a letter that states he needs a "higher" level of care because where he lives now is not equipped for his changing needs.   BB is showing signs of dementia with the yelling more, getting upset, choking problems, walking problems, he isn't sleeping through the night so then he is sleepy during the day.   The staff at group home are now completely doing all his laundry, washing his bed linens daily to reduce urine smell in his room and the house, making his bed, helping him change clothes, helping change his depends, going with him to the bathroom, showering him, shaving him, basically all hygiene, taking his trash in his room out for him, fixing his plate at meal times, mechanical soft diet and then they sit with him during the meals to  assist. They give him half of his food first and have him put his spoon down and drink in between bites then when he is close to finishing the first half they give him the other half of his portions. He does better that way because when he sees a lot of food at one time he tends to eat faster to hurry and finish.

## 2014-11-27 NOTE — Telephone Encounter (Signed)
Caregiver would like to speak with Gilmore Laroche regarding fax that was sent over. Please call Brien Mates

## 2014-11-27 NOTE — Telephone Encounter (Signed)
Ok to give order for rolling walker.

## 2014-11-30 DIAGNOSIS — R1310 Dysphagia, unspecified: Secondary | ICD-10-CM | POA: Diagnosis not present

## 2014-11-30 DIAGNOSIS — F209 Schizophrenia, unspecified: Secondary | ICD-10-CM | POA: Diagnosis not present

## 2014-11-30 DIAGNOSIS — F79 Unspecified intellectual disabilities: Secondary | ICD-10-CM | POA: Diagnosis not present

## 2014-11-30 DIAGNOSIS — E039 Hypothyroidism, unspecified: Secondary | ICD-10-CM | POA: Diagnosis not present

## 2014-11-30 DIAGNOSIS — L89152 Pressure ulcer of sacral region, stage 2: Secondary | ICD-10-CM | POA: Diagnosis not present

## 2014-11-30 DIAGNOSIS — I10 Essential (primary) hypertension: Secondary | ICD-10-CM | POA: Diagnosis not present

## 2014-11-30 DIAGNOSIS — R1032 Left lower quadrant pain: Secondary | ICD-10-CM | POA: Diagnosis not present

## 2014-11-30 DIAGNOSIS — E119 Type 2 diabetes mellitus without complications: Secondary | ICD-10-CM | POA: Diagnosis not present

## 2014-12-02 ENCOUNTER — Telehealth: Payer: Self-pay

## 2014-12-02 ENCOUNTER — Telehealth: Payer: Self-pay | Admitting: Family

## 2014-12-02 NOTE — Telephone Encounter (Signed)
Noted. Will discuss at upcoming apt.

## 2014-12-02 NOTE — Telephone Encounter (Signed)
Please let pt know that I reviewed the swallow test and these are the recommendations of the speech pathologist. They did recommend diet as tolerated, however the recommendations that they gave as outlined in letter are to decrease risk of choking/aspiration. If we lift these recommendations, we increase risk of choking/pneumonia, however since she is HCPOA/legal guardian,  if she does not wish to subject him to these restrictions she has the right to ask that they be lifted despite known aspiration risk.

## 2014-12-02 NOTE — Telephone Encounter (Signed)
Notified pt's sister. She remains concerned that these were only "recommendations" and that they were meant to be permanent restrictions for the rest of his life. She feels the home has been negligent in his dietary care. She scheduled appt for Wednesday at 10:30am.

## 2014-12-02 NOTE — Telephone Encounter (Signed)
Spoke with pt's sister. She is concerned about dietary restrictions that were outlined in the letter we submitted. States these recommendations were made based on discussion she had with the speech pathologist at the time of the test. She is concerned that the home contacted [place] requesting a letter with these restrictions.  She is not wanting these restrictions to follow pt to his next facility as she feels these restrictions are too inhibitive for pt and that he is capable of eating a regular diet.  She is concerned that he will continue to have problems with his bowels and lose weight again.  Please advise.

## 2014-12-02 NOTE — Telephone Encounter (Signed)
-----   Message from Willia Craze, NP sent at 12/02/2014 10:40 AM EDT ----- Barbera Setters, sometime in the next couple of days will you call patient's sister (he lives in group home) and just make sure patient's weight has stabilized and that he bowel changes normalized.  He had lost a lot of weight and had altered bowels for which we attempted colonoscopy but didn't get to see right colon. If he isn't doing well I may get a barium enema to exclude any gross right sided colon lesions. Thanks

## 2014-12-02 NOTE — Telephone Encounter (Signed)
Caller: [name] Canales REL TO PT: Sister & legal guardian for pt Ph#: [phone]  Please call her in regards to order Lenna Sciara wrote concerning swallow test.

## 2014-12-02 NOTE — Telephone Encounter (Signed)
I spoke with his sister and she reports that he "is doing much better".  He is on a normal diet and is gaining weight.  She believes that it was an 8 lb increase in the last week.  She reports that he spent a week with her and that he was doing well and "toiliting normal".  He is using prune juice and fruits and vegetables and his symptoms have improved.

## 2014-12-03 DIAGNOSIS — I10 Essential (primary) hypertension: Secondary | ICD-10-CM | POA: Diagnosis not present

## 2014-12-03 DIAGNOSIS — E119 Type 2 diabetes mellitus without complications: Secondary | ICD-10-CM | POA: Diagnosis not present

## 2014-12-03 DIAGNOSIS — F209 Schizophrenia, unspecified: Secondary | ICD-10-CM | POA: Diagnosis not present

## 2014-12-03 DIAGNOSIS — R1310 Dysphagia, unspecified: Secondary | ICD-10-CM | POA: Diagnosis not present

## 2014-12-03 DIAGNOSIS — R1032 Left lower quadrant pain: Secondary | ICD-10-CM | POA: Diagnosis not present

## 2014-12-03 DIAGNOSIS — L89152 Pressure ulcer of sacral region, stage 2: Secondary | ICD-10-CM | POA: Diagnosis not present

## 2014-12-03 NOTE — Telephone Encounter (Signed)
Great news. I will let PCP know, she was concerned about his weight loss.

## 2014-12-04 ENCOUNTER — Encounter: Payer: Self-pay | Admitting: Family

## 2014-12-04 ENCOUNTER — Ambulatory Visit (INDEPENDENT_AMBULATORY_CARE_PROVIDER_SITE_OTHER): Payer: Medicare Other | Admitting: Family

## 2014-12-04 VITALS — BP 90/60 | HR 63 | Temp 97.8°F | Resp 16 | Ht 68.5 in | Wt 162.4 lb

## 2014-12-04 DIAGNOSIS — R634 Abnormal weight loss: Secondary | ICD-10-CM | POA: Diagnosis not present

## 2014-12-04 NOTE — Patient Instructions (Signed)
Follow up in 2 months

## 2014-12-04 NOTE — Progress Notes (Signed)
Pre visit review using our clinic review tool, if applicable. No additional management support is needed unless otherwise documented below in the visit note. 

## 2014-12-04 NOTE — Telephone Encounter (Signed)
Rx printed and faxed to below #.

## 2014-12-04 NOTE — Progress Notes (Signed)
Subjective:    Patient ID: Edward [name], male    DOB: 11-06-1945, 69 y.o.   MRN: [id number]  HPI  Edward [name] is a 69 yr old male who presents today for follow up of his weight.  Sister is very upset with the group home where he lives because she reports that she does not feel that they are feeding him nutritious meals or often enough. She wants to make sure that he does not have any restrictions to his diet that would prevent him from enjoying his food or eating an adequate amount of calories. She is also concerned that they do not change his diaper frequently enough.  The patient will soon be moving to another home where he will have 24 hour supervision.   Wt Readings from Last 3 Encounters:  12/04/14 162 lb 6.4 oz (73.664 kg)  11/15/14 162 lb 12.8 oz (73.846 kg)  11/08/14 161 lb 8 oz (73.256 kg)   Sister- requests regular diet.  Meats to be finely chopped. Ensure twice daily between meals and at night with snack.  Small utensils, sippy cups.    Review of Systems    see HPI  Past Medical History  Diagnosis Date  . Diabetes mellitus   . Urinary incontinence   . Mental retardation   . Schizophrenia   . BPH (benign prostatic hyperplasia)   . Thyroid disease   . Anemia   . Cataract   . Hypertension     DENIES    History   Social History  . Marital Status: Single    Spouse Name: N/A  . Number of Children: N/A  . Years of Education: N/A   Occupational History  . Not on file.   Social History Main Topics  . Smoking status: Never Smoker   . Smokeless tobacco: Current User    Types: Chew     Comment: chews tobacco  . Alcohol Use: No  . Drug Use: No  . Sexual Activity: Not on file   Other Topics Concern  . Not on file   Social History Narrative   Lives in a group home   Sister [name] is his legal guardian.   Sister and her family live locally    Past Surgical History  Procedure Laterality Date  . Prostate surgery      x2, ?procedure    Family History    Problem Relation Age of Onset  . Arthritis Mother   . Arrhythmia Mother   . Lymphoma Mother     non-hodgkins  . Kidney disease Mother   . Heart disease Father   . Stroke Father     No Known Allergies  Current Outpatient Prescriptions on File Prior to Visit  Medication Sig Dispense Refill  . Acetaminophen (ACETAMINOPHEN EXTRA STRENGTH) 167 MG/5ML LIQD Take 15 mLs by mouth.    Marland Kitchen aspirin 81 MG tablet Take 81 mg by mouth daily.    . clotrimazole (LOTRIMIN) 1 % cream Apply 1 application topically daily as needed.    . Control Gel Formula Dressing (DUODERM CGF DRESSING) MISC Apply 1 each topically every 3 (three) days. 20 each 0  . desonide (DESOWEN) 0.05 % lotion Apply 1 application topically 3 (three) times a week.    . diphenhydrAMINE (BENADRYL) 25 mg capsule Take 2 capsules (50 mg total) by mouth at bedtime. 60 capsule 3  . dutasteride (AVODART) 0.5 MG capsule TAKE ONE CAPSULE BY MOUTH EVERY DAY 30 capsule 5  . Emollient (CERAVE) CREA Apply  1 application topically. For dry skin on feet and legs    . ferrous sulfate 325 (65 FE) MG tablet Take 1 tablet (325 mg total) by mouth 2 (two) times daily with a meal. 60 tablet 5  . FLUoxetine (PROZAC) 20 MG capsule Take 10 mg by mouth daily.     Marland Kitchen ketoconazole (NIZORAL) 2 % cream Apply 1 application topically 2 (two) times daily. For face    . levothyroxine (SYNTHROID, LEVOTHROID) 100 MCG tablet Take 1 tablet (100 mcg total) by mouth daily. 30 tablet 2  . lip balm (BLISTEX) OINT Apply 1 application topically 4 (four) times daily as needed for lip care.    . Loperamide HCl (IMODIUM A-D PO) Take by mouth as needed.    . Nutritional Supplements (ENSURE ACTIVE) LIQD Take 1 Can by mouth 3 (three) times daily. 90 Bottle 2  . nystatin cream (MYCOSTATIN) Apply 1 application topically 2 (two) times daily. 30 g 3  . polyethylene glycol powder (GLYCOLAX/MIRALAX) powder 1 packet in 8 oz of water or juice once daily AS NEEDED for constipation 3350 g 1  .  QUEtiapine (SEROQUEL) 50 MG tablet Take 50 mg by mouth 2 (two) times daily.    . Simethicone (MYLANTA GAS PO) Take by mouth as needed.    . tamsulosin (FLOMAX) 0.4 MG CAPS capsule TAKE ONE CAPSULE BY MOUTH AT BEDTIME 30 capsule 5  . thioridazine (MELLARIL) 100 MG tablet Take 200 mg by mouth at bedtime.     Marland Kitchen thioridazine (MELLARIL) 50 MG tablet Take 1 tablet (50 mg total) by mouth every morning. 30 tablet 10   No current facility-administered medications on file prior to visit.    BP 90/60 mmHg  Pulse 63  Temp(Src) 97.8 F (36.6 C) (Oral)  Resp 16  Ht 5' 8.5" (1.74 m)  Wt 162 lb 6.4 oz (73.664 kg)  BMI 24.33 kg/m2  SpO2 94%    Objective:   Physical Exam  Constitutional: He appears well-developed and well-nourished. No distress.  HENT:  Head: Normocephalic and atraumatic.  Cardiovascular: Normal rate.   Pulmonary/Chest: Effort normal and breath sounds normal. No respiratory distress. He has no wheezes. He has no rales. He exhibits no tenderness.  Musculoskeletal: He exhibits no edema.  Neurological: He is alert.  Skin: Skin is warm and dry.          Assessment & Plan:

## 2014-12-05 ENCOUNTER — Telehealth: Payer: Self-pay | Admitting: *Deleted

## 2014-12-05 DIAGNOSIS — R339 Retention of urine, unspecified: Secondary | ICD-10-CM | POA: Diagnosis not present

## 2014-12-05 DIAGNOSIS — N401 Enlarged prostate with lower urinary tract symptoms: Secondary | ICD-10-CM | POA: Diagnosis not present

## 2014-12-05 DIAGNOSIS — N281 Cyst of kidney, acquired: Secondary | ICD-10-CM | POA: Diagnosis not present

## 2014-12-05 DIAGNOSIS — R32 Unspecified urinary incontinence: Secondary | ICD-10-CM | POA: Diagnosis not present

## 2014-12-05 NOTE — Telephone Encounter (Signed)
Home health certification and plan of care received via fax from [facility]. Forwarded to [employer]. JG//[id number]

## 2014-12-06 ENCOUNTER — Telehealth: Payer: Self-pay | Admitting: Family

## 2014-12-06 DIAGNOSIS — R1310 Dysphagia, unspecified: Secondary | ICD-10-CM | POA: Diagnosis not present

## 2014-12-06 DIAGNOSIS — R1032 Left lower quadrant pain: Secondary | ICD-10-CM | POA: Diagnosis not present

## 2014-12-06 DIAGNOSIS — L89152 Pressure ulcer of sacral region, stage 2: Secondary | ICD-10-CM | POA: Diagnosis not present

## 2014-12-06 DIAGNOSIS — F209 Schizophrenia, unspecified: Secondary | ICD-10-CM | POA: Diagnosis not present

## 2014-12-06 DIAGNOSIS — I10 Essential (primary) hypertension: Secondary | ICD-10-CM | POA: Diagnosis not present

## 2014-12-06 DIAGNOSIS — E119 Type 2 diabetes mellitus without complications: Secondary | ICD-10-CM | POA: Diagnosis not present

## 2014-12-06 NOTE — Telephone Encounter (Signed)
Caller name: Edward [name] from [place] Relation to pt: Call back number: [phone] Pharmacy:   Reason for call:   Wants to know if Edward [name] can send in a stronger cream for rash. Nystatin is not working.

## 2014-12-08 MED ORDER — RESTORE BARRIER CREA: TOPICAL_CREAM | Status: DC

## 2014-12-08 NOTE — Assessment & Plan Note (Signed)
This has leveled off.  I did tell the sister that I never received the food journals from the home which I had requested.  I do believe that his weight loss and his anemia are related to his nutritional deficits.  I hope that at his new residence he will be provided a better diet.  I did discuss with the sister that the recommendations made by the speech therapist were intended to decrease risk of choking/aspiration and pneumonia. She verbalizes understanding and understands these risks.  Nonetheless, she has agreed to the following diet recomedations as listed in letter for the group home:   Regular diet as tolerated- needs supervision with all meals. Meats should be finely chopped, not pureed. Pt to use sippy cup. Pt to use small utensils such as teaspoon, salad fork. Ensure one can twice daily between meals and 1 can at bedtime with snack. Ensure is not to replace meals.   30 minutes spent with pt and sister.  >50% of this time was spent counseling pt and sister on diet, weight loss, dysphagia/speech pathology report.

## 2014-12-08 NOTE — Telephone Encounter (Signed)
Rx sent to pharmacy for restore barrier cream to be applied to affected area after each diaper change.  They should continue nystatin cream bid.  If rash worsens or if he develops further skin breakdown, he should be re-evaluated in the office.

## 2014-12-09 ENCOUNTER — Telehealth: Payer: Self-pay | Admitting: Family

## 2014-12-09 MED ORDER — CLOTRIMAZOLE-BETAMETHASONE 1-0.05 % EX CREA: 1.0000 "application " | TOPICAL_CREAM | Freq: Two times a day (BID) | CUTANEOUS | Status: DC

## 2014-12-09 NOTE — Telephone Encounter (Signed)
Caller: Dequane Strahan Ph# [phone]  Pt sister called regarding RX for Ensure Active. Pt here on 11/04/14 and she states she got RX 136.91 for 3 cases of 24 of Ensure Active. On 11/28/14 they billed again for 3 cases of 24 Ensure Lite bill of 226.73. Sent from [place]. She called the group home and asked why it was switched. They stated they received order for the Ensure Lite. Please call her back.

## 2014-12-09 NOTE — Telephone Encounter (Signed)
Spoke with Elmyra Ricks at [facility]. She states they are no longer able to purchase Ensure Active NDC# that we previously ordered. They are able to get: ensure lite (vanilla/chocolate), ensure clear (mixed berry, apple) or ensure high protein (vanilla / chocolate). Group Home requested refill on 11/28/14 and requested vanilla flavor. Pharmacy did not want to dispense high protein unless specified by PCP so they substituted with the Ensure Light. They have told me that the cost has been adjusted in their system to $153.58 and will reflect that cost going forward.  Notified pt's sister and she is concerned about him being switched to Ensure Light as they are wanting him to gain weight and get supplemental nutrients. Please advise which of the above alternatives would be comparable?  Spoke with pt's sister and confirmed below information. Advised her I will call the pharmacy to get further clarification.

## 2014-12-09 NOTE — Telephone Encounter (Signed)
OK to dispense high protein.

## 2014-12-09 NOTE — Telephone Encounter (Signed)
Notified Tiffany at [facility]. She states she doesn't think the new cream will heal the rash as it looks more like yeast to her. She is requesting something like "lotrisone". States there is currently no skin breakdown. Rash is just "red and yeasty looking".  Please advise.

## 2014-12-09 NOTE — Telephone Encounter (Signed)
Faxed note to [facility] to cancel Restore Rx and see new Rx for Lotrisone. Med list updated. Notified Charlotte at group home and Medical sales representative at [facility].

## 2014-12-09 NOTE — Telephone Encounter (Signed)
OK, rx sent for lotrisone, please cancel restore rx.

## 2014-12-09 NOTE — Telephone Encounter (Signed)
Notified pt's sister. She states she has spoken to the group home and they told her she can purchase ensure from local pharmacy if cheaper and supply for pt to take. Golda Acre is going to check with [facility] and call us back with available alternatives.

## 2014-12-10 DIAGNOSIS — R1310 Dysphagia, unspecified: Secondary | ICD-10-CM | POA: Diagnosis not present

## 2014-12-10 DIAGNOSIS — R1032 Left lower quadrant pain: Secondary | ICD-10-CM | POA: Diagnosis not present

## 2014-12-10 DIAGNOSIS — E119 Type 2 diabetes mellitus without complications: Secondary | ICD-10-CM | POA: Diagnosis not present

## 2014-12-10 DIAGNOSIS — I10 Essential (primary) hypertension: Secondary | ICD-10-CM | POA: Diagnosis not present

## 2014-12-10 DIAGNOSIS — L89152 Pressure ulcer of sacral region, stage 2: Secondary | ICD-10-CM | POA: Diagnosis not present

## 2014-12-10 DIAGNOSIS — F209 Schizophrenia, unspecified: Secondary | ICD-10-CM | POA: Diagnosis not present

## 2014-12-10 NOTE — Telephone Encounter (Signed)
Faxed successfully to [facility]

## 2014-12-10 NOTE — Telephone Encounter (Signed)
Spoke with pt's sister, Edward [name]. She states she has talked with Edward [name] at the group home and was told that we could send ensure Rx to a local [facility] for sister to pick up and bring to the group home for pt to take. Edward [name] states this is cheaper to go through [facility]. Options they have available are:  Ensure Plus  350 cal /serving $31.88 / case Ensure  200 cal / serving  $27.94/case Boost High Protein  240 cal / serving  $24.68 /case  Please advise appropriate alternative.

## 2014-12-11 NOTE — Telephone Encounter (Signed)
Please see hand written rx for ensure one can tid #90 with 5 refills.

## 2014-12-11 NOTE — Telephone Encounter (Addendum)
Notified pt's sister and faxed handwritten rx to [facility] @ [phone]. If all goes ok with local pharmacy she will call [place] at group home and cancel current Ensure Rx.

## 2014-12-16 DIAGNOSIS — L89152 Pressure ulcer of sacral region, stage 2: Secondary | ICD-10-CM | POA: Diagnosis not present

## 2014-12-16 DIAGNOSIS — R1310 Dysphagia, unspecified: Secondary | ICD-10-CM | POA: Diagnosis not present

## 2014-12-16 DIAGNOSIS — F209 Schizophrenia, unspecified: Secondary | ICD-10-CM | POA: Diagnosis not present

## 2014-12-16 DIAGNOSIS — E119 Type 2 diabetes mellitus without complications: Secondary | ICD-10-CM | POA: Diagnosis not present

## 2014-12-16 DIAGNOSIS — I10 Essential (primary) hypertension: Secondary | ICD-10-CM | POA: Diagnosis not present

## 2014-12-16 DIAGNOSIS — R1032 Left lower quadrant pain: Secondary | ICD-10-CM | POA: Diagnosis not present

## 2014-12-19 DIAGNOSIS — I10 Essential (primary) hypertension: Secondary | ICD-10-CM | POA: Diagnosis not present

## 2014-12-19 DIAGNOSIS — R1032 Left lower quadrant pain: Secondary | ICD-10-CM | POA: Diagnosis not present

## 2014-12-19 DIAGNOSIS — E119 Type 2 diabetes mellitus without complications: Secondary | ICD-10-CM | POA: Diagnosis not present

## 2014-12-19 DIAGNOSIS — L89152 Pressure ulcer of sacral region, stage 2: Secondary | ICD-10-CM | POA: Diagnosis not present

## 2014-12-19 DIAGNOSIS — R1310 Dysphagia, unspecified: Secondary | ICD-10-CM | POA: Diagnosis not present

## 2014-12-19 DIAGNOSIS — F209 Schizophrenia, unspecified: Secondary | ICD-10-CM | POA: Diagnosis not present

## 2014-12-23 DIAGNOSIS — L89152 Pressure ulcer of sacral region, stage 2: Secondary | ICD-10-CM | POA: Diagnosis not present

## 2014-12-23 DIAGNOSIS — R1310 Dysphagia, unspecified: Secondary | ICD-10-CM | POA: Diagnosis not present

## 2014-12-23 DIAGNOSIS — I10 Essential (primary) hypertension: Secondary | ICD-10-CM | POA: Diagnosis not present

## 2014-12-23 DIAGNOSIS — F209 Schizophrenia, unspecified: Secondary | ICD-10-CM | POA: Diagnosis not present

## 2014-12-23 DIAGNOSIS — R1032 Left lower quadrant pain: Secondary | ICD-10-CM | POA: Diagnosis not present

## 2014-12-23 DIAGNOSIS — E119 Type 2 diabetes mellitus without complications: Secondary | ICD-10-CM | POA: Diagnosis not present

## 2014-12-26 ENCOUNTER — Encounter: Payer: Self-pay | Admitting: Medical

## 2014-12-26 ENCOUNTER — Ambulatory Visit (INDEPENDENT_AMBULATORY_CARE_PROVIDER_SITE_OTHER): Payer: Medicare Other | Admitting: Medical

## 2014-12-26 VITALS — BP 124/67 | HR 85 | Temp 98.1°F | Ht 68.5 in | Wt 170.6 lb

## 2014-12-26 DIAGNOSIS — K5900 Constipation, unspecified: Secondary | ICD-10-CM | POA: Diagnosis not present

## 2014-12-26 NOTE — Assessment & Plan Note (Signed)
On and off and diffictult to assess severity presently.  We are approaching weekend and our lab is closed. Get cmp and cbc tomorrow am stat at 8 am. Then abdominal xray.     Take miralax tonight if none given today. If already gave today then give tomorrow am when awakes.  Get abd xray tomorrow and will asses xray.  If concern for dilated bowel/obstruction then ct abdomen with contrast.  If symtoms worsen over night then ED evaluation.

## 2014-12-26 NOTE — Progress Notes (Signed)
Pre visit review using our clinic review tool, if applicable. No additional management support is needed unless otherwise documented below in the visit note. 

## 2014-12-26 NOTE — Patient Instructions (Signed)
Constipation On and off and diffictult to assess severity presently.  We are approaching weekend and our lab is closed. Get cmp and cbc tomorrow am stat at 8 am. Then abdominal xray.     Take miralax tonight if none given today. If already gave today then give tomorrow am when awakes.  Get abd xray tomorrow and will asses xray.  If concern for dilated bowel/obstruction then ct abdomen with contrast.  If symtoms worsen over night then ED evaluation.

## 2014-12-26 NOTE — Progress Notes (Signed)
Subjective:    Patient ID: Edward [name], male    DOB: [birth year], 69 y.o.   MRN: [id number]  HPI   Pt in with guardian. Pt has some constipated. Pt with sister. They do not know how many days without any bm prior today.(does not describe type of volume of bm today) Pt was on miralax in the past and it did resolve his prior constipation. Pt was given miralax yesterday. But sister states this afternoon he had bm this afternoon. And another second bm later this afternoon. But sister not sure if he was given miralax today.  Pt just describes pulling sensation to rectum.   No fever, no chills, no nausea and no vomiting.     Review of Systems  Constitutional: Negative for fever, chills, diaphoresis, activity change and fatigue.  Respiratory: Negative for cough, chest tightness and shortness of breath.   Cardiovascular: Negative for chest pain, palpitations and leg swelling.  Gastrointestinal: Positive for constipation. Negative for nausea, vomiting and abdominal pain.  Musculoskeletal: Negative for neck pain and neck stiffness.  Neurological: Negative for dizziness, tremors, seizures, syncope, facial asymmetry, speech difficulty, weakness, light-headedness, numbness and headaches.  Psychiatric/Behavioral: Negative for behavioral problems, confusion and agitation. The patient is not nervous/anxious.     Past Medical History  Diagnosis Date  . Diabetes mellitus   . Urinary incontinence   . Mental retardation   . Schizophrenia   . BPH (benign prostatic hyperplasia)   . Thyroid disease   . Anemia   . Cataract   . Hypertension     DENIES    History   Social History  . Marital Status: Single    Spouse Name: N/A  . Number of Children: N/A  . Years of Education: N/A   Occupational History  . Not on file.   Social History Main Topics  . Smoking status: Never Smoker   . Smokeless tobacco: Current User    Types: Chew     Comment: chews tobacco  . Alcohol Use: No  . Drug  Use: No  . Sexual Activity: Not on file   Other Topics Concern  . Not on file   Social History Narrative   Lives in a group home   Sister [name] is his legal guardian.   Sister and her family live locally    Past Surgical History  Procedure Laterality Date  . Prostate surgery      x2, ?procedure    Family History  Problem Relation Age of Onset  . Arthritis Mother   . Arrhythmia Mother   . Lymphoma Mother     non-hodgkins  . Kidney disease Mother   . Heart disease Father   . Stroke Father     No Known Allergies  Current Outpatient Prescriptions on File Prior to Visit  Medication Sig Dispense Refill  . Acetaminophen (ACETAMINOPHEN EXTRA STRENGTH) 167 MG/5ML LIQD Take 15 mLs by mouth.    Marland Kitchen aspirin 81 MG tablet Take 81 mg by mouth daily.    . clotrimazole-betamethasone (LOTRISONE) cream Apply 1 application topically 2 (two) times daily. 45 g 1  . Control Gel Formula Dressing (DUODERM CGF DRESSING) MISC Apply 1 each topically every 3 (three) days. 20 each 0  . desonide (DESOWEN) 0.05 % lotion Apply 1 application topically 3 (three) times a week.    . diphenhydrAMINE (BENADRYL) 25 mg capsule Take 2 capsules (50 mg total) by mouth at bedtime. 60 capsule 3  . dutasteride (AVODART) 0.5 MG capsule TAKE ONE  CAPSULE BY MOUTH EVERY DAY 30 capsule 5  . Emollient (CERAVE) CREA Apply 1 application topically. For dry skin on feet and legs    . ferrous sulfate 325 (65 FE) MG tablet Take 1 tablet (325 mg total) by mouth 2 (two) times daily with a meal. 60 tablet 5  . FLUoxetine (PROZAC) 20 MG capsule Take 10 mg by mouth daily.     Marland Kitchen ketoconazole (NIZORAL) 2 % cream Apply 1 application topically 2 (two) times daily. For face    . levothyroxine (SYNTHROID, LEVOTHROID) 100 MCG tablet Take 1 tablet (100 mcg total) by mouth daily. 30 tablet 2  . lip balm (BLISTEX) OINT Apply 1 application topically 4 (four) times daily as needed for lip care.    . Loperamide HCl (IMODIUM A-D PO) Take by mouth  as needed.    . Nutritional Supplements (ENSURE ACTIVE) LIQD Take 1 Can by mouth 3 (three) times daily. 90 Bottle 2  . polyethylene glycol powder (GLYCOLAX/MIRALAX) powder 1 packet in 8 oz of water or juice once daily AS NEEDED for constipation 3350 g 1  . QUEtiapine (SEROQUEL) 50 MG tablet Take 50 mg by mouth 2 (two) times daily.    . Simethicone (MYLANTA GAS PO) Take by mouth as needed.    . tamsulosin (FLOMAX) 0.4 MG CAPS capsule TAKE ONE CAPSULE BY MOUTH AT BEDTIME 30 capsule 5  . thioridazine (MELLARIL) 100 MG tablet Take 200 mg by mouth at bedtime.     Marland Kitchen thioridazine (MELLARIL) 50 MG tablet Take 1 tablet (50 mg total) by mouth every morning. 30 tablet 10   No current facility-administered medications on file prior to visit.    BP 124/67 mmHg  Pulse 85  Temp(Src) 98.1 F (36.7 C) (Oral)  Ht 5' 8.5" (1.74 m)  Wt 170 lb 9.6 oz (77.384 kg)  BMI 25.56 kg/m2  SpO2 98%       Objective:   Physical Exam   General Appearance- Not in acute distress.Poor historian. Sister states he has MR  HEENT Eyes- Scleraeral/Conjuntiva-bilat- Not Yellow. Mouth & Throat- Normal.  Chest and Lung Exam Auscultation: Breath sounds:-Normal. Adventitious sounds:- No Adventitious sounds.  Cardiovascular Auscultation:Rythm - Regular. Heart Sounds -Normal heart sounds.  Abdomen Inspection:-Inspection Normal.  Palpation/Perucssion: Palpation and Percussion of the abdomen reveal- Non Tender, No Rebound tenderness, No rigidity(Guarding) and No Palpable abdominal masses.  Liver:-Normal.  Spleen:- Normal.   Rectal Anorectal Exam: Stool -  External - normal external exam. Internal - normal sphincter tone. Large soft bm deep in rectal vault. I could not clear this. Heme negative stool     Assessment & Plan:

## 2014-12-27 ENCOUNTER — Other Ambulatory Visit (INDEPENDENT_AMBULATORY_CARE_PROVIDER_SITE_OTHER): Payer: Medicare Other

## 2014-12-27 ENCOUNTER — Telehealth: Payer: Self-pay

## 2014-12-27 ENCOUNTER — Telehealth: Payer: Self-pay | Admitting: Medical

## 2014-12-27 ENCOUNTER — Ambulatory Visit (HOSPITAL_BASED_OUTPATIENT_CLINIC_OR_DEPARTMENT_OTHER)
Admission: RE | Admit: 2014-12-27 | Discharge: 2014-12-27 | Disposition: A | Discharge status: Home / Self Care | Payer: Medicare Other | Source: Ambulatory Visit | Attending: Medical | Admitting: Medical

## 2014-12-27 ENCOUNTER — Telehealth: Payer: Self-pay | Admitting: Family

## 2014-12-27 DIAGNOSIS — K5900 Constipation, unspecified: Secondary | ICD-10-CM

## 2014-12-27 DIAGNOSIS — K6389 Other specified diseases of intestine: Secondary | ICD-10-CM | POA: Insufficient documentation

## 2014-12-27 LAB — COMPREHENSIVE METABOLIC PANEL
ALK PHOS: 131 U/L — AB (ref 39–117)
ALT: 16 U/L (ref 0–53)
AST: 20 U/L (ref 0–37)
Albumin: 3.7 g/dL (ref 3.5–5.2)
BUN: 19 mg/dL (ref 6–23)
CALCIUM: 8.9 mg/dL (ref 8.4–10.5)
CO2: 33 mEq/L — ABNORMAL HIGH (ref 19–32)
Chloride: 100 mEq/L (ref 96–112)
Creatinine, Ser: 0.94 mg/dL (ref 0.40–1.50)
GFR: 84.01 mL/min (ref >60.00)
Glucose, Bld: 167 mg/dL — ABNORMAL HIGH (ref 70–99)
Potassium: 4.2 mEq/L (ref 3.5–5.1)
Sodium: 137 mEq/L (ref 135–145)
TOTAL PROTEIN: 7.3 g/dL (ref 6.0–8.3)
Total Bilirubin: 0.5 mg/dL (ref 0.2–1.2)

## 2014-12-27 LAB — CBC WITH DIFFERENTIAL/PLATELET
BASOS ABS: 0.1 10*3/uL (ref 0.0–0.1)
BASOS PCT: 0.4 % (ref 0.0–3.0)
Eosinophils Absolute: 0.9 10*3/uL — ABNORMAL HIGH (ref 0.0–0.7)
Eosinophils Relative: 7.3 % — ABNORMAL HIGH (ref 0.0–5.0)
HCT: 41.1 % (ref 39.0–52.0)
Hemoglobin: 13.7 g/dL (ref 13.0–17.0)
LYMPHS ABS: 1.5 10*3/uL (ref 0.7–4.0)
Lymphocytes Relative: 12.8 % (ref 12.0–46.0)
MCHC: 33.3 g/dL (ref 30.0–36.0)
MCV: 97.8 fl (ref 78.0–100.0)
Monocytes Absolute: 0.9 10*3/uL (ref 0.1–1.0)
Monocytes Relative: 7.3 % (ref 3.0–12.0)
NEUTROS ABS: 8.8 10*3/uL — AB (ref 1.4–7.7)
Neutrophils Relative %: 72.2 % (ref 43.0–77.0)
Platelets: 277 10*3/uL (ref 150.0–400.0)
RBC: 4.2 Mil/uL — ABNORMAL LOW (ref 4.22–5.81)
RDW: 14.3 % (ref 11.5–15.5)
WBC: 12.1 10*3/uL — ABNORMAL HIGH (ref 4.0–10.5)

## 2014-12-27 MED ORDER — CIPROFLOXACIN HCL 250 MG PO TABS: 250.0000 mg | ORAL_TABLET | Freq: Two times a day (BID) | ORAL | Status: DC

## 2014-12-27 NOTE — Telephone Encounter (Signed)
Caller name: [name] at [facility] Relation to pt: Call back number: [phone] Pharmacy:  Reason for call:   Please call Edward [name]. He has questions regarding cipro rx

## 2014-12-27 NOTE — Telephone Encounter (Signed)
That is fine. Can hold iron. This may help him with bm as well.

## 2014-12-27 NOTE — Telephone Encounter (Signed)
Nursing home facility called to advise that Cipro has a decreased absorption rate while patient is taking iron.  Requested "hold order" on iron until the patient has completed abx. Advised ok.  Do you agree with plan of care?

## 2014-12-27 NOTE — Telephone Encounter (Signed)
Pt did feel better last night and today (talked to sister). He ate last night.This am had large bm. Later ate today in afternoon ate. His wbc was mild elevated on lab today. Xray showed stool present in rectum and mild distension. I advised sister to use miralax again today since last used yesterday. If he worsens over weekend then ED evaluation. Asked sister to make appointment with me on Monday.

## 2014-12-30 ENCOUNTER — Encounter: Payer: Self-pay | Admitting: Medical

## 2014-12-30 ENCOUNTER — Ambulatory Visit (INDEPENDENT_AMBULATORY_CARE_PROVIDER_SITE_OTHER): Payer: Medicare Other | Admitting: Medical

## 2014-12-30 VITALS — BP 114/72 | HR 90 | Temp 97.6°F | Ht 68.5 in | Wt 165.8 lb

## 2014-12-30 DIAGNOSIS — I10 Essential (primary) hypertension: Secondary | ICD-10-CM | POA: Diagnosis not present

## 2014-12-30 DIAGNOSIS — E119 Type 2 diabetes mellitus without complications: Secondary | ICD-10-CM | POA: Diagnosis not present

## 2014-12-30 DIAGNOSIS — D72829 Elevated white blood cell count, unspecified: Secondary | ICD-10-CM | POA: Diagnosis not present

## 2014-12-30 DIAGNOSIS — F209 Schizophrenia, unspecified: Secondary | ICD-10-CM | POA: Diagnosis not present

## 2014-12-30 DIAGNOSIS — K5909 Other constipation: Secondary | ICD-10-CM

## 2014-12-30 DIAGNOSIS — R1032 Left lower quadrant pain: Secondary | ICD-10-CM | POA: Diagnosis not present

## 2014-12-30 DIAGNOSIS — L89152 Pressure ulcer of sacral region, stage 2: Secondary | ICD-10-CM | POA: Diagnosis not present

## 2014-12-30 DIAGNOSIS — R1310 Dysphagia, unspecified: Secondary | ICD-10-CM | POA: Diagnosis not present

## 2014-12-30 LAB — CBC WITH DIFFERENTIAL/PLATELET
BASOS ABS: 0 10*3/uL (ref 0.0–0.1)
Basophils Relative: 0.5 % (ref 0.0–3.0)
Eosinophils Absolute: 0.5 10*3/uL (ref 0.0–0.7)
Eosinophils Relative: 5.8 % — ABNORMAL HIGH (ref 0.0–5.0)
HCT: 40.8 % (ref 39.0–52.0)
Hemoglobin: 13.4 g/dL (ref 13.0–17.0)
Lymphocytes Relative: 13.6 % (ref 12.0–46.0)
Lymphs Abs: 1.2 10*3/uL (ref 0.7–4.0)
MCHC: 32.9 g/dL (ref 30.0–36.0)
MCV: 98.8 fl (ref 78.0–100.0)
MONOS PCT: 5.8 % (ref 3.0–12.0)
Monocytes Absolute: 0.5 10*3/uL (ref 0.1–1.0)
Neutro Abs: 6.6 10*3/uL (ref 1.4–7.7)
Neutrophils Relative %: 74.3 % (ref 43.0–77.0)
PLATELETS: 303 10*3/uL (ref 150.0–400.0)
RBC: 4.14 Mil/uL — ABNORMAL LOW (ref 4.22–5.81)
RDW: 14.2 % (ref 11.5–15.5)
WBC: 8.9 10*3/uL (ref 4.0–10.5)

## 2014-12-30 NOTE — Patient Instructions (Signed)
Constipation By history and clinically resolved. Will get repeat cbc since he did have mild elevate white count last week.  I will write order clarifying. miralax standing order to use max one time daily if pt were to complain of constipation. If pt has no complaint but by 3 days has not had any bm advised to use miralax as well.  Will get lab and check hb/hct today. May just use iron 1 tab every other day. This may prevent constiation.  Follow up in 3 wks or as needed

## 2014-12-30 NOTE — Assessment & Plan Note (Signed)
By history and clinically resolved. Will get repeat cbc since he did have mild elevate white count last week.  I will write order clarifying. miralax standing order to use max one time daily if pt were to complain of constipation. If pt has no complaint but by 3 days has not had any bm advised to use miralax as well.  Will get lab and check hb/hct today. May just use iron 1 tab every other day. This may prevent constiation.  Follow up in 3 wks or as needed

## 2014-12-30 NOTE — Telephone Encounter (Signed)
Spoke with Jiles Prows, he states that pt usually gets Cipro when pt sees his urologist and he was concerned that pt's body is already used to that med and he may need something else. He denies current UTI symptoms. Advised him per verbal from PA, [place] that Rx was given to pt as his WBC was elevated on Friday and he wanted to make sure pt was covered for the upcoming weekend. Pt has f/u in our office today and need for further Rx can be discussed at that time. He voices understanding.

## 2014-12-30 NOTE — Progress Notes (Signed)
Subjective:    Patient ID: Edward [name], male    DOB: 1946/06/17, 69 y.o.   MRN: [id number]  HPI   Pt in with feeling well. NO complaints. He states he got over his prior constipation. He has been having bm every day. Pt states bm last night was a large bm. No fevers, or chills. Pt has daily bowel movements since last saw him. Lost 5 pounds since last visit. Ver large bm yesterday.  Pt has prn order for miralax.  Pt was on iron. His prior labs looked pretty good. Not real low hb/hct.   Wbc mild elevated last time and only gave 3 days low dose cipro pending follow up today.   Pt stays at [phone].   Review of Systems  Constitutional: Negative for fever, chills and fatigue.  Respiratory: Negative for cough, choking and wheezing.   Cardiovascular: Negative for chest pain and palpitations.  Gastrointestinal: Negative for nausea, vomiting, abdominal pain, diarrhea, constipation, blood in stool, abdominal distention, anal bleeding and rectal pain.  Genitourinary: Negative.   Musculoskeletal: Negative for back pain.  Skin: Negative for rash.  Neurological: Negative for dizziness, syncope, speech difficulty, weakness, light-headedness and numbness.  Hematological: Negative for adenopathy.  Psychiatric/Behavioral: Negative for confusion and sleep disturbance.   Past Medical History  Diagnosis Date  . Diabetes mellitus   . Urinary incontinence   . Mental retardation   . Schizophrenia   . BPH (benign prostatic hyperplasia)   . Thyroid disease   . Anemia   . Cataract   . Hypertension     DENIES    History   Social History  . Marital Status: Single    Spouse Name: N/A  . Number of Children: N/A  . Years of Education: N/A   Occupational History  . Not on file.   Social History Main Topics  . Smoking status: Never Smoker   . Smokeless tobacco: Current User    Types: Chew     Comment: chews tobacco  . Alcohol Use: No  . Drug Use: No  . Sexual  Activity: Not on file   Other Topics Concern  . Not on file   Social History Narrative   Lives in a group home   Sister [name] is his legal guardian.   Sister and her family live locally    Past Surgical History  Procedure Laterality Date  . Prostate surgery      x2, ?procedure    Family History  Problem Relation Age of Onset  . Arthritis Mother   . Arrhythmia Mother   . Lymphoma Mother     non-hodgkins  . Kidney disease Mother   . Heart disease Father   . Stroke Father     No Known Allergies  Current Outpatient Prescriptions on File Prior to Visit  Medication Sig Dispense Refill  . Acetaminophen (ACETAMINOPHEN EXTRA STRENGTH) 167 MG/5ML LIQD Take 15 mLs by mouth.    Marland Kitchen aspirin 81 MG tablet Take 81 mg by mouth daily.    . ciprofloxacin (CIPRO) 250 MG tablet Take 1 tablet (250 mg total) by mouth 2 (two) times daily. 6 tablet 0  . clotrimazole-betamethasone (LOTRISONE) cream Apply 1 application topically 2 (two) times daily. 45 g 1  . Control Gel Formula Dressing (DUODERM CGF DRESSING) MISC Apply 1 each topically every 3 (three) days. 20 each 0  . desonide (DESOWEN) 0.05 % lotion Apply 1 application topically 3 (three) times a week.    . diphenhydrAMINE (  BENADRYL) 25 mg capsule Take 2 capsules (50 mg total) by mouth at bedtime. 60 capsule 3  . dutasteride (AVODART) 0.5 MG capsule TAKE ONE CAPSULE BY MOUTH EVERY DAY 30 capsule 5  . Emollient (CERAVE) CREA Apply 1 application topically. For dry skin on feet and legs    . ferrous sulfate 325 (65 FE) MG tablet Take 1 tablet (325 mg total) by mouth 2 (two) times daily with a meal. 60 tablet 5  . FLUoxetine (PROZAC) 20 MG capsule Take 10 mg by mouth daily.     Marland Kitchen ketoconazole (NIZORAL) 2 % cream Apply 1 application topically 2 (two) times daily. For face    . levothyroxine (SYNTHROID, LEVOTHROID) 100 MCG tablet Take 1 tablet (100 mcg total) by mouth daily. 30 tablet 2  . lip balm (BLISTEX) OINT Apply 1 application topically 4  (four) times daily as needed for lip care.    . Loperamide HCl (IMODIUM A-D PO) Take by mouth as needed.    . Nutritional Supplements (ENSURE ACTIVE) LIQD Take 1 Can by mouth 3 (three) times daily. 90 Bottle 2  . polyethylene glycol powder (GLYCOLAX/MIRALAX) powder 1 packet in 8 oz of water or juice once daily AS NEEDED for constipation 3350 g 1  . QUEtiapine (SEROQUEL) 50 MG tablet Take 50 mg by mouth 2 (two) times daily.    . Simethicone (MYLANTA GAS PO) Take by mouth as needed.    . tamsulosin (FLOMAX) 0.4 MG CAPS capsule TAKE ONE CAPSULE BY MOUTH AT BEDTIME 30 capsule 5  . thioridazine (MELLARIL) 100 MG tablet Take 200 mg by mouth at bedtime.     Marland Kitchen thioridazine (MELLARIL) 50 MG tablet Take 1 tablet (50 mg total) by mouth every morning. 30 tablet 10   No current facility-administered medications on file prior to visit.    BP 114/72 mmHg  Pulse 90  Temp(Src) 97.6 F (36.4 C) (Oral)  Ht 5' 8.5" (1.74 m)  Wt 165 lb 12.8 oz (75.206 kg)  BMI 24.84 kg/m2  SpO2 96%       Objective:   Physical Exam  General Appearance- Not in acute distress.  HEENT Eyes- Scleraeral/Conjuntiva-bilat- Not Yellow. Mouth & Throat- Normal.  Chest and Lung Exam Auscultation: Breath sounds:-Normal. Adventitious sounds:- No Adventitious sounds.  Cardiovascular Auscultation:Rythm - Regular. Heart Sounds -Normal heart sounds.  Abdomen Inspection:-Inspection Normal.  Palpation/Perucssion: Palpation and Percussion of the abdomen reveal- Non Tender, No Rebound tenderness, No rigidity(Guarding) and No Palpable abdominal masses.  Liver:-Normal.  Spleen:- Normal.         Assessment & Plan:

## 2014-12-30 NOTE — Progress Notes (Signed)
Pre visit review using our clinic review tool, if applicable. No additional management support is needed unless otherwise documented below in the visit note. 

## 2014-12-31 ENCOUNTER — Ambulatory Visit: Payer: Self-pay | Admitting: Gastroenterology

## 2015-01-03 ENCOUNTER — Other Ambulatory Visit: Payer: Self-pay

## 2015-01-09 DIAGNOSIS — F209 Schizophrenia, unspecified: Secondary | ICD-10-CM | POA: Diagnosis not present

## 2015-01-09 DIAGNOSIS — L89152 Pressure ulcer of sacral region, stage 2: Secondary | ICD-10-CM | POA: Diagnosis not present

## 2015-01-09 DIAGNOSIS — R1032 Left lower quadrant pain: Secondary | ICD-10-CM | POA: Diagnosis not present

## 2015-01-09 DIAGNOSIS — E119 Type 2 diabetes mellitus without complications: Secondary | ICD-10-CM | POA: Diagnosis not present

## 2015-01-09 DIAGNOSIS — I10 Essential (primary) hypertension: Secondary | ICD-10-CM | POA: Diagnosis not present

## 2015-01-09 DIAGNOSIS — R1310 Dysphagia, unspecified: Secondary | ICD-10-CM | POA: Diagnosis not present

## 2015-01-14 ENCOUNTER — Ambulatory Visit: Payer: Medicare Other

## 2015-01-14 DIAGNOSIS — R1032 Left lower quadrant pain: Secondary | ICD-10-CM | POA: Diagnosis not present

## 2015-01-14 DIAGNOSIS — I10 Essential (primary) hypertension: Secondary | ICD-10-CM | POA: Diagnosis not present

## 2015-01-14 DIAGNOSIS — F209 Schizophrenia, unspecified: Secondary | ICD-10-CM | POA: Diagnosis not present

## 2015-01-14 DIAGNOSIS — L89152 Pressure ulcer of sacral region, stage 2: Secondary | ICD-10-CM | POA: Diagnosis not present

## 2015-01-14 DIAGNOSIS — E119 Type 2 diabetes mellitus without complications: Secondary | ICD-10-CM | POA: Diagnosis not present

## 2015-01-14 DIAGNOSIS — R1310 Dysphagia, unspecified: Secondary | ICD-10-CM | POA: Diagnosis not present

## 2015-01-15 ENCOUNTER — Ambulatory Visit (INDEPENDENT_AMBULATORY_CARE_PROVIDER_SITE_OTHER): Payer: Medicare Other | Admitting: Medical

## 2015-01-15 ENCOUNTER — Encounter: Payer: Self-pay | Admitting: Medical

## 2015-01-15 VITALS — BP 115/72 | HR 83 | Temp 97.9°F | Ht 68.5 in | Wt 165.8 lb

## 2015-01-15 DIAGNOSIS — K5909 Other constipation: Secondary | ICD-10-CM

## 2015-01-15 DIAGNOSIS — R339 Retention of urine, unspecified: Secondary | ICD-10-CM | POA: Diagnosis not present

## 2015-01-15 DIAGNOSIS — E119 Type 2 diabetes mellitus without complications: Secondary | ICD-10-CM

## 2015-01-15 LAB — COMPREHENSIVE METABOLIC PANEL
ALK PHOS: 113 U/L (ref 39–117)
ALT: 11 U/L (ref 0–53)
AST: 17 U/L (ref 0–37)
Albumin: 3.6 g/dL (ref 3.5–5.2)
BUN: 25 mg/dL — AB (ref 6–23)
CO2: 31 meq/L (ref 19–32)
CREATININE: 0.99 mg/dL (ref 0.40–1.50)
Calcium: 9 mg/dL (ref 8.4–10.5)
Chloride: 102 mEq/L (ref 96–112)
GFR: 79.12 mL/min (ref >60.00)
Glucose, Bld: 127 mg/dL — ABNORMAL HIGH (ref 70–99)
POTASSIUM: 4.4 meq/L (ref 3.5–5.1)
Sodium: 137 mEq/L (ref 135–145)
Total Bilirubin: 0.3 mg/dL (ref 0.2–1.2)
Total Protein: 7.3 g/dL (ref 6.0–8.3)

## 2015-01-15 LAB — HEMOGLOBIN A1C: Hgb A1c MFr Bld: 5.1 % (ref 4.6–6.5)

## 2015-01-15 NOTE — Progress Notes (Signed)
Pre visit review using our clinic review tool, if applicable. No additional management support is needed unless otherwise documented below in the visit note. 

## 2015-01-15 NOTE — Progress Notes (Signed)
Subjective:    Patient ID: Edward [name], male    DOB: Dec 14, 1945, 69 y.o.   MRN: [id number]  HPI  Pt in for follow up. Pt bowel pattern per care taker in home. He is going twice a day. Good form. Not loose. No fever, no chills and no abdomen pain. Using miralax every other day. No abdominal or rectal area pain.  Pt is in with his sister and caretaker at his home.   Review of Systems  Constitutional: Negative for fever, chills, diaphoresis and fatigue.  Respiratory: Negative for cough, choking, chest tightness, shortness of breath and wheezing.   Cardiovascular: Negative for chest pain and palpitations.  Gastrointestinal: Negative for abdominal pain, diarrhea, constipation, blood in stool, abdominal distention and anal bleeding.  Musculoskeletal: Negative for back pain.  Neurological: Negative for dizziness, seizures, weakness and headaches.  Hematological: Negative for adenopathy. Does not bruise/bleed easily.  Psychiatric/Behavioral: Negative for behavioral problems and confusion.     Past Medical History  Diagnosis Date  . Diabetes mellitus   . Urinary incontinence   . Mental retardation   . Schizophrenia   . BPH (benign prostatic hyperplasia)   . Thyroid disease   . Anemia   . Cataract   . Hypertension     DENIES    History   Social History  . Marital Status: Single    Spouse Name: N/A  . Number of Children: N/A  . Years of Education: N/A   Occupational History  . Not on file.   Social History Main Topics  . Smoking status: Never Smoker   . Smokeless tobacco: Current User    Types: Chew     Comment: chews tobacco  . Alcohol Use: No  . Drug Use: No  . Sexual Activity: Not on file   Other Topics Concern  . Not on file   Social History Narrative   Lives in a group home   Sister [name] is his legal guardian.   Sister and her family live locally    Past Surgical History  Procedure Laterality Date  . Prostate surgery      x2, ?procedure     Family History  Problem Relation Age of Onset  . Arthritis Mother   . Arrhythmia Mother   . Lymphoma Mother     non-hodgkins  . Kidney disease Mother   . Heart disease Father   . Stroke Father     No Known Allergies  Current Outpatient Prescriptions on File Prior to Visit  Medication Sig Dispense Refill  . Acetaminophen (ACETAMINOPHEN EXTRA STRENGTH) 167 MG/5ML LIQD Take 15 mLs by mouth.    Marland Kitchen aspirin 81 MG tablet Take 81 mg by mouth daily.    . clotrimazole-betamethasone (LOTRISONE) cream Apply 1 application topically 2 (two) times daily. 45 g 1  . Control Gel Formula Dressing (DUODERM CGF DRESSING) MISC Apply 1 each topically every 3 (three) days. 20 each 0  . desonide (DESOWEN) 0.05 % lotion Apply 1 application topically 3 (three) times a week.    . diphenhydrAMINE (BENADRYL) 25 mg capsule Take 2 capsules (50 mg total) by mouth at bedtime. 60 capsule 3  . dutasteride (AVODART) 0.5 MG capsule TAKE ONE CAPSULE BY MOUTH EVERY DAY 30 capsule 5  . Emollient (CERAVE) CREA Apply 1 application topically. For dry skin on feet and legs    . ferrous sulfate 325 (65 FE) MG tablet Take 1 tablet (325 mg total) by mouth 2 (two) times daily with a meal. 60  tablet 5  . FLUoxetine (PROZAC) 20 MG capsule Take 10 mg by mouth daily.     Marland Kitchen ketoconazole (NIZORAL) 2 % cream Apply 1 application topically 2 (two) times daily. For face    . levothyroxine (SYNTHROID, LEVOTHROID) 100 MCG tablet Take 1 tablet (100 mcg total) by mouth daily. 30 tablet 2  . Loperamide HCl (IMODIUM A-D PO) Take by mouth as needed.    . Nutritional Supplements (ENSURE ACTIVE) LIQD Take 1 Can by mouth 3 (three) times daily. 90 Bottle 2  . polyethylene glycol powder (GLYCOLAX/MIRALAX) powder 1 packet in 8 oz of water or juice once daily AS NEEDED for constipation 3350 g 1  . QUEtiapine (SEROQUEL) 50 MG tablet Take 50 mg by mouth 2 (two) times daily.    . Simethicone (MYLANTA GAS PO) Take by mouth as needed.    . tamsulosin  (FLOMAX) 0.4 MG CAPS capsule TAKE ONE CAPSULE BY MOUTH AT BEDTIME 30 capsule 5  . thioridazine (MELLARIL) 100 MG tablet Take 200 mg by mouth at bedtime.     Marland Kitchen thioridazine (MELLARIL) 50 MG tablet Take 1 tablet (50 mg total) by mouth every morning. 30 tablet 10  . lip balm (BLISTEX) OINT Apply 1 application topically 4 (four) times daily as needed for lip care.     No current facility-administered medications on file prior to visit.    BP 115/72 mmHg  Pulse 83  Temp(Src) 97.9 F (36.6 C) (Oral)  Ht 5' 8.5" (1.74 m)  Wt 165 lb 12.8 oz (75.206 kg)  BMI 24.84 kg/m2  SpO2 98%       Objective:   Physical Exam  General Appearance- Not in acute distress.  HEENT Eyes- Scleraeral/Conjuntiva-bilat- Not Yellow. Mouth & Throat- Normal.  Chest and Lung Exam Auscultation: Breath sounds:-Normal. CTA. Adventitious sounds:- No Adventitious sounds.  Cardiovascular Auscultation:Rythm - Regular, Rate and Rythm. Heart Sounds -Normal heart sounds.  Abdomen Inspection:-Inspection Normal.  Palpation/Perucssion: Palpation and Percussion of the abdomen reveal- Non Tender and nondistended, No Rebound tenderness, No rigidity(Guarding) and No Palpable abdominal masses.  Liver:-Normal.  Spleen:- Normal.         Assessment & Plan:

## 2015-01-15 NOTE — Patient Instructions (Addendum)
Constipation Doing well now. Could go back to cup of prune juice a day. And can do miralax one time every 3rd day if bm exceed two times a day. For the present can continue miralax every other day.   Per sister GI MD did not recommend any colonsocpy in the past.  Diabetes mellitus without complication Pt in for other issue but did see that has been some time since a1-c done. So will order cmp and a1-c today.      Follow up as regularly scheduled with pcp or as needed.

## 2015-01-15 NOTE — Assessment & Plan Note (Signed)
Pt in for other issue but did see that has been some time since a1-c done. So will order cmp and a1-c today.

## 2015-01-15 NOTE — Assessment & Plan Note (Signed)
Doing well now. Could go back to cup of prune juice a day. And can do miralax one time every 3rd day if bm exceed two times a day. For the present can continue miralax every other day.   Per sister GI MD did not recommend any colonsocpy in the past.

## 2015-01-16 ENCOUNTER — Ambulatory Visit: Payer: Medicare Other | Admitting: Podiatry

## 2015-01-27 DIAGNOSIS — R1310 Dysphagia, unspecified: Secondary | ICD-10-CM | POA: Diagnosis not present

## 2015-01-27 DIAGNOSIS — I10 Essential (primary) hypertension: Secondary | ICD-10-CM | POA: Diagnosis not present

## 2015-01-27 DIAGNOSIS — L89152 Pressure ulcer of sacral region, stage 2: Secondary | ICD-10-CM | POA: Diagnosis not present

## 2015-01-27 DIAGNOSIS — E119 Type 2 diabetes mellitus without complications: Secondary | ICD-10-CM | POA: Diagnosis not present

## 2015-01-27 DIAGNOSIS — R1032 Left lower quadrant pain: Secondary | ICD-10-CM | POA: Diagnosis not present

## 2015-01-27 DIAGNOSIS — F209 Schizophrenia, unspecified: Secondary | ICD-10-CM | POA: Diagnosis not present

## 2015-02-03 ENCOUNTER — Encounter: Payer: Self-pay | Admitting: Family

## 2015-02-03 ENCOUNTER — Ambulatory Visit (INDEPENDENT_AMBULATORY_CARE_PROVIDER_SITE_OTHER): Payer: Medicare Other | Admitting: Family

## 2015-02-03 VITALS — BP 110/60 | HR 75 | Temp 98.4°F | Resp 16 | Ht 68.5 in | Wt 166.2 lb

## 2015-02-03 DIAGNOSIS — R634 Abnormal weight loss: Secondary | ICD-10-CM | POA: Diagnosis not present

## 2015-02-03 DIAGNOSIS — L02229 Furuncle of trunk, unspecified: Secondary | ICD-10-CM

## 2015-02-03 DIAGNOSIS — K5909 Other constipation: Secondary | ICD-10-CM

## 2015-02-03 MED ORDER — DUTASTERIDE 0.5 MG PO CAPS: 0.5000 mg | ORAL_CAPSULE | Freq: Every day | ORAL | Status: AC

## 2015-02-03 MED ORDER — LEVOTHYROXINE SODIUM 100 MCG PO TABS: 100.0000 ug | ORAL_TABLET | Freq: Every day | ORAL | Status: AC

## 2015-02-03 MED ORDER — DIPHENHYDRAMINE HCL 25 MG PO CAPS: 50.0000 mg | ORAL_CAPSULE | Freq: Every evening | ORAL | Status: DC | PRN

## 2015-02-03 MED ORDER — DOXYCYCLINE HYCLATE 100 MG PO TABS: 100.0000 mg | ORAL_TABLET | Freq: Two times a day (BID) | ORAL | Status: DC

## 2015-02-03 MED ORDER — TAMSULOSIN HCL 0.4 MG PO CAPS
0.4000 mg | ORAL_CAPSULE | Freq: Every day | ORAL | Status: DC
Start: 2015-02-03 — End: 2015-07-31

## 2015-02-03 NOTE — Progress Notes (Signed)
Pre visit review using our clinic review tool, if applicable. No additional management support is needed unless otherwise documented below in the visit note. 

## 2015-02-03 NOTE — Patient Instructions (Signed)
OK to switch to [employer] brand of Ensure. Start doxycycline for boil. Follow up in 1 week. Call if increased pain/redness/swelling or if fever >101.

## 2015-02-03 NOTE — Progress Notes (Signed)
Subjective:    Patient ID: Edward [name], male    DOB: [birth year], 69 y.o.   MRN: [id number]  HPI  Edward [name] presents today with his sister for follow up of his weight loss.  It seems that he was not receiving enough calories at the home where he was living. He has since been moved to a different facility with awake staff overnight.  He is reportedly eating better and very happy at his new home.    Wt Readings from Last 3 Encounters:  02/03/15 166 lb 3.2 oz (75.388 kg)  01/15/15 165 lb 12.8 oz (75.206 kg)  12/30/14 165 lb 12.8 oz (75.206 kg)   Pt was seen a few weeks back.  A1C was 5.1 at that visit.  Reports that constipation is stable.  Using miralax every other day. No runny stools.  He has improved his food intake.    Patient is concerned about tender boil on his right upper abdomen.   Review of Systems See HPI  Past Medical History  Diagnosis Date  . Diabetes mellitus   . Urinary incontinence   . Mental retardation   . Schizophrenia   . BPH (benign prostatic hyperplasia)   . Thyroid disease   . Anemia   . Cataract   . Hypertension     DENIES    History   Social History  . Marital Status: Single    Spouse Name: N/A  . Number of Children: N/A  . Years of Education: N/A   Occupational History  . Not on file.   Social History Main Topics  . Smoking status: Never Smoker   . Smokeless tobacco: Current User    Types: Chew     Comment: chews tobacco  . Alcohol Use: No  . Drug Use: No  . Sexual Activity: Not on file   Other Topics Concern  . Not on file   Social History Narrative   Lives in a group home   Sister [name] is his legal guardian.   Sister and her family live locally    Past Surgical History  Procedure Laterality Date  . Prostate surgery      x2, ?procedure    Family History  Problem Relation Age of Onset  . Arthritis Mother   . Arrhythmia Mother   . Lymphoma Mother     non-hodgkins  . Kidney disease Mother   . Heart disease Father    . Stroke Father     No Known Allergies  Current Outpatient Prescriptions on File Prior to Visit  Medication Sig Dispense Refill  . Acetaminophen (ACETAMINOPHEN EXTRA STRENGTH) 167 MG/5ML LIQD Take 15 mLs by mouth.    Marland Kitchen aspirin 81 MG tablet Take 81 mg by mouth daily.    . clotrimazole-betamethasone (LOTRISONE) cream Apply 1 application topically 2 (two) times daily. 45 g 1  . Control Gel Formula Dressing (DUODERM CGF DRESSING) MISC Apply 1 each topically every 3 (three) days. 20 each 0  . desonide (DESOWEN) 0.05 % lotion Apply 1 application topically 3 (three) times a week.    . diphenhydrAMINE (BENADRYL) 25 mg capsule Take 2 capsules (50 mg total) by mouth at bedtime. 60 capsule 3  . dutasteride (AVODART) 0.5 MG capsule TAKE ONE CAPSULE BY MOUTH EVERY DAY 30 capsule 5  . Emollient (CERAVE) CREA Apply 1 application topically. For dry skin on feet and legs    . ferrous sulfate 325 (65 FE) MG tablet Take 1 tablet (325 mg total) by mouth 2 (  two) times daily with a meal. 60 tablet 5  . FLUoxetine (PROZAC) 20 MG capsule Take 10 mg by mouth daily.     Marland Kitchen ketoconazole (NIZORAL) 2 % cream Apply 1 application topically 2 (two) times daily. For face    . levothyroxine (SYNTHROID, LEVOTHROID) 100 MCG tablet Take 1 tablet (100 mcg total) by mouth daily. 30 tablet 2  . lip balm (BLISTEX) OINT Apply 1 application topically 4 (four) times daily as needed for lip care.    . Loperamide HCl (IMODIUM A-D PO) Take by mouth as needed.    . Nutritional Supplements (ENSURE ACTIVE) LIQD Take 1 Can by mouth 3 (three) times daily. 90 Bottle 2  . polyethylene glycol powder (GLYCOLAX/MIRALAX) powder 1 packet in 8 oz of water or juice once daily AS NEEDED for constipation 3350 g 1  . QUEtiapine (SEROQUEL) 50 MG tablet Take 50 mg by mouth 2 (two) times daily.    . Simethicone (MYLANTA GAS PO) Take by mouth as needed.    . tamsulosin (FLOMAX) 0.4 MG CAPS capsule TAKE ONE CAPSULE BY MOUTH AT BEDTIME 30 capsule 5  .  thioridazine (MELLARIL) 100 MG tablet Take 200 mg by mouth at bedtime.     Marland Kitchen thioridazine (MELLARIL) 50 MG tablet Take 1 tablet (50 mg total) by mouth every morning. 30 tablet 10   No current facility-administered medications on file prior to visit.    BP 110/60 mmHg  Pulse 75  Temp(Src) 98.4 F (36.9 C) (Oral)  Resp 16  Ht 5' 8.5" (1.74 m)  Wt 166 lb 3.2 oz (75.388 kg)  BMI 24.90 kg/m2  SpO2 95%       Objective:   Physical Exam  Constitutional: He appears well-developed and well-nourished. No distress.  Cardiovascular: Normal rate and regular rhythm.   No murmur heard. Pulmonary/Chest: Effort normal and breath sounds normal. No respiratory distress. He has no wheezes. He has no rales. He exhibits no tenderness.  Neurological: He is alert.  Skin: Skin is warm and dry.  approx 2 inch diameter fluctuant abscess noted right upper abdomen          Assessment & Plan:

## 2015-02-03 NOTE — Assessment & Plan Note (Signed)
Stable with QOD miralax. Continue same.

## 2015-02-03 NOTE — Assessment & Plan Note (Signed)
This has stabilized. Sister reports ensure is too expensive.  Gave rx for [employer] as this is much cheaper. She also requests an rx for a medical grade scale. Rx provided.

## 2015-02-03 NOTE — Assessment & Plan Note (Signed)
Procedure including risks/benefits explained to patient and POA (sister Sybill)  Questions were answered. After informed consent was obtained and a time out completed, site was cleansed with betadine and then alcohol. 1% Lidocaine with epinephrine was injected to anesthetize the area.  Incision and drainage was performed and copious amount of thick white/green drainage was expressed. A culture was obtained and sent to the lab for evaluation.    Pt tolerated procedure well.  Advised pt and his sister/caregiver:  Start doxycycline for boil. Follow up in 1 week. Call if increased pain/redness/swelling or if fever >101.

## 2015-02-06 LAB — WOUND CULTURE
Gram Stain: NONE SEEN
Gram Stain: NONE SEEN
Organism ID, Bacteria: NO GROWTH

## 2015-02-10 ENCOUNTER — Ambulatory Visit (INDEPENDENT_AMBULATORY_CARE_PROVIDER_SITE_OTHER): Payer: Medicare Other | Admitting: Family

## 2015-02-10 ENCOUNTER — Encounter: Payer: Self-pay | Admitting: Family

## 2015-02-10 VITALS — BP 116/78 | HR 88 | Temp 98.1°F | Resp 18 | Ht 68.5 in | Wt 167.6 lb

## 2015-02-10 DIAGNOSIS — L02229 Furuncle of trunk, unspecified: Secondary | ICD-10-CM | POA: Diagnosis not present

## 2015-02-10 MED ORDER — DOXYCYCLINE HYCLATE 100 MG PO TABS: 100.0000 mg | ORAL_TABLET | Freq: Two times a day (BID) | ORAL | Status: DC

## 2015-02-10 NOTE — Assessment & Plan Note (Signed)
Nearly resolved. Advised care giver that we will continue doxy x 3 more days and to call if symptoms do not continue to improve

## 2015-02-10 NOTE — Progress Notes (Signed)
Subjective:    Patient ID: Edward [name], male    DOB: 24-Jul-1946, 69 y.o.   MRN: [id number]  HPI  Edward [name] is a 69 yr old male here for 1 week follow up of abscess on trunk.  Last week abscess was drained and we sent a culture of the exudate which noted no growth.    Review of Systems    see HPI  Past Medical History  Diagnosis Date  . Diabetes mellitus   . Urinary incontinence   . Mental retardation   . Schizophrenia   . BPH (benign prostatic hyperplasia)   . Thyroid disease   . Anemia   . Cataract   . Hypertension     DENIES    History   Social History  . Marital Status: Single    Spouse Name: N/A  . Number of Children: N/A  . Years of Education: N/A   Occupational History  . Not on file.   Social History Main Topics  . Smoking status: Never Smoker   . Smokeless tobacco: Current User    Types: Chew     Comment: chews tobacco  . Alcohol Use: No  . Drug Use: No  . Sexual Activity: Not on file   Other Topics Concern  . Not on file   Social History Narrative   Lives in a group home   Edward [name] is his legal guardian.   Edward and her family live locally    Past Surgical History  Procedure Laterality Date  . Prostate surgery      x2, ?procedure    Family History  Problem Relation Age of Onset  . Arthritis Mother   . Arrhythmia Mother   . Lymphoma Mother     non-hodgkins  . Kidney disease Mother   . Heart disease Father   . Stroke Father     No Known Allergies  Current Outpatient Prescriptions on File Prior to Visit  Medication Sig Dispense Refill  . Acetaminophen (ACETAMINOPHEN EXTRA STRENGTH) 167 MG/5ML LIQD Take 15 mLs by mouth.    Marland Kitchen aspirin 81 MG tablet Take 81 mg by mouth daily.    . clotrimazole-betamethasone (LOTRISONE) cream Apply 1 application topically 2 (two) times daily. 45 g 1  . Control Gel Formula Dressing (DUODERM CGF DRESSING) MISC Apply 1 each topically every 3 (three) days. 20 each 0  . desonide (DESOWEN) 0.05  % lotion Apply 1 application topically 3 (three) times a week.    . diphenhydrAMINE (BENADRYL) 25 mg capsule Take 2 capsules (50 mg total) by mouth at bedtime as needed for sleep. 60 capsule 3  . dutasteride (AVODART) 0.5 MG capsule Take 1 capsule (0.5 mg total) by mouth daily. 30 capsule 5  . Emollient (CERAVE) CREA Apply 1 application topically. For dry skin on feet and legs    . ferrous sulfate 325 (65 FE) MG tablet Take 1 tablet (325 mg total) by mouth 2 (two) times daily with a meal. 60 tablet 5  . FLUoxetine (PROZAC) 20 MG capsule Take 10 mg by mouth daily.     Marland Kitchen ketoconazole (NIZORAL) 2 % cream Apply 1 application topically 2 (two) times daily. For face    . levothyroxine (SYNTHROID, LEVOTHROID) 100 MCG tablet Take 1 tablet (100 mcg total) by mouth daily. 30 tablet 5  . lip balm (BLISTEX) OINT Apply 1 application topically 4 (four) times daily as needed for lip care.    . Loperamide HCl (IMODIUM A-D PO) Take by mouth  as needed.    . Nutritional Supplements (ENSURE ACTIVE) LIQD Take 1 Can by mouth 3 (three) times daily. 90 Bottle 2  . polyethylene glycol powder (GLYCOLAX/MIRALAX) powder 1 packet in 8 oz of water or juice once daily AS NEEDED for constipation 3350 g 1  . QUEtiapine (SEROQUEL) 50 MG tablet Take 50 mg by mouth 2 (two) times daily.    . Simethicone (MYLANTA GAS PO) Take by mouth as needed.    . tamsulosin (FLOMAX) 0.4 MG CAPS capsule Take 1 capsule (0.4 mg total) by mouth at bedtime. 30 capsule 5  . thioridazine (MELLARIL) 100 MG tablet Take 200 mg by mouth at bedtime.     Marland Kitchen thioridazine (MELLARIL) 50 MG tablet Take 1 tablet (50 mg total) by mouth every morning. 30 tablet 10   No current facility-administered medications on file prior to visit.    BP 116/78 mmHg  Pulse 88  Temp(Src) 98.1 F (36.7 C) (Oral)  Resp 18  Ht 5' 8.5" (1.74 m)  Wt 167 lb 9.6 oz (76.023 kg)  BMI 25.11 kg/m2  SpO2 98%    Objective:   Physical Exam  Constitutional: He is oriented to person,  place, and time. He appears well-developed and well-nourished. No distress.  HENT:  Head: Normocephalic and atraumatic.  Pulmonary/Chest: He is in respiratory distress.  Musculoskeletal: He exhibits no edema.  Neurological: He is alert and oriented to person, place, and time.  Skin: Skin is warm and dry.  Near resolution of abscess right upper anterior trunk.  Pea sized amount of exudate expressed, no odor, no erythema   Psychiatric: He has a normal mood and affect. His behavior is normal. Thought content normal.          Assessment & Plan:

## 2015-02-10 NOTE — Patient Instructions (Addendum)
Continue doxycycline for 3 more days.  Call if recurrent drainage, redness or swelling.  Follow up in 3 months.

## 2015-02-26 DIAGNOSIS — R339 Retention of urine, unspecified: Secondary | ICD-10-CM | POA: Diagnosis not present

## 2015-03-19 DIAGNOSIS — F209 Schizophrenia, unspecified: Secondary | ICD-10-CM | POA: Diagnosis not present

## 2015-03-19 DIAGNOSIS — F70 Mild intellectual disabilities: Secondary | ICD-10-CM | POA: Diagnosis not present

## 2015-04-04 ENCOUNTER — Telehealth: Payer: Self-pay | Admitting: Family

## 2015-04-04 NOTE — Telephone Encounter (Signed)
Caller name: VAYDEN WEINAND Relation to LY:[id number]  Call back number: [phone]    Reason for call:  Sister requesting a refill Nutritional Supplements (ENSURE ACTIVE) LIQD and daughter states patient has gain 11lbs and daughter is concerned.

## 2015-04-04 NOTE — Telephone Encounter (Signed)
Spoke with pt's sister. She states pt was at another appt on 03/19/15 and weight 177.9lb. This is an 11 pound weight gain since his visit with [place] in July.  She is wondering if the ensure should be stopped or reduced? She is requesting [employer] Ensure if he is to continue it and wants it to go to [facility] on [address].  Please advise?

## 2015-04-05 NOTE — Telephone Encounter (Signed)
I think we should stop ensure now that his diet has improved and he has regained weight.

## 2015-04-07 NOTE — Telephone Encounter (Signed)
Notified pt's sister and she voices understanding. She requests we fax order to pt's group home to d/c [employer] ensure. Letter printed and forwarded to PCP for signature. Left message on Tyrone's voicemail [phone]) to call and provide fax # to send order to.

## 2015-04-08 NOTE — Telephone Encounter (Signed)
Spoke with Tyrone at [phone], and obtained fax # [phone].  Letter faxed to stop ensure supplement.

## 2015-04-09 DIAGNOSIS — R339 Retention of urine, unspecified: Secondary | ICD-10-CM | POA: Diagnosis not present

## 2015-04-10 ENCOUNTER — Telehealth: Payer: Self-pay | Admitting: Family

## 2015-04-10 NOTE — Telephone Encounter (Signed)
[name] Ph# [phone]  Please write RX to d/c Ensure for pt. He said we called about it before bc pt has gained weight. Jiles Prows will come p/u RX to d/c when ready.

## 2015-04-11 ENCOUNTER — Other Ambulatory Visit: Payer: Self-pay | Admitting: Family

## 2015-04-11 NOTE — Telephone Encounter (Signed)
Spoke with Edward [name], he states he never received letter from 04/08/15 and he will pick it up. Letter placed at front desk.

## 2015-05-08 ENCOUNTER — Encounter: Payer: Self-pay | Admitting: Medical

## 2015-05-08 ENCOUNTER — Ambulatory Visit (INDEPENDENT_AMBULATORY_CARE_PROVIDER_SITE_OTHER): Payer: Medicare Other | Admitting: Medical

## 2015-05-08 VITALS — BP 118/70 | HR 68 | Temp 98.2°F | Ht 68.5 in | Wt 176.0 lb

## 2015-05-08 DIAGNOSIS — R062 Wheezing: Secondary | ICD-10-CM

## 2015-05-08 DIAGNOSIS — R05 Cough: Secondary | ICD-10-CM | POA: Diagnosis not present

## 2015-05-08 DIAGNOSIS — R0981 Nasal congestion: Secondary | ICD-10-CM | POA: Diagnosis not present

## 2015-05-08 DIAGNOSIS — J208 Acute bronchitis due to other specified organisms: Secondary | ICD-10-CM | POA: Diagnosis not present

## 2015-05-08 DIAGNOSIS — R059 Cough, unspecified: Secondary | ICD-10-CM

## 2015-05-08 MED ORDER — BENZONATATE 100 MG PO CAPS: 100.0000 mg | ORAL_CAPSULE | Freq: Three times a day (TID) | ORAL | Status: DC | PRN

## 2015-05-08 MED ORDER — ALBUTEROL SULFATE HFA 108 (90 BASE) MCG/ACT IN AERS: 2.0000 | INHALATION_SPRAY | Freq: Four times a day (QID) | RESPIRATORY_TRACT | Status: DC | PRN

## 2015-05-08 MED ORDER — FLUTICASONE PROPIONATE 50 MCG/ACT NA SUSP: 2.0000 | Freq: Every day | NASAL | Status: DC

## 2015-05-08 MED ORDER — DOXYCYCLINE HYCLATE 100 MG PO TABS: 100.0000 mg | ORAL_TABLET | Freq: Two times a day (BID) | ORAL | Status: DC

## 2015-05-08 NOTE — Patient Instructions (Addendum)
For bronchitis- rx doxycycline.  For cough  rx benzonatate.  For any wheezing albuterol rx.  For nasal congestion rx flonase.  With pt age and general condition and length of illness already one week decided to go ahead and give antibiotic.  Follow up 7-10 days or as needed.

## 2015-05-08 NOTE — Progress Notes (Signed)
Pre visit review using our clinic review tool, if applicable. No additional management support is needed unless otherwise documented below in the visit note. 

## 2015-05-08 NOTE — Progress Notes (Signed)
Subjective:    Patient ID: Edward [name], male    DOB: November 08, 1945, 69 y.o.   MRN: [id number]  HPI   Pt in with cough, congestion and runny nose. This started over the weekend.   Some purulent mucous when he blows nose. Non productive cough.  No sinus pressure.    Review of Systems  Constitutional: Positive for chills and fatigue. Negative for fever.  HENT: Positive for congestion and sneezing. Negative for ear pain and sinus pressure.   Respiratory: Positive for cough and wheezing. Negative for apnea, chest tightness and shortness of breath.        At time mild. Per staff member at [facility].  Cardiovascular: Negative for chest pain and palpitations.  Genitourinary: Negative for urgency, frequency, flank pain and testicular pain.  Musculoskeletal: Negative for myalgias and back pain.  Skin: Negative for rash.  Neurological: Negative for dizziness and headaches.  Hematological: Negative for adenopathy. Does not bruise/bleed easily.  Psychiatric/Behavioral: Negative for behavioral problems and confusion.     Past Medical History  Diagnosis Date  . Diabetes mellitus   . Urinary incontinence   . Mental retardation   . Schizophrenia ([place])   . BPH (benign prostatic hyperplasia)   . Thyroid disease   . Anemia   . Cataract   . Hypertension     DENIES    Social History   Social History  . Marital Status: Single    Spouse Name: N/A  . Number of Children: N/A  . Years of Education: N/A   Occupational History  . Not on file.   Social History Main Topics  . Smoking status: Never Smoker   . Smokeless tobacco: Current User    Types: Chew     Comment: chews tobacco  . Alcohol Use: No  . Drug Use: No  . Sexual Activity: Not on file   Other Topics Concern  . Not on file   Social History Narrative   Lives in a group home   Sister [name] is his legal guardian.   Sister and her family live locally    Past Surgical History  Procedure Laterality Date  . Prostate  surgery      x2, ?procedure    Family History  Problem Relation Age of Onset  . Arthritis Mother   . Arrhythmia Mother   . Lymphoma Mother     non-hodgkins  . Kidney disease Mother   . Heart disease Father   . Stroke Father     No Known Allergies  Current Outpatient Prescriptions on File Prior to Visit  Medication Sig Dispense Refill  . Acetaminophen (ACETAMINOPHEN EXTRA STRENGTH) 167 MG/5ML LIQD Take 15 mLs by mouth.    Marland Kitchen aspirin 81 MG tablet Take 81 mg by mouth daily.    . clotrimazole-betamethasone (LOTRISONE) cream APPLY 1 APPLICATION TOPICALLY TWICE A DAY 45 g 3  . Control Gel Formula Dressing (DUODERM CGF DRESSING) MISC Apply 1 each topically every 3 (three) days. 20 each 0  . desonide (DESOWEN) 0.05 % lotion Apply 1 application topically 3 (three) times a week.    . diphenhydrAMINE (BENADRYL) 25 mg capsule Take 2 capsules (50 mg total) by mouth at bedtime as needed for sleep. 60 capsule 3  . doxycycline (VIBRA-TABS) 100 MG tablet Take 1 tablet (100 mg total) by mouth 2 (two) times daily. 6 tablet 0  . dutasteride (AVODART) 0.5 MG capsule Take 1 capsule (0.5 mg total) by mouth daily. 30 capsule 5  . Emollient (CERAVE)  CREA Apply 1 application topically. For dry skin on feet and legs    . ferrous sulfate 325 (65 FE) MG tablet Take 1 tablet (325 mg total) by mouth 2 (two) times daily with a meal. 60 tablet 5  . FLUoxetine (PROZAC) 20 MG capsule Take 10 mg by mouth daily.     Marland Kitchen ketoconazole (NIZORAL) 2 % cream Apply 1 application topically 2 (two) times daily. For face    . levothyroxine (SYNTHROID, LEVOTHROID) 100 MCG tablet Take 1 tablet (100 mcg total) by mouth daily. 30 tablet 5  . lip balm (BLISTEX) OINT Apply 1 application topically 4 (four) times daily as needed for lip care.    . Loperamide HCl (IMODIUM A-D PO) Take by mouth as needed.    . Nutritional Supplements (ENSURE ACTIVE) LIQD Take 1 Can by mouth 3 (three) times daily. 90 Bottle 2  . polyethylene glycol powder  (GLYCOLAX/MIRALAX) powder 1 packet in 8 oz of water or juice once daily AS NEEDED for constipation 3350 g 1  . QUEtiapine (SEROQUEL) 50 MG tablet Take 50 mg by mouth 2 (two) times daily.    . Simethicone (MYLANTA GAS PO) Take by mouth as needed.    . tamsulosin (FLOMAX) 0.4 MG CAPS capsule Take 1 capsule (0.4 mg total) by mouth at bedtime. 30 capsule 5  . thioridazine (MELLARIL) 100 MG tablet Take 200 mg by mouth at bedtime.     Marland Kitchen thioridazine (MELLARIL) 50 MG tablet Take 1 tablet (50 mg total) by mouth every morning. 30 tablet 10   No current facility-administered medications on file prior to visit.    BP 118/70 mmHg  Pulse 68  Temp(Src) 98.2 F (36.8 C) (Oral)  Ht 5' 8.5" (1.74 m)  Wt 176 lb (79.833 kg)  BMI 26.37 kg/m2  SpO2 98%       Objective:   Physical Exam  General  Mental Status - Alert. General Appearance - Well groomed. Not in acute distress.  Skin Rashes- No Rashes.  HEENT Head- Normal. Ear Auditory Canal - Left- Normal. Right - Normal.Tympanic Membrane- Left- Normal. Right- Normal. Eye Sclera/Conjunctiva- Left- Normal. Right- Normal. Nose & Sinuses Nasal Mucosa- Left-  Boggy and Congested. Right-  Boggy and  Congested.Bilateral no  maxillary and no frontal sinus pressure. Mouth & Throat Lips: Upper Lip- Normal: no dryness, cracking, pallor, cyanosis, or vesicular eruption. Lower Lip-Normal: no dryness, cracking, pallor, cyanosis or vesicular eruption. Buccal Mucosa- Bilateral- No Aphthous ulcers. Oropharynx- No Discharge or Erythema. Tonsils: Characteristics- Bilateral- No Erythema or Congestion. Size/Enlargement- Bilateral- No enlargement. Discharge- bilateral-None.  Neck Neck- Supple. No Masses.   Chest and Lung Exam Auscultation: Breath Sounds:- even and unlabored. Faint upper lobe rhonchi.  Cardiovascular Auscultation:Rythm- Regular, rate and rhythm. Murmurs & Other Heart Sounds:Ausculatation of the heart reveal- No Murmurs.  Lymphatic Head &  Neck General Head & Neck Lymphatics: Bilateral: Description- No Localized lymphadenopathy.       Assessment & Plan:  For bronchitis- rx doxycycline.  For cough  rx benzonatate.  For any wheezing albuterol rx.  For nasal congestion rx flonase.  With pt age and general condition and length of illness already one week decided to go ahead and give antibiotic.  Follow up 7-10 days or as needed

## 2015-05-15 ENCOUNTER — Encounter: Payer: Medicare Other | Admitting: Medical

## 2015-05-15 ENCOUNTER — Telehealth: Payer: Self-pay | Admitting: Family

## 2015-05-15 NOTE — Telephone Encounter (Signed)
Spoke with provider, no fee is to be charged.    Thanks.

## 2015-05-15 NOTE — Telephone Encounter (Signed)
Edward please advise.  

## 2015-05-15 NOTE — Progress Notes (Signed)
This encounter was created in error - please disregard.
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
## 2015-05-20 ENCOUNTER — Ambulatory Visit (INDEPENDENT_AMBULATORY_CARE_PROVIDER_SITE_OTHER): Payer: Medicare Other | Admitting: Family

## 2015-05-20 ENCOUNTER — Encounter: Payer: Self-pay | Admitting: Family

## 2015-05-20 VITALS — BP 130/58 | HR 81 | Temp 97.6°F | Resp 16 | Ht 69.0 in | Wt 174.4 lb

## 2015-05-20 DIAGNOSIS — E039 Hypothyroidism, unspecified: Secondary | ICD-10-CM

## 2015-05-20 DIAGNOSIS — J029 Acute pharyngitis, unspecified: Secondary | ICD-10-CM

## 2015-05-20 DIAGNOSIS — Z23 Encounter for immunization: Secondary | ICD-10-CM

## 2015-05-20 DIAGNOSIS — E119 Type 2 diabetes mellitus without complications: Secondary | ICD-10-CM

## 2015-05-20 DIAGNOSIS — R739 Hyperglycemia, unspecified: Secondary | ICD-10-CM | POA: Diagnosis not present

## 2015-05-20 DIAGNOSIS — I10 Essential (primary) hypertension: Secondary | ICD-10-CM

## 2015-05-20 LAB — HEMOGLOBIN A1C: HEMOGLOBIN A1C: 5.4 % (ref 4.6–6.5)

## 2015-05-20 LAB — BASIC METABOLIC PANEL
BUN: 24 mg/dL — ABNORMAL HIGH (ref 6–23)
CALCIUM: 9.1 mg/dL (ref 8.4–10.5)
CO2: 31 meq/L (ref 19–32)
CREATININE: 1.06 mg/dL (ref 0.40–1.50)
Chloride: 105 mEq/L (ref 96–112)
GFR: 73.05 mL/min (ref >60.00)
Glucose, Bld: 171 mg/dL — ABNORMAL HIGH (ref 70–99)
Potassium: 4.2 mEq/L (ref 3.5–5.1)
SODIUM: 142 meq/L (ref 135–145)

## 2015-05-20 LAB — TSH: TSH: 2.35 u[IU]/mL (ref 0.35–4.50)

## 2015-05-20 NOTE — Assessment & Plan Note (Signed)
BP stable off of meds.

## 2015-05-20 NOTE — Assessment & Plan Note (Signed)
Obtain follow up A1C. Has been much better since he lost weight.  I did ask the caregiver to continue to weigh the patient and let us know if he has further weight loss. He dropped 2 pounds which I attribute to his recent illness.

## 2015-05-20 NOTE — Progress Notes (Signed)
Pre visit review using our clinic review tool, if applicable. No additional management support is needed unless otherwise documented below in the visit note. 

## 2015-05-20 NOTE — Patient Instructions (Signed)
Please complete lab work prior to leaving. Weigh weekly. Call if pt continues to lose weight. Tylenol as needed for sore throat. Call if sore throat worsens or if symptoms do not improve in 2-3 days.

## 2015-05-20 NOTE — Progress Notes (Signed)
Subjective:    Patient ID: MAIKA KACZMAREK, male    DOB: September 13, 1945, 69 y.o.   MRN: [id number]  HPI  Mr. Barbier is a 69 yr old male who presents today with chief complaint of sore throat. Reports that sore throat began this AM.  Caregiver notes no fever at home.    HTN-  Not currently on BP meds.   BP Readings from Last 3 Encounters:  05/20/15 130/58  05/08/15 118/70  02/10/15 116/78   Weight loss- overall good appetite per caregiver, however he had bronchitis a few weeks back and his appetite was not good during that time.    Wt Readings from Last 3 Encounters:  05/20/15 174 lb 6.4 oz (79.107 kg)  05/08/15 176 lb (79.833 kg)  02/10/15 167 lb 9.6 oz (76.023 kg)   Hyperglycemia-  Lab Results  Component Value Date   HGBA1C 5.1 01/15/2015   Hypothyroid-  Lab Results  Component Value Date   TSH 8.30* 11/15/2014     Review of Systems See HPI  Past Medical History  Diagnosis Date  . Diabetes mellitus   . Urinary incontinence   . Mental retardation   . Schizophrenia ([place])   . BPH (benign prostatic hyperplasia)   . Thyroid disease   . Anemia   . Cataract   . Hypertension     DENIES    Social History   Social History  . Marital Status: Single    Spouse Name: N/A  . Number of Children: N/A  . Years of Education: N/A   Occupational History  . Not on file.   Social History Main Topics  . Smoking status: Never Smoker   . Smokeless tobacco: Current User    Types: Chew     Comment: chews tobacco  . Alcohol Use: No  . Drug Use: No  . Sexual Activity: Not on file   Other Topics Concern  . Not on file   Social History Narrative   Lives in a group home   Sister [name] is his legal guardian.   Sister and her family live locally    Past Surgical History  Procedure Laterality Date  . Prostate surgery      x2, ?procedure    Family History  Problem Relation Age of Onset  . Arthritis Mother   . Arrhythmia Mother   . Lymphoma Mother     non-hodgkins   . Kidney disease Mother   . Heart disease Father   . Stroke Father     No Known Allergies  Current Outpatient Prescriptions on File Prior to Visit  Medication Sig Dispense Refill  . Acetaminophen (ACETAMINOPHEN EXTRA STRENGTH) 167 MG/5ML LIQD Take 15 mLs by mouth.    Marland Kitchen aspirin 81 MG tablet Take 81 mg by mouth daily.    . clotrimazole-betamethasone (LOTRISONE) cream APPLY 1 APPLICATION TOPICALLY TWICE A DAY 45 g 3  . Control Gel Formula Dressing (DUODERM CGF DRESSING) MISC Apply 1 each topically every 3 (three) days. 20 each 0  . desonide (DESOWEN) 0.05 % lotion Apply 1 application topically 3 (three) times a week.    . diphenhydrAMINE (BENADRYL) 25 mg capsule Take 2 capsules (50 mg total) by mouth at bedtime as needed for sleep. 60 capsule 3  . dutasteride (AVODART) 0.5 MG capsule Take 1 capsule (0.5 mg total) by mouth daily. 30 capsule 5  . Emollient (CERAVE) CREA Apply 1 application topically. For dry skin on feet and legs    . ferrous sulfate 325 (  65 FE) MG tablet Take 1 tablet (325 mg total) by mouth 2 (two) times daily with a meal. 60 tablet 5  . FLUoxetine (PROZAC) 20 MG capsule Take 10 mg by mouth daily.     . fluticasone (FLONASE) 50 MCG/ACT nasal spray Place 2 sprays into both nostrils daily. 16 g 1  . ketoconazole (NIZORAL) 2 % cream Apply 1 application topically 2 (two) times daily. For face    . levothyroxine (SYNTHROID, LEVOTHROID) 100 MCG tablet Take 1 tablet (100 mcg total) by mouth daily. 30 tablet 5  . lip balm (BLISTEX) OINT Apply 1 application topically 4 (four) times daily as needed for lip care.    . Loperamide HCl (IMODIUM A-D PO) Take by mouth as needed.    . Nutritional Supplements (ENSURE ACTIVE) LIQD Take 1 Can by mouth 3 (three) times daily. 90 Bottle 2  . polyethylene glycol powder (GLYCOLAX/MIRALAX) powder 1 packet in 8 oz of water or juice once daily AS NEEDED for constipation 3350 g 1  . QUEtiapine (SEROQUEL) 50 MG tablet Take 50 mg by mouth 2 (two) times  daily.    . Simethicone (MYLANTA GAS PO) Take by mouth as needed.    . tamsulosin (FLOMAX) 0.4 MG CAPS capsule Take 1 capsule (0.4 mg total) by mouth at bedtime. 30 capsule 5  . thioridazine (MELLARIL) 100 MG tablet Take 200 mg by mouth at bedtime.     Marland Kitchen thioridazine (MELLARIL) 50 MG tablet Take 1 tablet (50 mg total) by mouth every morning. 30 tablet 10   No current facility-administered medications on file prior to visit.    BP 130/58 mmHg  Pulse 81  Temp(Src) 97.6 F (36.4 C) (Oral)  Resp 16  Ht 5\' 9"  (1.753 m)  Wt 174 lb 6.4 oz (79.107 kg)  BMI 25.74 kg/m2  SpO2 100%       Objective:   Physical Exam  Constitutional: He appears well-developed and well-nourished. No distress.  HENT:  Head: Normocephalic and atraumatic.  Right Ear: Tympanic membrane and ear canal normal.  Left Ear: Tympanic membrane and ear canal normal.  Very minimal oropharyngeal erythema  Cardiovascular: Normal rate and regular rhythm.   No murmur heard. Pulmonary/Chest: Effort normal and breath sounds normal. No respiratory distress. He has no wheezes. He has no rales.  Abdominal: Soft. He exhibits no distension. There is no tenderness.  Musculoskeletal: He exhibits no edema.  Lymphadenopathy:    He has no cervical adenopathy.  Neurological: He is alert.  Skin: Skin is warm and dry.          Assessment & Plan:  Viral pharyngitis- advised caregiver- ok to give tylenol prn. Call if fever, if symptoms worsen or if symptoms do not improve.

## 2015-05-20 NOTE — Assessment & Plan Note (Signed)
Obtain follow up TSH.  

## 2015-05-21 ENCOUNTER — Encounter: Payer: Self-pay | Admitting: Family

## 2015-05-22 DIAGNOSIS — N401 Enlarged prostate with lower urinary tract symptoms: Secondary | ICD-10-CM | POA: Diagnosis not present

## 2015-05-27 ENCOUNTER — Telehealth: Payer: Self-pay | Admitting: *Deleted

## 2015-05-27 NOTE — Telephone Encounter (Signed)
ROI received via fax from [facility]. Forwarded to [place] to scan/email to medical records. JG//CMA

## 2015-06-20 IMAGING — DX DG ABDOMEN 1V
3 series · 3 of 3 positions shown · non-contrast
Comparison: 11/15/2014.

CLINICAL DATA: Initial encounter for water more day history of
constipation.

EXAM:
ABDOMEN - 1 VIEW

[abdomen supine (1 of 3)]
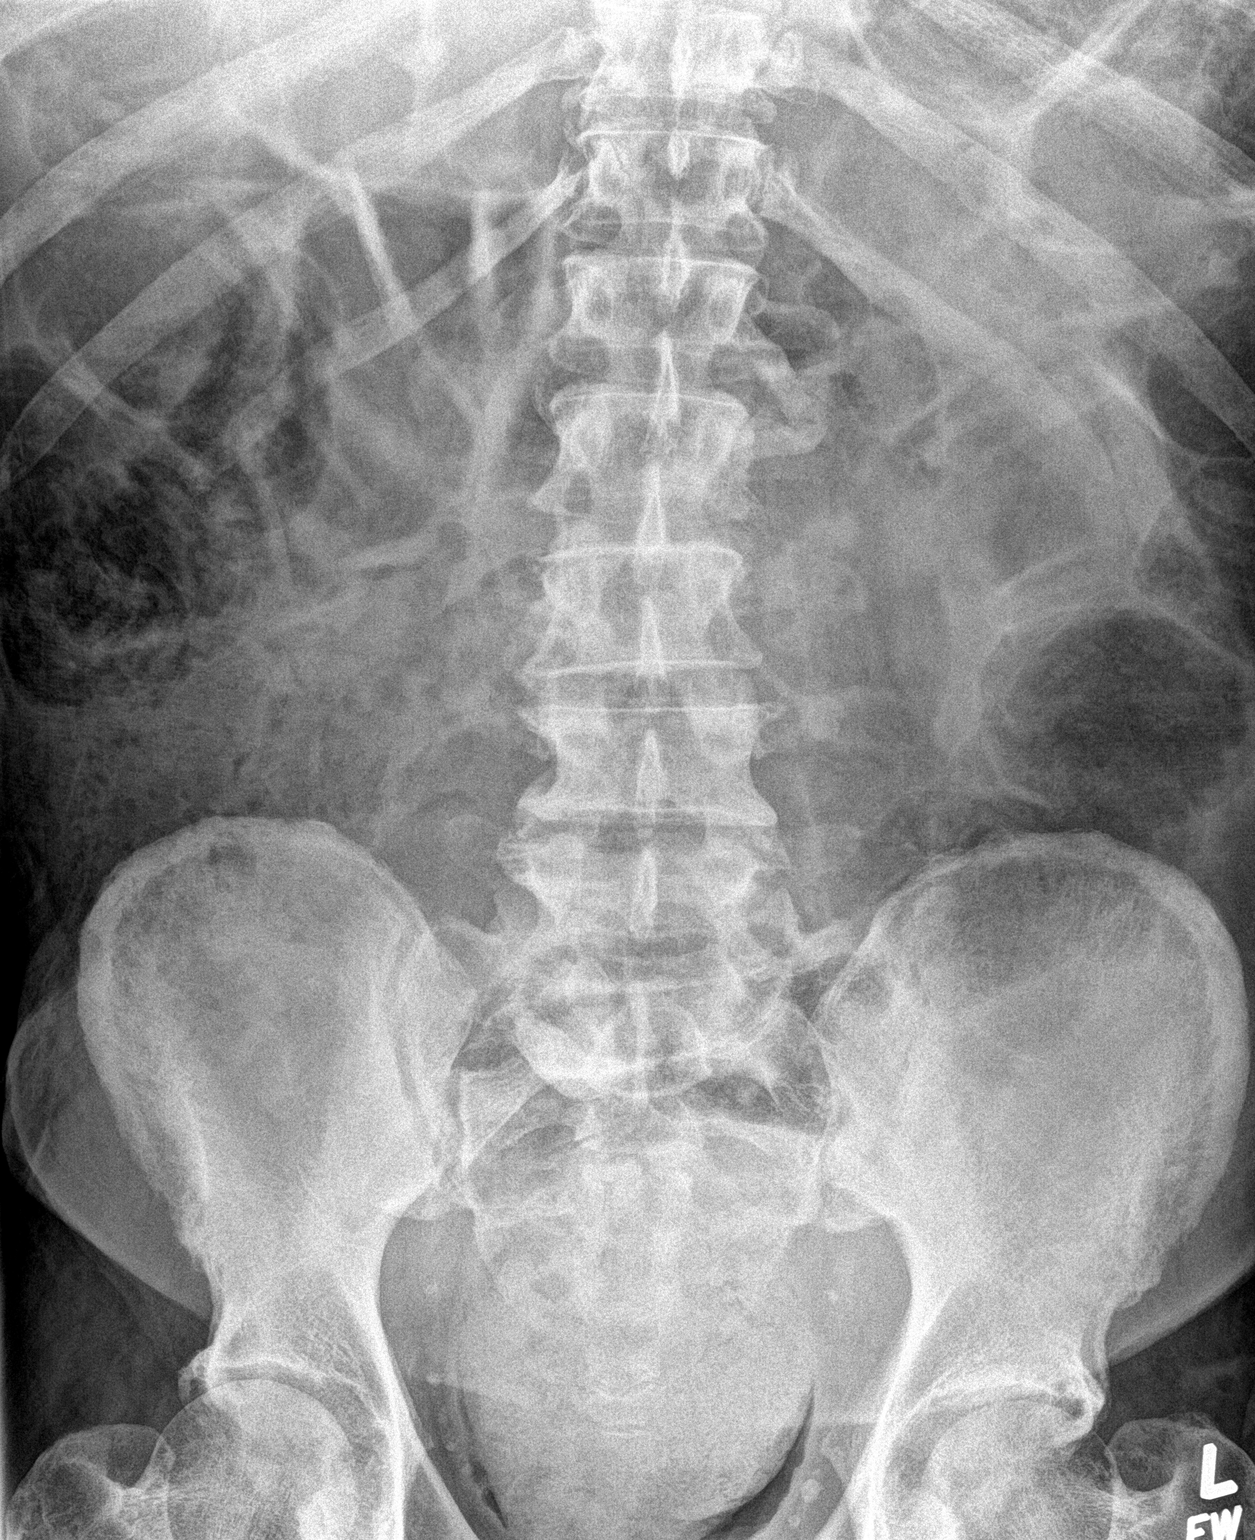

[abdomen supine (2 of 3)]
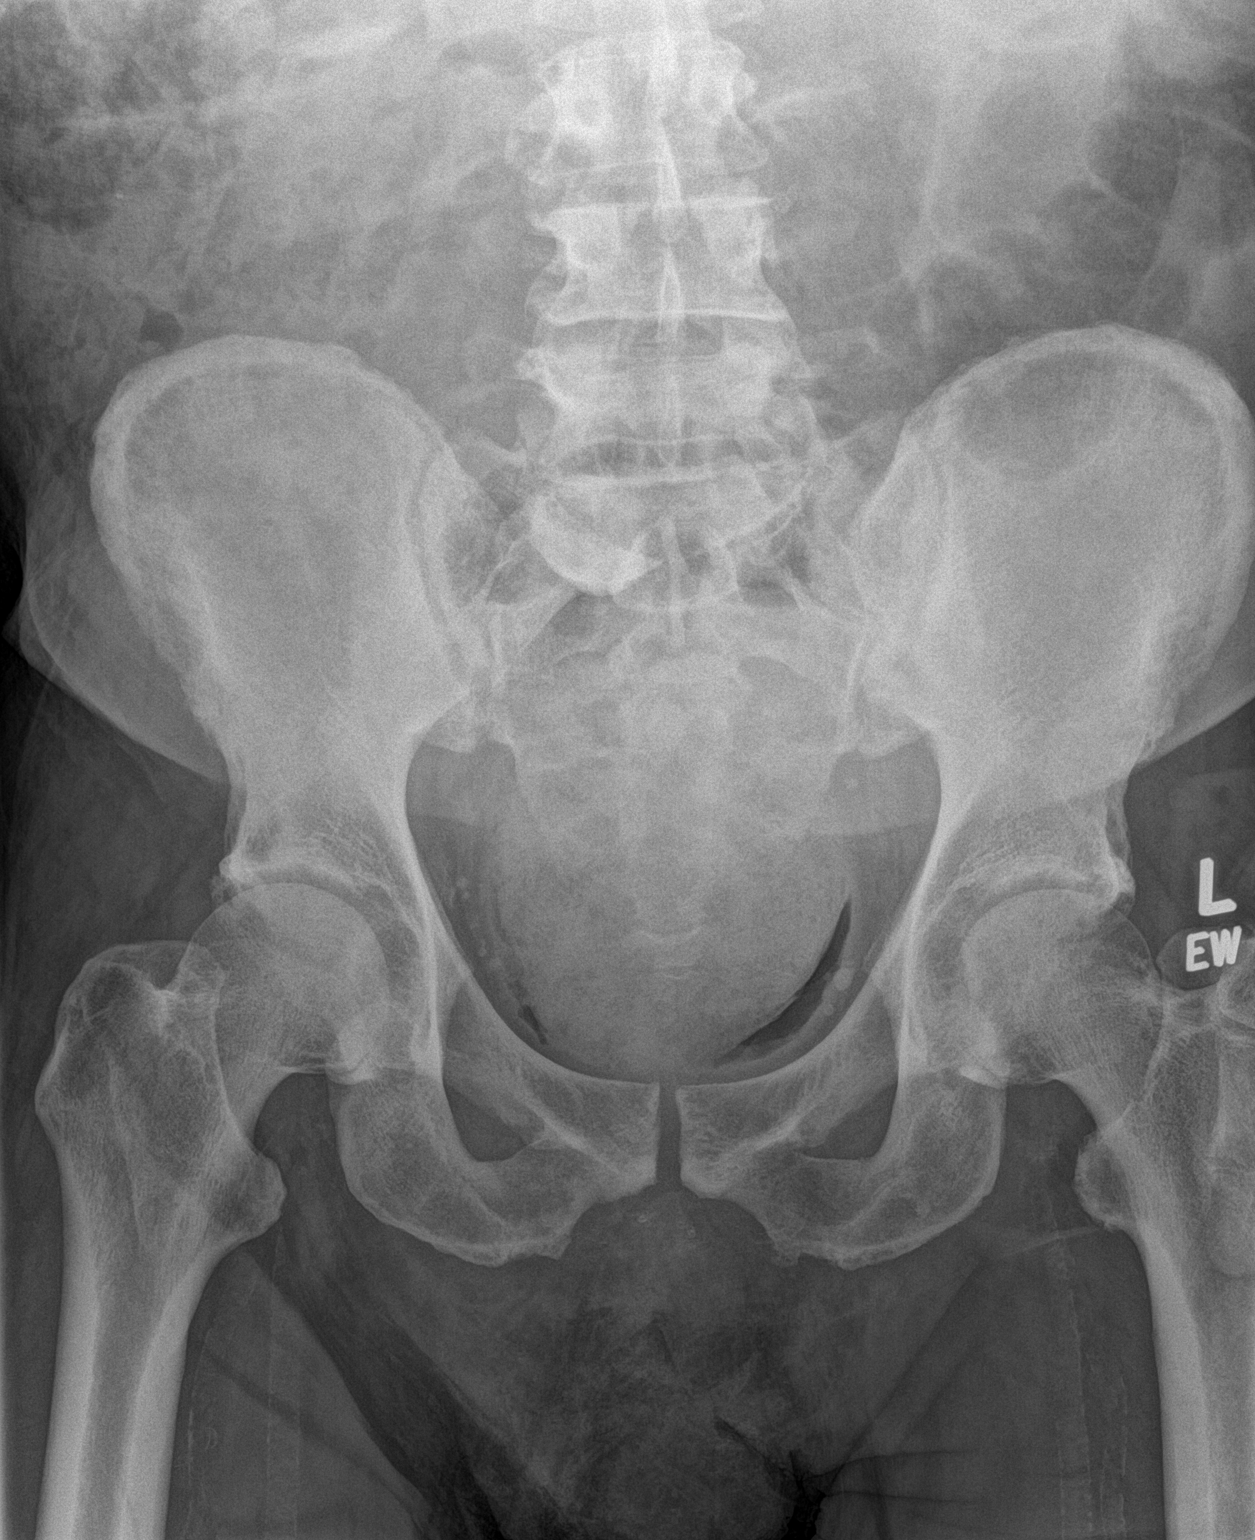

[abdomen supine (3 of 3)]
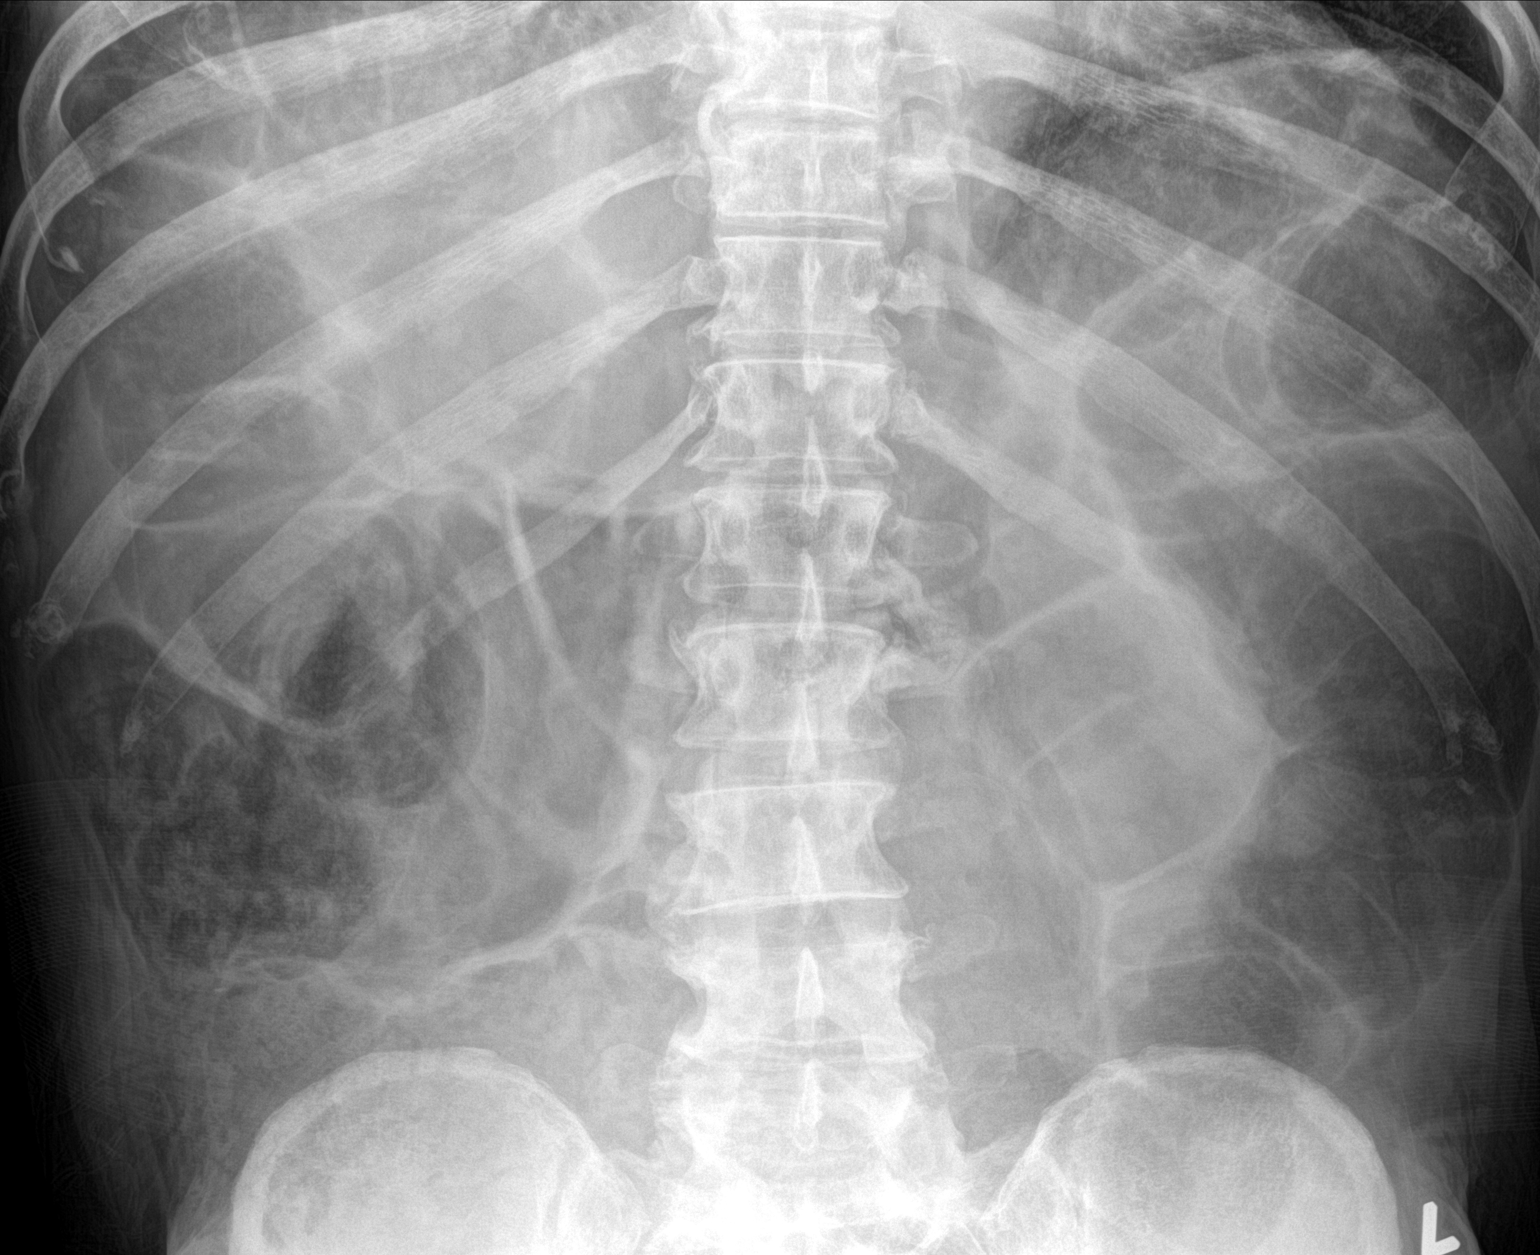

[3 of 3 positions shown; findings below may reference images not displayed]

FINDINGS: Supine view of the abdomen shows diffuse gaseous bowel distension
which mainly appears to represent colon. The degree of distention is
similar to slightly progressed when compared to the previous study.
Large stool volume noted in the rectum.
IMPRESSION: Slight interval progression of diffuse colonic distention with large
stool volume in the rectum. Fecal impaction would be a
consideration.

## 2015-06-24 DIAGNOSIS — F70 Mild intellectual disabilities: Secondary | ICD-10-CM | POA: Diagnosis not present

## 2015-06-24 DIAGNOSIS — F209 Schizophrenia, unspecified: Secondary | ICD-10-CM | POA: Diagnosis not present

## 2015-07-03 ENCOUNTER — Telehealth: Payer: Self-pay | Admitting: Family

## 2015-07-03 DIAGNOSIS — R338 Other retention of urine: Secondary | ICD-10-CM | POA: Diagnosis not present

## 2015-07-03 NOTE — Telephone Encounter (Signed)
Caller name: Romelle Starcher with [facility] [place] Relationship to patient: Group Home Can be reached: [phone] Pharmacy: [facility] in [place] Fax# [phone]  Reason for call:  1. Romelle Starcher will hand deliver form to the office 07/04/15 that needs to be completed for pts Medicaid.  2. Pt needing new diabetic meter and supplies sent to pharmacy above. His is old and cannot get the correct supplies for it. 3. Needing refill on Quetiapine 50mg . Takes 1 in morning and 1 at night. Also send to pharmacy above.

## 2015-07-04 NOTE — Telephone Encounter (Signed)
Jackie Plum from [facility] dropped off paperwork to be filled out.

## 2015-07-07 NOTE — Telephone Encounter (Signed)
Forwarded to Melissa. JG//CMA  

## 2015-07-08 ENCOUNTER — Ambulatory Visit (HOSPITAL_BASED_OUTPATIENT_CLINIC_OR_DEPARTMENT_OTHER)
Admission: RE | Admit: 2015-07-08 | Discharge: 2015-07-08 | Disposition: A | Discharge status: Home / Self Care | Payer: Medicare Other | Source: Ambulatory Visit | Attending: Physician Assistant | Admitting: Physician Assistant

## 2015-07-08 ENCOUNTER — Ambulatory Visit (INDEPENDENT_AMBULATORY_CARE_PROVIDER_SITE_OTHER): Payer: Medicare Other | Admitting: Physician Assistant

## 2015-07-08 ENCOUNTER — Encounter: Payer: Self-pay | Admitting: Physician Assistant

## 2015-07-08 VITALS — BP 120/60 | HR 93 | Temp 97.9°F | Ht 69.0 in | Wt 172.0 lb

## 2015-07-08 DIAGNOSIS — F79 Unspecified intellectual disabilities: Secondary | ICD-10-CM | POA: Diagnosis not present

## 2015-07-08 DIAGNOSIS — J690 Pneumonitis due to inhalation of food and vomit: Secondary | ICD-10-CM | POA: Diagnosis not present

## 2015-07-08 DIAGNOSIS — T17908A Unspecified foreign body in respiratory tract, part unspecified causing other injury, initial encounter: Secondary | ICD-10-CM

## 2015-07-08 DIAGNOSIS — I4581 Long QT syndrome: Secondary | ICD-10-CM

## 2015-07-08 DIAGNOSIS — R918 Other nonspecific abnormal finding of lung field: Secondary | ICD-10-CM | POA: Insufficient documentation

## 2015-07-08 DIAGNOSIS — R9431 Abnormal electrocardiogram [ECG] [EKG]: Secondary | ICD-10-CM | POA: Insufficient documentation

## 2015-07-08 DIAGNOSIS — R0989 Other specified symptoms and signs involving the circulatory and respiratory systems: Secondary | ICD-10-CM | POA: Diagnosis not present

## 2015-07-08 DIAGNOSIS — X58XXXA Exposure to other specified factors, initial encounter: Secondary | ICD-10-CM | POA: Insufficient documentation

## 2015-07-08 DIAGNOSIS — T17998A Other foreign object in respiratory tract, part unspecified causing other injury, initial encounter: Secondary | ICD-10-CM | POA: Insufficient documentation

## 2015-07-08 MED ORDER — ACCU-CHEK AVIVA PLUS W/DEVICE KIT: PACK | Status: DC

## 2015-07-08 MED ORDER — GLUCOSE BLOOD VI STRP: ORAL_STRIP | Status: DC

## 2015-07-08 MED ORDER — ACCU-CHEK SOFTCLIX LANCET DEV MISC: Status: DC

## 2015-07-08 NOTE — Telephone Encounter (Signed)
Notified Stephanie at below # to contact Dr Adele Schilder (psych) for quetiapine refill.  Melissa--please advise re: form completion as requested below.
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
## 2015-07-08 NOTE — Patient Instructions (Addendum)
Edward [name] looks and sounds good today. I do not hear anything worrisome on his lung examination. His oxygen levels and vital signs look great. I have reviewed the EKG performed by the ENT -- Edward [name] does have something called a prolonged QT interval which predisposes him to heart arrhythmias. I am setting him up to see a Cardiologist for assessment.  The biggest risk we have from the choking is aspirating fluids into the lungs. His exam is good today so I see no sign of infection. I would like for you to go downstairs for a chest x-ray today to rule out pneumonia.

## 2015-07-08 NOTE — Telephone Encounter (Signed)
Looks like Dr Casimiro Needle (psychiatry) has been prescribing quetiapine. New Rxs sent for glucometer, test strips and lancets.

## 2015-07-08 NOTE — Progress Notes (Signed)
Pre visit review using our clinic review tool, if applicable. No additional management support is needed unless otherwise documented below in the visit note. 

## 2015-07-09 NOTE — Telephone Encounter (Signed)
Form forwarded to [employer] For completion.

## 2015-07-09 NOTE — Telephone Encounter (Signed)
Form is complete.

## 2015-07-11 NOTE — Telephone Encounter (Signed)
Called and let Colletta Maryland know that FL2 form is ready for pick up at our front desk. Copy sent for scanning. JG//CMA

## 2015-07-13 NOTE — Addendum Note (Signed)
Addended by: Raiford Noble on: 07/13/2015 07:12 PM   Modules accepted: Orders

## 2015-07-13 NOTE — Assessment & Plan Note (Signed)
X-ray with mild bibasilar atelectasis. Negative for pneumonia.  Discussed symptoms to watch out for concerning for a developing pneumonia. Supportive measures reviewed. Physical without any concerning findings. Follow-up PRN.

## 2015-07-13 NOTE — Assessment & Plan Note (Signed)
Noted on EKG brought in by patient's sister from EMS. Patient is on older antipsychotics that are common culprits for prolonged QT. Patient to discuss this with his psychiatrist. Asymptomatic but will refer to Cardiology just to make sure nothing further is needed.

## 2015-07-15 ENCOUNTER — Telehealth: Payer: Self-pay | Admitting: *Deleted

## 2015-07-15 MED ORDER — GLUCOSE BLOOD VI STRP: ORAL_STRIP | Status: AC

## 2015-07-15 MED ORDER — ACCU-CHEK SOFTCLIX LANCET DEV MISC: Status: AC

## 2015-07-15 MED ORDER — ACCU-CHEK AVIVA PLUS W/DEVICE KIT: PACK | Status: AC

## 2015-07-15 NOTE — Telephone Encounter (Signed)
[name] caregiver came to office stating their pharmacy never received the prescriptions we sent to them for glucometer, lancets and test strips on 07/08/15.  Rxs printed, signed and given to caregiver, Colletta [place].

## 2015-07-23 ENCOUNTER — Telehealth: Payer: Self-pay | Admitting: Family

## 2015-07-23 ENCOUNTER — Encounter: Payer: Self-pay | Admitting: *Deleted

## 2015-07-23 NOTE — Telephone Encounter (Signed)
Patient Name: Edward [name]  DOB: September 14, 1945    Initial Comment caller states patient has sores in his mouth and isn't eating   Nurse Assessment  Nurse: Orvan Seen, RN, Jacquilin Date/Time (Eastern Time): 07/23/2015 9:39:20 AM  Confirm and document reason for call. If symptomatic, describe symptoms. ---caller states patient has sores in his mouth and isn't eating- Still drinking well. Caller states she can't really see in the mouth. No fever or bleeding.  Has the patient traveled out of the country within the last 30 days? ---No  Does the patient have any new or worsening symptoms? ---Yes  Will a triage be completed? ---Yes  Related visit to physician within the last 2 weeks? ---No  Does the PT have any chronic conditions? (i.e. diabetes, asthma, etc.) ---Yes  List chronic conditions. ---Diabetes, mental health issues, BPH, hypothryoid, anemia, cataract, HTN  Is this a behavioral health or substance abuse call? ---No     Guidelines    Guideline Title Affirmed Question Affirmed Notes  Mouth Symptoms All other mouth symptoms (Exceptions: dry mouth from not drinking enough liquids, chapped lips)    Final Disposition User   See PCP When Office is Open (within 3 days) Atkins, RN, Jacquilin    Comments  Caller states he has a appointment at 1415 on 12/28- Caller advised until then to try cold foods (ice cream, jello, soft foods) and encourage liquids   Disagree/Comply: Comply

## 2015-07-24 ENCOUNTER — Encounter: Payer: Self-pay | Admitting: Medical

## 2015-07-24 ENCOUNTER — Ambulatory Visit (INDEPENDENT_AMBULATORY_CARE_PROVIDER_SITE_OTHER): Payer: Medicare Other | Admitting: Medical

## 2015-07-24 VITALS — BP 110/70 | HR 92 | Temp 97.8°F | Resp 16 | Ht 69.0 in | Wt 171.6 lb

## 2015-07-24 DIAGNOSIS — M2669 Other specified disorders of temporomandibular joint: Secondary | ICD-10-CM

## 2015-07-24 DIAGNOSIS — R591 Generalized enlarged lymph nodes: Secondary | ICD-10-CM

## 2015-07-24 DIAGNOSIS — M272 Inflammatory conditions of jaws: Secondary | ICD-10-CM | POA: Diagnosis not present

## 2015-07-24 DIAGNOSIS — R599 Enlarged lymph nodes, unspecified: Secondary | ICD-10-CM | POA: Diagnosis not present

## 2015-07-24 DIAGNOSIS — R220 Localized swelling, mass and lump, head: Secondary | ICD-10-CM

## 2015-07-24 LAB — CBC WITH DIFFERENTIAL/PLATELET
BASOS ABS: 0.1 10*3/uL (ref 0.0–0.1)
BASOS PCT: 1 % (ref 0–1)
Eosinophils Absolute: 0.9 10*3/uL — ABNORMAL HIGH (ref 0.0–0.7)
Eosinophils Relative: 11 % — ABNORMAL HIGH (ref 0–5)
HCT: 36.5 % — ABNORMAL LOW (ref 39.0–52.0)
HEMOGLOBIN: 12.9 g/dL — AB (ref 13.0–17.0)
Lymphocytes Relative: 17 % (ref 12–46)
Lymphs Abs: 1.4 10*3/uL (ref 0.7–4.0)
MCH: 33 pg (ref 26.0–34.0)
MCHC: 35.3 g/dL (ref 30.0–36.0)
MCV: 93.4 fL (ref 78.0–100.0)
MONO ABS: 0.5 10*3/uL (ref 0.1–1.0)
MPV: 8.3 fL — AB (ref 8.6–12.4)
Monocytes Relative: 6 % (ref 3–12)
NEUTROS ABS: 5.3 10*3/uL (ref 1.7–7.7)
NEUTROS PCT: 65 % (ref 43–77)
Platelets: 261 10*3/uL (ref 150–400)
RBC: 3.91 MIL/uL — AB (ref 4.22–5.81)
RDW: 13.7 % (ref 11.5–15.5)
WBC: 8.2 10*3/uL (ref 4.0–10.5)

## 2015-07-24 MED ORDER — AMOXICILLIN-POT CLAVULANATE 875-125 MG PO TABS: 1.0000 | ORAL_TABLET | Freq: Two times a day (BID) | ORAL | Status: DC

## 2015-07-24 NOTE — Progress Notes (Signed)
Subjective:    Patient ID: Edward [name], male    DOB: 07-Dec-1945, 69 y.o.   MRN: [id number]  HPI  Pt in with some rt lower jaw area mild swollen. Pt has no pain. Staff members at [employer] thought he had swelling but pt has no pain. They also noticed he not eating as much.  No fevers, no chills or sweats. The swelling has been present for 3-4 days.   Review of Systems  Constitutional: Negative for fever, chills and fatigue.  HENT:       Rt side lower jaw swelling. Over mandible area.  Respiratory: Negative for cough, choking, shortness of breath and wheezing.   Cardiovascular: Negative for chest pain and palpitations.  Gastrointestinal: Negative for abdominal pain.  Neurological: Negative for dizziness and headaches.  Hematological: Negative for adenopathy. Does not bruise/bleed easily.  Psychiatric/Behavioral: Negative for behavioral problems and confusion.     Past Medical History  Diagnosis Date  . Diabetes mellitus   . Urinary incontinence   . Mental retardation   . Schizophrenia ([place])   . BPH (benign prostatic hyperplasia)   . Thyroid disease   . Anemia   . Cataract   . Hypertension     DENIES    Social History   Social History  . Marital Status: Single    Spouse Name: N/A  . Number of Children: N/A  . Years of Education: N/A   Occupational History  . Not on file.   Social History Main Topics  . Smoking status: Never Smoker   . Smokeless tobacco: Current User    Types: Chew     Comment: chews tobacco  . Alcohol Use: No  . Drug Use: No  . Sexual Activity: Not on file   Other Topics Concern  . Not on file   Social History Narrative   Lives in a group home   Sister Edward [name] is his legal guardian.   Sister and her family live locally    Past Surgical History  Procedure Laterality Date  . Prostate surgery      x2, ?procedure    Family History  Problem Relation Age of Onset  . Arthritis Mother   . Arrhythmia Mother   . Lymphoma Mother    non-hodgkins  . Kidney disease Mother   . Heart disease Father   . Stroke Father     No Known Allergies  Current Outpatient Prescriptions on File Prior to Visit  Medication Sig Dispense Refill  . Acetaminophen (ACETAMINOPHEN EXTRA STRENGTH) 167 MG/5ML LIQD Take 15 mLs by mouth.    Marland Kitchen aspirin 81 MG tablet Take 81 mg by mouth daily.    . Blood Glucose Monitoring Suppl (ACCU-CHEK AVIVA PLUS) W/DEVICE KIT DX E11.9 1 kit 0  . clotrimazole-betamethasone (LOTRISONE) cream APPLY 1 APPLICATION TOPICALLY TWICE A DAY 45 g 3  . Control Gel Formula Dressing (DUODERM CGF DRESSING) MISC Apply 1 each topically every 3 (three) days. 20 each 0  . desonide (DESOWEN) 0.05 % lotion Apply 1 application topically 3 (three) times a week.    . diphenhydrAMINE (BENADRYL) 25 mg capsule Take 2 capsules (50 mg total) by mouth at bedtime as needed for sleep. 60 capsule 3  . dutasteride (AVODART) 0.5 MG capsule Take 1 capsule (0.5 mg total) by mouth daily. 30 capsule 5  . Emollient (CERAVE) CREA Apply 1 application topically. For dry skin on feet and legs    . ferrous sulfate 325 (65 FE) MG tablet Take 1 tablet (325  mg total) by mouth 2 (two) times daily with a meal. 60 tablet 5  . FLUoxetine (PROZAC) 20 MG capsule Take 10 mg by mouth daily.     . fluticasone (FLONASE) 50 MCG/ACT nasal spray Place 2 sprays into both nostrils daily. 16 g 1  . glucose blood (ACCU-CHEK AVIVA) test strip Use as instructed to check blood sugar once a day.  DX  E11.9 100 each 1  . ketoconazole (NIZORAL) 2 % cream Apply 1 application topically 2 (two) times daily. For face    . Lancet Devices (ACCU-CHEK SOFTCLIX) lancets Use as instructed  DX E11.9 10 each 1  . levothyroxine (SYNTHROID, LEVOTHROID) 100 MCG tablet Take 1 tablet (100 mcg total) by mouth daily. 30 tablet 5  . lip balm (BLISTEX) OINT Apply 1 application topically 4 (four) times daily as needed for lip care.    . Loperamide HCl (IMODIUM A-D PO) Take by mouth as needed.    .  Nutritional Supplements (ENSURE ACTIVE) LIQD Take 1 Can by mouth 3 (three) times daily. 90 Bottle 2  . polyethylene glycol powder (GLYCOLAX/MIRALAX) powder 1 packet in 8 oz of water or juice once daily AS NEEDED for constipation 3350 g 1  . QUEtiapine (SEROQUEL) 50 MG tablet Take 50 mg by mouth 2 (two) times daily.    . Simethicone (MYLANTA GAS PO) Take by mouth as needed.    . tamsulosin (FLOMAX) 0.4 MG CAPS capsule Take 1 capsule (0.4 mg total) by mouth at bedtime. 30 capsule 5  . thioridazine (MELLARIL) 100 MG tablet Take 200 mg by mouth at bedtime.     Marland Kitchen thioridazine (MELLARIL) 50 MG tablet Take 1 tablet (50 mg total) by mouth every morning. 30 tablet 10   No current facility-administered medications on file prior to visit.    BP 110/70 mmHg  Pulse 92  Temp(Src) 97.8 F (36.6 C) (Oral)  Resp 16  Ht _0  (1.753 m)  Wt 171 lb 9.6 oz (77.837 kg)  BMI 25.33 kg/m2  SpO2 98%       Objective:   Physical Exam  General  Mental Status - Alert. General Appearance - Well groomed. Not in acute distress.  Skin Rashes- No Rashes.  HEENT Head- Normal. Ear Auditory Canal - Left- Normal. Right - Normal.Tympanic Membrane- Left- Normal. Right- Normal. Eye Sclera/Conjunctiva- Left- Normal. Right- Normal. Nose & Sinuses Nasal Mucosa- Left-  Not oggy or Congested. Right-  Not  boggy or Congested. Mouth & Throat Lips: Upper Lip- Normal: no dryness, cracking, pallor, cyanosis, or vesicular eruption. Lower Lip-Normal: no dryness, cracking, pallor, cyanosis or vesicular eruption. Buccal Mucosa- Bilateral- No Aphthous ulcers. Oropharynx- No Discharge or Erythema. Tonsils: Characteristics- Bilateral- No Erythema or Congestion. Size/Enlargement- Bilateral- No enlargement. Discharge- bilateral-None.  Rt side lower mandible mild swelling and rt submandbular node mild swollen. Floor of mouth not swollen. Very few teeth present. About 5 in all. Very poor dentition.  Neck Neck- Supple. No  Masses.   Chest and Lung Exam Auscultation: Breath Sounds:- even and unlabored  Cardiovascular Auscultation:Rythm- Regular, rate and rhythm. Murmurs & Other Heart Sounds:Ausculatation of the heart reveal- No Murmurs.  Lymphatic Head & Neck General Head & Neck Lymphatics: Bilateral: Description- No Localized lymphadenopathy.       Assessment & Plan:  For your rt side jaw region swelling will rx augmentin antibiotic.   Get cbc today.   Follow up in 7 days as needed  I will give Mr. Saintjean med list to pcp so she can  reconcile the lists.

## 2015-07-24 NOTE — Progress Notes (Signed)
Pre visit review using our clinic review tool, if applicable. No additional management support is needed unless otherwise documented below in the visit note. 

## 2015-07-24 NOTE — Patient Instructions (Addendum)
For your rt side jaw region swelling will rx augmentin antibiotic.   Get cbc today.   Follow up in 7 days as needed  I will give Edward [name] med list to pcp so she can reconcile the lists.

## 2015-07-25 NOTE — Telephone Encounter (Signed)
I saw pt the other day. Delray Alt who works at [employer] had paperwork to fill out. Your name is on forms to sign. She pointed out that our med list and their med list had different instructions. She pointed this out. I will put this in your red folder. If you have any questions let me know.

## 2015-07-29 ENCOUNTER — Telehealth: Payer: Self-pay | Admitting: Family

## 2015-07-29 ENCOUNTER — Other Ambulatory Visit: Payer: Self-pay | Admitting: Family

## 2015-07-29 MED ORDER — KETOCONAZOLE 2 % EX CREA: 1.0000 "application " | TOPICAL_CREAM | Freq: Two times a day (BID) | CUTANEOUS | Status: AC

## 2015-07-29 NOTE — Telephone Encounter (Signed)
Reviewed med record from his home.  Is the staff still needing to use duoderm to his sacral area?

## 2015-07-31 ENCOUNTER — Telehealth: Payer: Self-pay | Admitting: Family

## 2015-07-31 MED ORDER — TAMSULOSIN HCL 0.4 MG PO CAPS: 0.4000 mg | ORAL_CAPSULE | Freq: Every day | ORAL | Status: AC

## 2015-07-31 NOTE — Telephone Encounter (Signed)
Called guardian per DPR.  Left a message for call back.

## 2015-07-31 NOTE — Telephone Encounter (Addendum)
Last filled: 02/03/15 Amt: 30, 5 Last OV: 05/20/15 with PCP Next OV: 08/01/15 and 08/20/15 Med filled: 30, 2.

## 2015-07-31 NOTE — Telephone Encounter (Signed)
Caller name: Butch Penny from "[facility]" (bad connection)  Call back number: [phone]   Reason for call:   Staff from group home stated at last OV with Percell Miller 07/24/2015 advised NP was on vacation and when returned NP will fill medication, requesting a refill tamsulosin (FLOMAX) 0.4 MG CAPS capsule. Patient is completely out please advise

## 2015-08-01 ENCOUNTER — Encounter: Payer: Self-pay | Admitting: Medical

## 2015-08-01 ENCOUNTER — Ambulatory Visit (INDEPENDENT_AMBULATORY_CARE_PROVIDER_SITE_OTHER): Payer: Medicare Other | Admitting: Medical

## 2015-08-01 VITALS — BP 110/70 | HR 83 | Temp 97.9°F | Ht 69.0 in | Wt 170.8 lb

## 2015-08-01 DIAGNOSIS — Z862 Personal history of diseases of the blood and blood-forming organs and certain disorders involving the immune mechanism: Secondary | ICD-10-CM | POA: Diagnosis not present

## 2015-08-01 DIAGNOSIS — R591 Generalized enlarged lymph nodes: Secondary | ICD-10-CM

## 2015-08-01 DIAGNOSIS — R220 Localized swelling, mass and lump, head: Secondary | ICD-10-CM

## 2015-08-01 DIAGNOSIS — M2669 Other specified disorders of temporomandibular joint: Secondary | ICD-10-CM | POA: Diagnosis not present

## 2015-08-01 DIAGNOSIS — R599 Enlarged lymph nodes, unspecified: Secondary | ICD-10-CM | POA: Diagnosis not present

## 2015-08-01 LAB — CBC WITH DIFFERENTIAL/PLATELET
Basophils Absolute: 0.1 10*3/uL (ref 0.0–0.1)
Basophils Relative: 1 % (ref 0–1)
Eosinophils Absolute: 1.1 10*3/uL — ABNORMAL HIGH (ref 0.0–0.7)
Eosinophils Relative: 13 % — ABNORMAL HIGH (ref 0–5)
HEMATOCRIT: 37.6 % — AB (ref 39.0–52.0)
HEMOGLOBIN: 13.3 g/dL (ref 13.0–17.0)
LYMPHS PCT: 15 % (ref 12–46)
Lymphs Abs: 1.2 10*3/uL (ref 0.7–4.0)
MCH: 32.4 pg (ref 26.0–34.0)
MCHC: 35.4 g/dL (ref 30.0–36.0)
MCV: 91.7 fL (ref 78.0–100.0)
MONO ABS: 0.6 10*3/uL (ref 0.1–1.0)
MONOS PCT: 8 % (ref 3–12)
MPV: 8.7 fL (ref 8.6–12.4)
NEUTROS ABS: 5.1 10*3/uL (ref 1.7–7.7)
Neutrophils Relative %: 63 % (ref 43–77)
Platelets: 160 10*3/uL (ref 150–400)
RBC: 4.1 MIL/uL — ABNORMAL LOW (ref 4.22–5.81)
RDW: 13.6 % (ref 11.5–15.5)
WBC: 8.1 10*3/uL (ref 4.0–10.5)

## 2015-08-01 LAB — IRON: IRON: 26 ug/dL — AB (ref 50–180)

## 2015-08-01 NOTE — Telephone Encounter (Signed)
Spoke to sister.  She says that he has several prescriptions for several creams, but does not think he has a duoderm on her sacrum.  She says that patient has an appt today (today 08/01/15 @ 1:15 pm with Elise Benne), we could ask the group home then.

## 2015-08-01 NOTE — Telephone Encounter (Signed)
Pt caregiver states that he does not use the patches anymore. She states that he has not used them for over a month.

## 2015-08-01 NOTE — Addendum Note (Signed)
Addended by: Harl Bowie on: 08/01/2015 05:49 PM   Modules accepted: Orders

## 2015-08-01 NOTE — Progress Notes (Signed)
Subjective:    Patient ID: Edward [name], male    DOB: [birth year], 70 y.o.   MRN: [id number]  HPI  Pt in with some rt lower jaw area mild swollen. Pt has no pain. Staff members at [employer] thought he had swelling but pt has no pain. They also noticed he not eating as much.  No fevers, no chills or sweats. The swelling has been present for 3-4 days.  Above hpi from last visit.  Pt got better 4 days after use of the antibiotic. No problems with med. No further jaw swelling now.   Review of Systems  Constitutional: Negative for fever, chills and fatigue.  HENT: Negative for congestion, facial swelling, nosebleeds, sinus pressure, sneezing and sore throat.        Resolved jaw swelling.  Respiratory: Negative for cough, chest tightness, shortness of breath and wheezing.   Cardiovascular: Negative for chest pain and palpitations.  Gastrointestinal: Negative for abdominal pain.  Skin: Negative for rash.  Neurological: Negative for dizziness and headaches.  Hematological: Negative for adenopathy. Does not bruise/bleed easily.  Psychiatric/Behavioral: Negative for behavioral problems and confusion.     Past Medical History  Diagnosis Date  . Diabetes mellitus   . Urinary incontinence   . Mental retardation   . Schizophrenia ([place])   . BPH (benign prostatic hyperplasia)   . Thyroid disease   . Anemia   . Cataract   . Hypertension     DENIES    Social History   Social History  . Marital Status: Single    Spouse Name: N/A  . Number of Children: N/A  . Years of Education: N/A   Occupational History  . Not on file.   Social History Main Topics  . Smoking status: Never Smoker   . Smokeless tobacco: Current User    Types: Chew     Comment: chews tobacco  . Alcohol Use: No  . Drug Use: No  . Sexual Activity: Not on file   Other Topics Concern  . Not on file   Social History Narrative   Lives in a group home   Sister [name] is his legal guardian.   Sister and  her family live locally    Past Surgical History  Procedure Laterality Date  . Prostate surgery      x2, ?procedure    Family History  Problem Relation Age of Onset  . Arthritis Mother   . Arrhythmia Mother   . Lymphoma Mother     non-hodgkins  . Kidney disease Mother   . Heart disease Father   . Stroke Father     No Known Allergies  Current Outpatient Prescriptions on File Prior to Visit  Medication Sig Dispense Refill  . Acetaminophen (ACETAMINOPHEN EXTRA STRENGTH) 167 MG/5ML LIQD Take 15 mLs by mouth.    Marland Kitchen aspirin 81 MG tablet Take 81 mg by mouth daily.    . Blood Glucose Monitoring Suppl (ACCU-CHEK AVIVA PLUS) W/DEVICE KIT DX E11.9 1 kit 0  . Control Gel Formula Dressing (DUODERM CGF DRESSING) MISC Apply 1 each topically every 3 (three) days. 20 each 0  . diphenhydrAMINE (BENADRYL) 25 mg capsule Take 2 capsules (50 mg total) by mouth at bedtime as needed for sleep. 60 capsule 3  . dutasteride (AVODART) 0.5 MG capsule Take 1 capsule (0.5 mg total) by mouth daily. 30 capsule 5  . Emollient (CERAVE) CREA Apply 1 application topically. For dry skin on feet and legs    . ferrous sulfate  325 (65 FE) MG tablet Take 1 tablet (325 mg total) by mouth 2 (two) times daily with a meal. 60 tablet 5  . FLUoxetine (PROZAC) 20 MG capsule Take 10 mg by mouth daily.     . fluticasone (FLONASE) 50 MCG/ACT nasal spray Place 2 sprays into both nostrils daily. 16 g 1  . glucose blood (ACCU-CHEK AVIVA) test strip Use as instructed to check blood sugar once a day.  DX  E11.9 100 each 1  . ketoconazole (NIZORAL) 2 % cream Apply 1 application topically 2 (two) times daily. 3 days a week, Monday, Wednesday and friday 15 g   . Lancet Devices (ACCU-CHEK SOFTCLIX) lancets Use as instructed  DX E11.9 10 each 1  . levothyroxine (SYNTHROID, LEVOTHROID) 100 MCG tablet Take 1 tablet (100 mcg total) by mouth daily. 30 tablet 5  . lip balm (BLISTEX) OINT Apply 1 application topically 4 (four) times daily as  needed for lip care.    . Loperamide HCl (IMODIUM A-D PO) Take by mouth as needed.    . Nutritional Supplements (ENSURE ACTIVE) LIQD Take 1 Can by mouth 3 (three) times daily. 90 Bottle 2  . polyethylene glycol powder (GLYCOLAX/MIRALAX) powder 1 packet in 8 oz of water or juice once daily AS NEEDED for constipation 3350 g 1  . QUEtiapine (SEROQUEL) 50 MG tablet Take 50 mg by mouth 2 (two) times daily.    . Simethicone (MYLANTA GAS PO) Take by mouth as needed.    . tamsulosin (FLOMAX) 0.4 MG CAPS capsule Take 1 capsule (0.4 mg total) by mouth at bedtime. 30 capsule 2  . thioridazine (MELLARIL) 100 MG tablet Take 200 mg by mouth at bedtime.     . thioridazine (MELLARIL) 50 MG tablet Take 1 tablet (50 mg total) by mouth every morning. 30 tablet 10   No current facility-administered medications on file prior to visit.    BP 110/70 mmHg  Pulse 83  Temp(Src) 97.9 F (36.6 C) (Oral)  Ht 5' 9" (1.753 m)  Wt 170 lb 12.8 oz (77.474 kg)  BMI 25.21 kg/m2  SpO2 97%       Objective:   Physical Exam  General  Mental Status - Alert. General Appearance - Well groomed. Not in acute distress.  Skin Rashes- No Rashes.  HEENT Head- Normal. Ear Auditory Canal - Left- Normal. Right - Normal.Tympanic Membrane- Left- Normal. Right- Normal. Eye Sclera/Conjunctiva- Left- Normal. Right- Normal. Nose & Sinuses Nasal Mucosa- Left-  Not boggy or Congested. Right-  Not  boggy or Congested. Mouth & Throat Lips: Upper Lip- Normal: no dryness, cracking, pallor, cyanosis, or vesicular eruption. Lower Lip-Normal: no dryness, cracking, pallor, cyanosis or vesicular eruption. Buccal Mucosa- Bilateral- No Aphthous ulcers. Oropharynx- No Discharge or Erythema. Tonsils: Characteristics- Bilateral- No Erythema or Congestion. Size/Enlargement- Bilateral- No enlargement. Discharge- bilateral-None.  Rt jaw normal now. No swelling and no regional lymph node enlarged.  Neck Neck- Supple. No Masses.   Chest and  Lung Exam Auscultation: Breath Sounds:- even and unlabored.  Cardiovascular Auscultation:Rythm- Regular, rate and rhythm. Murmurs & Other Heart Sounds:Ausculatation of the heart reveal- No Murmurs.  Lymphatic Head & Neck General Head & Neck Lymphatics: Bilateral: Description- No Localized lymphadenopathy.       Assessment & Plan:  Your jaw swelling appears resolved. Continue last 3 days or antibiotic. Still see dentist to evaluate teeth as possible cause.  For hx of anemia get cbc anc fe level  Follow up as regularly scheduled with pcp or as needed 

## 2015-08-01 NOTE — Telephone Encounter (Signed)
Barnet Pall-- can you ask pt's caregivers when he comes for his appt with Percell Miller today if they are still needing to use duoderm on pt's sacrum for skin ulcer?  Thanks!

## 2015-08-01 NOTE — Progress Notes (Signed)
Pre visit review using our clinic review tool, if applicable. No additional management support is needed unless otherwise documented below in the visit note. 

## 2015-08-01 NOTE — Patient Instructions (Signed)
Your jaw swelling appears resolved. Continue last 3 days or antibiotic. Still see dentist to evaluate teeth as possible cause.  For hx of anemia get cbc anc fe level  Follow up as regularly scheduled with pcp or as needed

## 2015-08-04 ENCOUNTER — Telehealth: Payer: Self-pay | Admitting: Family

## 2015-08-04 NOTE — Telephone Encounter (Addendum)
Caller name: Butch Penny  Relation to pt: [place]  Call back number: [phone]    Reason for call:  Patient missed for a month and in need of clinical advice

## 2015-08-05 NOTE — Telephone Encounter (Signed)
Per verbal from PCP, she feels that pt's anemia was diet related at his prior facility and pt should proceed with once daily iron supplement as recent CBC showed normal hgb. Attempted to reach Butch Penny at below # and line is busy.  Will try again later.

## 2015-08-05 NOTE — Telephone Encounter (Signed)
Attempted to reach Butch Penny, very poor connection. Was told that Butch Penny was not there and am unable to leave a message. Will try back tomorrow.
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
## 2015-08-06 ENCOUNTER — Ambulatory Visit (INDEPENDENT_AMBULATORY_CARE_PROVIDER_SITE_OTHER): Payer: Medicare Other | Admitting: Cardiovascular Disease

## 2015-08-06 ENCOUNTER — Encounter: Payer: Self-pay | Admitting: Cardiovascular Disease

## 2015-08-06 VITALS — BP 108/64 | HR 88 | Ht 69.0 in | Wt 171.0 lb

## 2015-08-06 DIAGNOSIS — Z79899 Other long term (current) drug therapy: Secondary | ICD-10-CM

## 2015-08-06 DIAGNOSIS — R9431 Abnormal electrocardiogram [ECG] [EKG]: Secondary | ICD-10-CM

## 2015-08-06 DIAGNOSIS — N401 Enlarged prostate with lower urinary tract symptoms: Secondary | ICD-10-CM | POA: Diagnosis not present

## 2015-08-06 DIAGNOSIS — R338 Other retention of urine: Secondary | ICD-10-CM | POA: Diagnosis not present

## 2015-08-06 DIAGNOSIS — I4581 Long QT syndrome: Secondary | ICD-10-CM

## 2015-08-06 NOTE — Patient Instructions (Addendum)
Please have labs done - BMP,MAGNESIUM LEVEL  WILL CALL YOU WITH RESULTS   NO CHANGE WITH CURRENT MEDICATIONS AT PRESENT TIME.  Your physician wants you to follow-up in [place]. You will receive a reminder letter in the mail two months in advance. If you don't receive a letter, please call our office to schedule the follow-up appointment.   Please avoid other medications that could further prolong the QTc, such as antibiotics, antifungals, benadryl, zofran, or antiarrhythmics

## 2015-08-06 NOTE — Telephone Encounter (Signed)
Notified Butch Penny and she voices understanding. She requests written order be faxed to to below phone # as well as a copy of labs from 12/29 and 08/01/15. Labs and copy of below note with PCP signature faxed to below #.

## 2015-08-07 ENCOUNTER — Telehealth: Payer: Self-pay | Admitting: Family

## 2015-08-07 NOTE — Telephone Encounter (Signed)
Caller name: Cammie Sickle  Can be reached: [phone]   Reason for call:  Group home called stating that patient was seen by Cardiologist 1/11 and ordered to get Magnesium, and BMP ASAP. Would like to come 1/13. Plse adv

## 2015-08-08 NOTE — Telephone Encounter (Signed)
Left detailed message on Donna's voicemail that we are not able to draw labs for doctor's outside of [place] office and to contact cardiology for labs.

## 2015-08-20 ENCOUNTER — Encounter: Payer: Self-pay | Admitting: Family

## 2015-08-20 ENCOUNTER — Ambulatory Visit (INDEPENDENT_AMBULATORY_CARE_PROVIDER_SITE_OTHER): Payer: Medicare Other | Admitting: Family

## 2015-08-20 ENCOUNTER — Other Ambulatory Visit (INDEPENDENT_AMBULATORY_CARE_PROVIDER_SITE_OTHER): Payer: Medicare Other

## 2015-08-20 ENCOUNTER — Telehealth: Payer: Self-pay | Admitting: Family

## 2015-08-20 VITALS — BP 123/65 | HR 98 | Temp 98.4°F | Resp 16 | Ht 69.0 in | Wt 173.0 lb

## 2015-08-20 DIAGNOSIS — E039 Hypothyroidism, unspecified: Secondary | ICD-10-CM

## 2015-08-20 DIAGNOSIS — E781 Pure hyperglyceridemia: Secondary | ICD-10-CM | POA: Diagnosis not present

## 2015-08-20 DIAGNOSIS — E119 Type 2 diabetes mellitus without complications: Secondary | ICD-10-CM

## 2015-08-20 DIAGNOSIS — Z79899 Other long term (current) drug therapy: Secondary | ICD-10-CM

## 2015-08-20 DIAGNOSIS — K5909 Other constipation: Secondary | ICD-10-CM | POA: Diagnosis not present

## 2015-08-20 DIAGNOSIS — E785 Hyperlipidemia, unspecified: Secondary | ICD-10-CM

## 2015-08-20 LAB — BASIC METABOLIC PANEL
BUN: 15 mg/dL (ref 6–23)
CHLORIDE: 104 meq/L (ref 96–112)
CO2: 29 mEq/L (ref 19–32)
CREATININE: 0.98 mg/dL (ref 0.40–1.50)
Calcium: 8.8 mg/dL (ref 8.4–10.5)
GFR: 79.91 mL/min (ref >60.00)
Glucose, Bld: 187 mg/dL — ABNORMAL HIGH (ref 70–99)
Potassium: 4 mEq/L (ref 3.5–5.1)
Sodium: 140 mEq/L (ref 135–145)

## 2015-08-20 LAB — LIPID PANEL
CHOL/HDL RATIO: 3
Cholesterol: 116 mg/dL (ref 0–200)
HDL: 34.3 mg/dL — AB (ref >39.00)
LDL Cholesterol: 62 mg/dL (ref 0–99)
NONHDL: 81.72
Triglycerides: 100 mg/dL (ref 0.0–149.0)
VLDL: 20 mg/dL (ref 0.0–40.0)

## 2015-08-20 LAB — MAGNESIUM: MAGNESIUM: 2.1 mg/dL (ref 1.5–2.5)

## 2015-08-20 NOTE — Assessment & Plan Note (Signed)
Advised caregiver to encourage increased water intake and to use miralax prn.

## 2015-08-20 NOTE — Patient Instructions (Addendum)
Please complete lab work prior to leaving.   

## 2015-08-20 NOTE — Telephone Encounter (Signed)
Caller name: [name] Relationship to patient: Sister Can be reached:[phone]   Reason for call: Sister Scientist, product/process development) called back after appt stating that they [place] patient lives in needs BMP and a Mag labs added to labs drawn this morning

## 2015-08-20 NOTE — Progress Notes (Signed)
Pre visit review using our clinic review tool, if applicable. No additional management support is needed unless otherwise documented below in the visit note. 

## 2015-08-20 NOTE — Progress Notes (Signed)
Subjective:    Patient ID: Edward [name], male    DOB: Nov 28, 1945, 70 y.o.   MRN: [id number]  HPI  Edward [name] is a 70 yr old male who presents today for follow up.  1) HTN- Pt is currently diet controlled only.  BP Readings from Last 3 Encounters:  08/20/15 123/65  08/06/15 108/64  08/01/15 110/70   2) Hypothyroid- Pt is maintained on synthroid.  Overall weight is stable. Wt Readings from Last 3 Encounters:  08/20/15 173 lb (78.472 kg)  08/06/15 171 lb (77.565 kg)  08/01/15 170 lb 12.8 oz (77.474 kg)    Lab Results  Component Value Date   TSH 2.35 05/20/2015   3) DM2-  Sugars have been very well controlled since he lost weight. Lab Results  Component Value Date   HGBA1C 5.4 05/20/2015   HGBA1C 5.1 01/15/2015   HGBA1C 5.4 08/21/2014   Lab Results  Component Value Date   LDLCALC 76 08/24/2012   CREATININE 1.06 05/20/2015   4) ? Constipation- pt reports feeling like he always needs to have a BM. Caregiver reports patient had a large hard stool yesterday.  They have not used miralax recently.  Pt only drinks 2 cups of water a day.  No longer needing ensure.   Review of Systems See HPI  Past Medical History  Diagnosis Date  . Diabetes mellitus   . Urinary incontinence   . Mental retardation   . Schizophrenia ([place])   . BPH (benign prostatic hyperplasia)   . Thyroid disease   . Anemia   . Cataract   . Hypertension     DENIES    Social History   Social History  . Marital Status: Single    Spouse Name: N/A  . Number of Children: N/A  . Years of Education: N/A   Occupational History  . Not on file.   Social History Main Topics  . Smoking status: Never Smoker   . Smokeless tobacco: Current User    Types: Chew     Comment: chews tobacco  . Alcohol Use: No  . Drug Use: No  . Sexual Activity: Not on file   Other Topics Concern  . Not on file   Social History Narrative   Lives in a group home   Sister [name] is his legal guardian.   Sister and  her family live locally    Past Surgical History  Procedure Laterality Date  . Prostate surgery      x2, ?procedure    Family History  Problem Relation Age of Onset  . Arthritis Mother   . Arrhythmia Mother   . Lymphoma Mother     non-hodgkins  . Kidney disease Mother   . Heart disease Father   . Stroke Father     No Known Allergies  Current Outpatient Prescriptions on File Prior to Visit  Medication Sig Dispense Refill  . Acetaminophen (ACETAMINOPHEN EXTRA STRENGTH) 167 MG/5ML LIQD Take 15 mLs by mouth.    Marland Kitchen aspirin 81 MG tablet Take 81 mg by mouth daily.    . Blood Glucose Monitoring Suppl (ACCU-CHEK AVIVA PLUS) W/DEVICE KIT DX E11.9 1 kit 0  . Control Gel Formula Dressing (DUODERM CGF DRESSING) MISC Apply 1 each topically every 3 (three) days. 20 each 0  . diphenhydrAMINE (BENADRYL) 25 mg capsule Take 2 capsules (50 mg total) by mouth at bedtime as needed for sleep. 60 capsule 3  . dutasteride (AVODART) 0.5 MG capsule Take 1 capsule (0.5 mg  total) by mouth daily. 30 capsule 5  . Emollient (CERAVE) CREA Apply 1 application topically. For dry skin on feet and legs    . ferrous sulfate 325 (65 FE) MG tablet Take 1 tablet (325 mg total) by mouth 2 (two) times daily with a meal. 60 tablet 5  . FLUoxetine (PROZAC) 20 MG capsule Take 10 mg by mouth daily.     Marland Kitchen glucose blood (ACCU-CHEK AVIVA) test strip Use as instructed to check blood sugar once a day.  DX  E11.9 100 each 1  . ketoconazole (NIZORAL) 2 % cream Apply 1 application topically 2 (two) times daily. 3 days a week, Monday, Wednesday and friday 15 g   . Lancet Devices (ACCU-CHEK SOFTCLIX) lancets Use as instructed  DX E11.9 10 each 1  . levothyroxine (SYNTHROID, LEVOTHROID) 100 MCG tablet Take 1 tablet (100 mcg total) by mouth daily. 30 tablet 5  . lip balm (BLISTEX) OINT Apply 1 application topically 4 (four) times daily as needed for lip care.    . Loperamide HCl (IMODIUM A-D PO) Take by mouth as needed.    .  polyethylene glycol powder (GLYCOLAX/MIRALAX) powder 1 packet in 8 oz of water or juice once daily AS NEEDED for constipation 3350 g 1  . QUEtiapine (SEROQUEL) 50 MG tablet Take 50 mg by mouth 2 (two) times daily.    . Simethicone (MYLANTA GAS PO) Take by mouth as needed.    . tamsulosin (FLOMAX) 0.4 MG CAPS capsule Take 1 capsule (0.4 mg total) by mouth at bedtime. 30 capsule 2  . thioridazine (MELLARIL) 100 MG tablet Take 200 mg by mouth at bedtime.     Marland Kitchen thioridazine (MELLARIL) 50 MG tablet Take 1 tablet (50 mg total) by mouth every morning. 30 tablet 10   No current facility-administered medications on file prior to visit.    BP 123/65 mmHg  Pulse 98  Temp(Src) 98.4 F (36.9 C) (Oral)  Resp 16  Ht _0  (1.753 m)  Wt 173 lb (78.472 kg)  BMI 25.54 kg/m2  SpO2 100%        Objective:   Physical Exam  Constitutional: He is oriented to person, place, and time. He appears well-developed and well-nourished. No distress.  HENT:  Head: Normocephalic and atraumatic.  Cardiovascular: Normal rate and regular rhythm.   No murmur heard. Pulmonary/Chest: Effort normal and breath sounds normal. No respiratory distress. He has no wheezes. He has no rales.  Neurological: He is alert and oriented to person, place, and time.  Skin: Skin is warm and dry.          Assessment & Plan:

## 2015-08-20 NOTE — Telephone Encounter (Signed)
Bmet ordered by PCP today. It appears that magnesium level was ordered by cardiology on 08/06/15 but not completed. Per verbal from PCP, ok to add on magnesium and we will need to forward to Dr Oval Linsey (cardiologist).

## 2015-08-20 NOTE — Assessment & Plan Note (Signed)
Stable with diet only.  Will attempt to obtain urine microalbumin.

## 2015-08-20 NOTE — Assessment & Plan Note (Signed)
Not on statin, will obtain lipid panel

## 2015-08-20 NOTE — Assessment & Plan Note (Addendum)
Stable, continue synthroid

## 2015-08-21 ENCOUNTER — Encounter: Payer: Self-pay | Admitting: Family

## 2015-08-22 NOTE — Telephone Encounter (Signed)
Labs faxed to cardiologist below.

## 2015-08-26 ENCOUNTER — Telehealth: Payer: Self-pay | Admitting: *Deleted

## 2015-08-26 NOTE — Telephone Encounter (Signed)
-----   Message from Skeet Latch, MD sent at 08/25/2015  4:54 PM EST ----- Please let Edward [name] know that I reviewed his labs and they all look good.  HDL is low, so please increase exercise to at least 30 minutes most days of the week.  TCR

## 2015-08-26 NOTE — Telephone Encounter (Signed)
Spoke to patient's sister Golda Acre ( legal guardian).  labsResult given . Verbalized understanding

## 2015-09-16 DIAGNOSIS — F70 Mild intellectual disabilities: Secondary | ICD-10-CM | POA: Diagnosis not present

## 2015-09-16 DIAGNOSIS — F209 Schizophrenia, unspecified: Secondary | ICD-10-CM | POA: Diagnosis not present

## 2015-09-17 ENCOUNTER — Telehealth: Payer: Self-pay | Admitting: *Deleted

## 2015-09-17 DIAGNOSIS — N281 Cyst of kidney, acquired: Secondary | ICD-10-CM | POA: Diagnosis not present

## 2015-09-17 DIAGNOSIS — N390 Urinary tract infection, site not specified: Secondary | ICD-10-CM | POA: Diagnosis not present

## 2015-09-17 DIAGNOSIS — N401 Enlarged prostate with lower urinary tract symptoms: Secondary | ICD-10-CM | POA: Diagnosis not present

## 2015-09-17 DIAGNOSIS — N139 Obstructive and reflux uropathy, unspecified: Secondary | ICD-10-CM | POA: Diagnosis not present

## 2015-09-17 DIAGNOSIS — Z0000 Encounter for general adult medical examination without abnormal findings: Secondary | ICD-10-CM | POA: Diagnosis not present

## 2015-09-17 NOTE — Telephone Encounter (Signed)
Received fax from [facility] requesting we fax copy of pt's lab results from 08/20/15 to [id number]. Results faxed.

## 2015-09-18 DIAGNOSIS — H524 Presbyopia: Secondary | ICD-10-CM | POA: Diagnosis not present

## 2015-09-18 DIAGNOSIS — H2513 Age-related nuclear cataract, bilateral: Secondary | ICD-10-CM | POA: Diagnosis not present

## 2015-09-18 DIAGNOSIS — E119 Type 2 diabetes mellitus without complications: Secondary | ICD-10-CM | POA: Diagnosis not present

## 2015-09-18 DIAGNOSIS — H43393 Other vitreous opacities, bilateral: Secondary | ICD-10-CM | POA: Diagnosis not present

## 2015-10-29 DIAGNOSIS — R338 Other retention of urine: Secondary | ICD-10-CM | POA: Diagnosis not present

## 2015-10-29 DIAGNOSIS — N401 Enlarged prostate with lower urinary tract symptoms: Secondary | ICD-10-CM | POA: Diagnosis not present

## 2015-12-09 DIAGNOSIS — F70 Mild intellectual disabilities: Secondary | ICD-10-CM | POA: Diagnosis not present

## 2015-12-09 DIAGNOSIS — F209 Schizophrenia, unspecified: Secondary | ICD-10-CM | POA: Diagnosis not present

## 2015-12-10 DIAGNOSIS — N390 Urinary tract infection, site not specified: Secondary | ICD-10-CM | POA: Diagnosis not present

## 2015-12-10 DIAGNOSIS — R338 Other retention of urine: Secondary | ICD-10-CM | POA: Diagnosis not present

## 2015-12-10 DIAGNOSIS — N139 Obstructive and reflux uropathy, unspecified: Secondary | ICD-10-CM | POA: Diagnosis not present

## 2015-12-10 DIAGNOSIS — Z0000 Encounter for general adult medical examination without abnormal findings: Secondary | ICD-10-CM | POA: Diagnosis not present

## 2015-12-15 ENCOUNTER — Ambulatory Visit (INDEPENDENT_AMBULATORY_CARE_PROVIDER_SITE_OTHER): Payer: Medicare Other | Admitting: Family

## 2015-12-15 ENCOUNTER — Encounter: Payer: Self-pay | Admitting: Family

## 2015-12-15 VITALS — BP 117/62 | HR 90 | Temp 98.4°F | Resp 16 | Ht 69.0 in | Wt 159.6 lb

## 2015-12-15 DIAGNOSIS — E119 Type 2 diabetes mellitus without complications: Secondary | ICD-10-CM | POA: Diagnosis not present

## 2015-12-15 DIAGNOSIS — T148XXA Other injury of unspecified body region, initial encounter: Secondary | ICD-10-CM

## 2015-12-15 DIAGNOSIS — E039 Hypothyroidism, unspecified: Secondary | ICD-10-CM | POA: Diagnosis not present

## 2015-12-15 DIAGNOSIS — R634 Abnormal weight loss: Secondary | ICD-10-CM | POA: Diagnosis not present

## 2015-12-15 DIAGNOSIS — T148 Other injury of unspecified body region: Secondary | ICD-10-CM

## 2015-12-15 LAB — COMPREHENSIVE METABOLIC PANEL
ALK PHOS: 86 U/L (ref 39–117)
ALT: 13 U/L (ref 0–53)
AST: 18 U/L (ref 0–37)
Albumin: 3 g/dL — ABNORMAL LOW (ref 3.5–5.2)
BILIRUBIN TOTAL: 0.3 mg/dL (ref 0.2–1.2)
BUN: 18 mg/dL (ref 6–23)
CO2: 29 mEq/L (ref 19–32)
Calcium: 8.4 mg/dL (ref 8.4–10.5)
Chloride: 105 mEq/L (ref 96–112)
Creatinine, Ser: 1.39 mg/dL (ref 0.40–1.50)
GFR: 53.34 mL/min — AB (ref >60.00)
GLUCOSE: 170 mg/dL — AB (ref 70–99)
Potassium: 3.5 mEq/L (ref 3.5–5.1)
Sodium: 139 mEq/L (ref 135–145)
TOTAL PROTEIN: 6.8 g/dL (ref 6.0–8.3)

## 2015-12-15 LAB — CBC WITH DIFFERENTIAL/PLATELET
BASOS ABS: 0 10*3/uL (ref 0.0–0.1)
Basophils Relative: 0.5 % (ref 0.0–3.0)
EOS ABS: 0.2 10*3/uL (ref 0.0–0.7)
Eosinophils Relative: 2.9 % (ref 0.0–5.0)
HCT: 31.9 % — ABNORMAL LOW (ref 39.0–52.0)
Hemoglobin: 10.5 g/dL — ABNORMAL LOW (ref 13.0–17.0)
LYMPHS ABS: 1.2 10*3/uL (ref 0.7–4.0)
Lymphocytes Relative: 16.1 % (ref 12.0–46.0)
MCHC: 32.8 g/dL (ref 30.0–36.0)
MCV: 89.1 fl (ref 78.0–100.0)
MONO ABS: 0.6 10*3/uL (ref 0.1–1.0)
MONOS PCT: 7.8 % (ref 3.0–12.0)
NEUTROS ABS: 5.5 10*3/uL (ref 1.4–7.7)
NEUTROS PCT: 72.7 % (ref 43.0–77.0)
PLATELETS: 412 10*3/uL — AB (ref 150.0–400.0)
RBC: 3.58 Mil/uL — AB (ref 4.22–5.81)
RDW: 14.4 % (ref 11.5–15.5)
WBC: 7.6 10*3/uL (ref 4.0–10.5)

## 2015-12-15 LAB — HEMOGLOBIN A1C: Hgb A1c MFr Bld: 5.8 % (ref 4.6–6.5)

## 2015-12-15 LAB — TSH: TSH: 2.83 u[IU]/mL (ref 0.35–4.50)

## 2015-12-15 NOTE — Assessment & Plan Note (Signed)
I don't see reason to continue to check his blood sugars routinely at the home since A1C has been so good. Will d/c finger sticks.

## 2015-12-15 NOTE — Progress Notes (Signed)
Subjective:    Patient ID: DIMETRI ARMITAGE, male    DOB: 01-21-1946, 70 y.o.   MRN: [id number]  HPI  Mr. Rounds is a 70 yr old male who presents today for follow up of multiple medical problems. He is accompanied by his sister and his caregiver from the group home.   1) Hypothyroid- continues synthroid. Lab Results  Component Value Date   TSH 2.35 05/20/2015   2) DM2- diet controlled. Home has been checking sugars 2-3 times daily Lab Results  Component Value Date   HGBA1C 5.4 05/20/2015   HGBA1C 5.1 01/15/2015   HGBA1C 5.4 08/21/2014   Lab Results  Component Value Date   LDLCALC 62 08/20/2015   CREATININE 0.98 08/20/2015   3) Weight loss- pt has dropped a considerable amount of weight since last visit. Caregiver notes that patient's appetite is good at some times, other times he does not eat much. Perhaps more active since he has been in this new home.  Wt Readings from Last 3 Encounters:  12/15/15 159 lb 9.6 oz (72.394 kg)  08/20/15 173 lb (78.472 kg)  08/06/15 171 lb (77.565 kg)   4) Bruising- bilateral arms, not new.    Review of Systems Past Medical History  Diagnosis Date  . Diabetes mellitus   . Urinary incontinence   . Mental retardation   . Schizophrenia ([place])   . BPH (benign prostatic hyperplasia)   . Thyroid disease   . Anemia   . Cataract   . Hypertension     DENIES     Social History   Social History  . Marital Status: Single    Spouse Name: N/A  . Number of Children: N/A  . Years of Education: N/A   Occupational History  . Not on file.   Social History Main Topics  . Smoking status: Never Smoker   . Smokeless tobacco: Current User    Types: Chew     Comment: chews tobacco  . Alcohol Use: No  . Drug Use: No  . Sexual Activity: Not on file   Other Topics Concern  . Not on file   Social History Narrative   Lives in a group home   Sister [name] is his legal guardian.   Sister and her family live locally    Past Surgical  History  Procedure Laterality Date  . Prostate surgery      x2, ?procedure    Family History  Problem Relation Age of Onset  . Arthritis Mother   . Arrhythmia Mother   . Lymphoma Mother     non-hodgkins  . Kidney disease Mother   . Heart disease Father   . Stroke Father     No Known Allergies  Current Outpatient Prescriptions on File Prior to Visit  Medication Sig Dispense Refill  . aspirin 81 MG tablet Take 81 mg by mouth daily.    . Blood Glucose Monitoring Suppl (ACCU-CHEK AVIVA PLUS) W/DEVICE KIT DX E11.9 1 kit 0  . diphenhydrAMINE (BENADRYL) 25 mg capsule Take 2 capsules (50 mg total) by mouth at bedtime as needed for sleep. 60 capsule 3  . dutasteride (AVODART) 0.5 MG capsule Take 1 capsule (0.5 mg total) by mouth daily. 30 capsule 5  . Emollient (CERAVE) CREA Apply 1 application topically. For dry skin on feet and legs    . ferrous sulfate 325 (65 FE) MG tablet Take 1 tablet (325 mg total) by mouth 2 (two) times daily with a meal. 60 tablet 5  .  FLUoxetine (PROZAC) 20 MG capsule Take 10 mg by mouth daily.     Marland Kitchen glucose blood (ACCU-CHEK AVIVA) test strip Use as instructed to check blood sugar once a day.  DX  E11.9 100 each 1  . ketoconazole (NIZORAL) 2 % cream Apply 1 application topically 2 (two) times daily. 3 days a week, Monday, Wednesday and friday 15 g   . Lancet Devices (ACCU-CHEK SOFTCLIX) lancets Use as instructed  DX E11.9 10 each 1  . levothyroxine (SYNTHROID, LEVOTHROID) 100 MCG tablet Take 1 tablet (100 mcg total) by mouth daily. 30 tablet 5  . lip balm (BLISTEX) OINT Apply 1 application topically 4 (four) times daily as needed for lip care.    . Loperamide HCl (IMODIUM A-D PO) Take by mouth as needed.    . polyethylene glycol powder (GLYCOLAX/MIRALAX) powder 1 packet in 8 oz of water or juice once daily AS NEEDED for constipation 3350 g 1  . QUEtiapine (SEROQUEL) 50 MG tablet Take 50 mg by mouth 2 (two) times daily.    . Simethicone (MYLANTA GAS PO) Take by  mouth as needed.    . tamsulosin (FLOMAX) 0.4 MG CAPS capsule Take 1 capsule (0.4 mg total) by mouth at bedtime. 30 capsule 2  . thioridazine (MELLARIL) 100 MG tablet Take 200 mg by mouth at bedtime.     Marland Kitchen thioridazine (MELLARIL) 50 MG tablet Take 1 tablet (50 mg total) by mouth every morning. 30 tablet 10  . Acetaminophen (ACETAMINOPHEN EXTRA STRENGTH) 167 MG/5ML LIQD Take 15 mLs by mouth. Reported on 12/15/2015     No current facility-administered medications on file prior to visit.    BP 117/62 mmHg  Pulse 90  Temp(Src) 98.4 F (36.9 C) (Oral)  Resp 16  Ht 5' 9"  (1.753 m)  Wt 159 lb 9.6 oz (72.394 kg)  BMI 23.56 kg/m2  SpO2 100%       Objective:   Physical Exam  Constitutional: He appears well-developed and well-nourished. No distress.  HENT:  Head: Normocephalic and atraumatic.  Cardiovascular: Normal rate and regular rhythm.   No murmur heard. Pulmonary/Chest: Effort normal and breath sounds normal. No respiratory distress. He has no wheezes. He has no rales.  Musculoskeletal: He exhibits no edema.  Neurological: He is alert.  Skin: Skin is warm and dry.  + ecchymosis noted bilateral forearms  Psychiatric: He has a normal mood and affect. His behavior is normal. Thought content normal.          Assessment & Plan:  Weight loss- obtain TSH, CMET, CBC. Add [employer] brand ensure. Follow up in 1 month for weight recheck. If not stabilized, will plan additional work up at that time.   Bruising- likely due to ASA, obtain cbc, reassurance provided.

## 2015-12-15 NOTE — Patient Instructions (Signed)
Please complete lab work prior to leaving.  Add one can of [employer] Complete Nutritional Shake twice daily. Stop blood sugar checks.  Weigh weekly. Call if > 1 pound of weight loss in 1 week.

## 2015-12-15 NOTE — Progress Notes (Signed)
Pre visit review using our clinic review tool, if applicable. No additional management support is needed unless otherwise documented below in the visit note. 

## 2015-12-15 NOTE — Assessment & Plan Note (Signed)
Obtain follow up TSH, continue synthroid.

## 2015-12-15 NOTE — Addendum Note (Signed)
Addended by: Debbrah Alar on: 12/15/2015 02:30 PM   Modules accepted: Miquel Dunn

## 2015-12-18 ENCOUNTER — Other Ambulatory Visit (INDEPENDENT_AMBULATORY_CARE_PROVIDER_SITE_OTHER): Payer: Medicare Other

## 2015-12-18 DIAGNOSIS — D649 Anemia, unspecified: Secondary | ICD-10-CM | POA: Diagnosis not present

## 2015-12-18 LAB — IRON: Iron: 10 ug/dL — ABNORMAL LOW (ref 42–165)

## 2015-12-19 ENCOUNTER — Telehealth: Payer: Self-pay | Admitting: Family

## 2015-12-19 NOTE — Telephone Encounter (Signed)
Notified pt's sister and Caregiver Butch Penny [phone]).  Butch Penny confirmed that pt is taking iron twice a day, they will proceed with ensure supplementation. Butch Penny states they do not have anyone at the group home that can assist patient with collecting the IFOB samples. Notified pt's sister and she is unsure how to proceed.  Melissa, please advise of other alternative / recommendation?

## 2015-12-19 NOTE — Telephone Encounter (Signed)
[name] caregiver, Butch Penny called back stating pt has only been taking his iron supplement once a day and they will need a doctor's order faxed to them for twice daily dosing at [phone]. Please advise re: hematology referral and IFOB problem as stated below.

## 2015-12-19 NOTE — Telephone Encounter (Signed)
Please let pt's sister know that he is again anemic. Is he taking iron supplement bid, if not needs to restart.  If he is taking iron supplement already, then we should refer to hematology.  His protein level is a bit low in his blood. I think that this is most likely diet related. I think that adding the [employer]co ensure is a good idea. Sugar and thyroid look good.  Also, I would like him to complete an IFOB please.

## 2015-12-19 NOTE — Telephone Encounter (Signed)
I will plan a rectal exam at his follow up in June.  Increase iron to twice daily.  Will hold off on hematology referral.

## 2015-12-23 NOTE — Telephone Encounter (Signed)
Order written and forwarded to PCP to sign and fax to the group home. Left detailed message on pt's sister's ([name]) voicemail re: below recommendation and left detailed message on caregiver, Donna's voicemail to call with fax # for medication order.

## 2015-12-24 MED ORDER — FERROUS SULFATE 325 (65 FE) MG PO TABS: 325.0000 mg | ORAL_TABLET | Freq: Two times a day (BID) | ORAL | Status: DC

## 2015-12-24 NOTE — Telephone Encounter (Signed)
Form / order faxed to below #.

## 2015-12-24 NOTE — Telephone Encounter (Signed)
Butch Penny called with Fax# [phone], please send RX for iron pill 2x day.

## 2016-01-02 ENCOUNTER — Encounter: Payer: Self-pay | Admitting: Family

## 2016-01-02 ENCOUNTER — Ambulatory Visit (INDEPENDENT_AMBULATORY_CARE_PROVIDER_SITE_OTHER): Payer: Medicare Other | Admitting: Family

## 2016-01-02 VITALS — BP 105/81 | HR 68 | Temp 98.4°F | Resp 16 | Ht 69.0 in | Wt 162.8 lb

## 2016-01-02 DIAGNOSIS — R634 Abnormal weight loss: Secondary | ICD-10-CM | POA: Diagnosis not present

## 2016-01-02 DIAGNOSIS — B029 Zoster without complications: Secondary | ICD-10-CM

## 2016-01-02 DIAGNOSIS — L989 Disorder of the skin and subcutaneous tissue, unspecified: Secondary | ICD-10-CM | POA: Diagnosis not present

## 2016-01-02 MED ORDER — VALACYCLOVIR HCL 1 G PO TABS: 1000.0000 mg | ORAL_TABLET | Freq: Three times a day (TID) | ORAL | Status: DC

## 2016-01-02 NOTE — Patient Instructions (Addendum)
Start valtex for shingles. You will be contacted about your referral to dermatology.  Please let us know if you have not heard back within 1 week about your referral. Follow up as scheduled. Call sooner if increased pain, redness, drainage or if fever.

## 2016-01-02 NOTE — Progress Notes (Signed)
Subjective:    Patient ID: Edward [name], male    DOB: 03-Apr-1946, 70 y.o.   MRN: [id number]  HPI  [name] is a 70 yr old male who presents today with several concerns.  1) Rash- noted on abdomen and back. Has been present x 2 days. Rash is pruritic. Pt denies pain.   2) Nevus- notes lesion left temple x 1 year per sister.   Wt Readings from Last 3 Encounters:  01/02/16 162 lb 12.8 oz (72.846 kg)  12/15/15 159 lb 9.6 oz (71.394 kg)  08/20/15 173 lb (77.472 kg)   3) Weight loss- caregiver notes good appetite. He has gained 3 pounds.    Review of Systems    see HPI  Past Medical History  Diagnosis Date  . Diabetes mellitus   . Urinary incontinence   . Mental retardation   . Schizophrenia ([place])   . BPH (benign prostatic hyperplasia)   . Thyroid disease   . Anemia   . Cataract   . Hypertension     DENIES     Social History   Social History  . Marital Status: Single    Spouse Name: N/A  . Number of Children: N/A  . Years of Education: N/A   Occupational History  . Not on file.   Social History Main Topics  . Smoking status: Never Smoker   . Smokeless tobacco: Current User    Types: Chew     Comment: chews tobacco  . Alcohol Use: No  . Drug Use: No  . Sexual Activity: Not on file   Other Topics Concern  . Not on file   Social History Narrative   Lives in a group home   Sister Edward [name] Bury is his legal guardian.   Sister and her family live locally    Past Surgical History  Procedure Laterality Date  . Prostate surgery      x2, ?procedure    Family History  Problem Relation Age of Onset  . Arthritis Mother   . Arrhythmia Mother   . Lymphoma Mother     non-hodgkins  . Kidney disease Mother   . Heart disease Father   . Stroke Father     No Known Allergies  Current Outpatient Prescriptions on File Prior to Visit  Medication Sig Dispense Refill  . aspirin 81 MG tablet Take 81 mg by mouth daily.    . Blood Glucose Monitoring Suppl  (ACCU-CHEK AVIVA PLUS) W/DEVICE KIT DX E11.9 1 kit 0  . dutasteride (AVODART) 0.5 MG capsule Take 1 capsule (0.5 mg total) by mouth daily. 30 capsule 5  . Emollient (CERAVE) CREA Apply 1 application topically. For dry skin on feet and legs    . ferrous sulfate 325 (65 FE) MG tablet Take 1 tablet (325 mg total) by mouth 2 (two) times daily with a meal. 60 tablet 5  . FLUoxetine (PROZAC) 20 MG capsule Take 10 mg by mouth daily.     Marland Kitchen glucose blood (ACCU-CHEK AVIVA) test strip Use as instructed to check blood sugar once a day.  DX  E11.9 100 each 1  . ketoconazole (NIZORAL) 2 % cream Apply 1 application topically 2 (two) times daily. 3 days a week, Monday, Wednesday and friday 15 g   . Lancet Devices (ACCU-CHEK SOFTCLIX) lancets Use as instructed  DX E11.9 10 each 1  . levothyroxine (SYNTHROID, LEVOTHROID) 100 MCG tablet Take 1 tablet (100 mcg total) by mouth daily. 30 tablet 5  . Loperamide HCl (  IMODIUM A-D PO) Take by mouth as needed.    . polyethylene glycol powder (GLYCOLAX/MIRALAX) powder 1 packet in 8 oz of water or juice once daily AS NEEDED for constipation 3350 g 1  . QUEtiapine (SEROQUEL) 50 MG tablet Take 50 mg by mouth 2 (two) times daily.    . Simethicone (MYLANTA GAS PO) Take by mouth as needed.    . tamsulosin (FLOMAX) 0.4 MG CAPS capsule Take 1 capsule (0.4 mg total) by mouth at bedtime. 30 capsule 2  . thioridazine (MELLARIL) 100 MG tablet Take 200 mg by mouth at bedtime.     Marland Kitchen thioridazine (MELLARIL) 50 MG tablet Take 1 tablet (50 mg total) by mouth every morning. 30 tablet 10  . lip balm (BLISTEX) OINT Apply 1 application topically 4 (four) times daily as needed for lip care.     No current facility-administered medications on file prior to visit.    BP 105/81 mmHg  Pulse 68  Temp(Src) 98.4 F (36.9 C) (Oral)  Resp 16  Ht 5' 9"  (1.753 m)  Wt 162 lb 12.8 oz (73.846 kg)  BMI 24.03 kg/m2  SpO2 100%    Objective:   Physical Exam  Constitutional: He appears  well-developed and well-nourished. No distress.  Skin:     Approximately 63m wide hyperpigmented lesion left temple  Blistered lesions noted as documented.  Some of the blisters are confluent into larger bullous type blisters            Assessment & Plan:  Herpes zoster- will rx with valtrex.  Thankfully he is not experiencing pain even though the rash looks extremely painful.  Sister and caregiver from the home are instructed to call if increased pain, redness, drainage or if fever.   Weight loss- improving.  They will continue to weigh the patient.  Skin lesion- forehead- refer to derm for excision.

## 2016-01-12 ENCOUNTER — Ambulatory Visit: Payer: Medicare Other | Admitting: Family

## 2016-01-13 ENCOUNTER — Telehealth: Payer: Self-pay | Admitting: Family

## 2016-01-13 NOTE — Telephone Encounter (Signed)
Group home brings patient to appts. Edward [name] they had an emergency with another resident which caused them to miss pt's recent appt.  Please advise?

## 2016-01-13 NOTE — Telephone Encounter (Signed)
No charge. 

## 2016-01-13 NOTE — Telephone Encounter (Signed)
Patient was No Show on 6/19. Last No Showed on 08/19/14  Charge or No Charge?

## 2016-01-14 ENCOUNTER — Encounter: Payer: Self-pay | Admitting: Family

## 2016-01-15 ENCOUNTER — Encounter: Payer: Self-pay | Admitting: Family

## 2016-01-15 ENCOUNTER — Ambulatory Visit (INDEPENDENT_AMBULATORY_CARE_PROVIDER_SITE_OTHER): Payer: Medicare Other | Admitting: Family

## 2016-01-15 VITALS — BP 116/80 | HR 83 | Temp 98.8°F | Resp 18 | Ht 69.0 in | Wt 169.0 lb

## 2016-01-15 DIAGNOSIS — B029 Zoster without complications: Secondary | ICD-10-CM | POA: Diagnosis not present

## 2016-01-15 DIAGNOSIS — R634 Abnormal weight loss: Secondary | ICD-10-CM | POA: Diagnosis not present

## 2016-01-15 NOTE — Progress Notes (Signed)
Subjective:    Patient ID: Edward [name], male    DOB: Oct 13, 1945, 70 y.o.   MRN: [id number]  HPI  Mr. Visconti is a 70 yr old male who presents today for follow up on his shingles.  He denies pain or itching.   Weight loss- Weight is up 7 pounds since our last visit.  Wt Readings from Last 3 Encounters:  01/15/16 169 lb (76.658 kg)  01/02/16 162 lb 12.8 oz (73.846 kg)  12/15/15 159 lb 9.6 oz (72.394 kg)     Review of Systems    see HPI  Past Medical History  Diagnosis Date  . Diabetes mellitus   . Urinary incontinence   . Mental retardation   . Schizophrenia ([place])   . BPH (benign prostatic hyperplasia)   . Thyroid disease   . Anemia   . Cataract   . Hypertension     DENIES     Social History   Social History  . Marital Status: Single    Spouse Name: N/A  . Number of Children: N/A  . Years of Education: N/A   Occupational History  . Not on file.   Social History Main Topics  . Smoking status: Never Smoker   . Smokeless tobacco: Current User    Types: Chew     Comment: chews tobacco  . Alcohol Use: No  . Drug Use: No  . Sexual Activity: Not on file   Other Topics Concern  . Not on file   Social History Narrative   Lives in a group home   Sister [name] is his legal guardian.   Sister and her family live locally    Past Surgical History  Procedure Laterality Date  . Prostate surgery      x2, ?procedure    Family History  Problem Relation Age of Onset  . Arthritis Mother   . Arrhythmia Mother   . Lymphoma Mother     non-hodgkins  . Kidney disease Mother   . Heart disease Father   . Stroke Father     No Known Allergies  Current Outpatient Prescriptions on File Prior to Visit  Medication Sig Dispense Refill  . aspirin 81 MG tablet Take 81 mg by mouth daily.    . Blood Glucose Monitoring Suppl (ACCU-CHEK AVIVA PLUS) W/DEVICE KIT DX E11.9 1 kit 0  . dutasteride (AVODART) 0.5 MG capsule Take 1 capsule (0.5 mg total) by mouth daily. 30  capsule 5  . Emollient (CERAVE) CREA Apply 1 application topically. For dry skin on feet and legs    . ferrous sulfate 325 (65 FE) MG tablet Take 1 tablet (325 mg total) by mouth 2 (two) times daily with a meal. 60 tablet 5  . FLUoxetine (PROZAC) 20 MG capsule Take 10 mg by mouth daily.     Marland Kitchen glucose blood (ACCU-CHEK AVIVA) test strip Use as instructed to check blood sugar once a day.  DX  E11.9 100 each 1  . ketoconazole (NIZORAL) 2 % cream Apply 1 application topically 2 (two) times daily. 3 days a week, Monday, Wednesday and friday 15 g   . Lancet Devices (ACCU-CHEK SOFTCLIX) lancets Use as instructed  DX E11.9 10 each 1  . levothyroxine (SYNTHROID, LEVOTHROID) 100 MCG tablet Take 1 tablet (100 mcg total) by mouth daily. 30 tablet 5  . lip balm (BLISTEX) OINT Apply 1 application topically 4 (four) times daily as needed for lip care.    . Loperamide HCl (IMODIUM A-D PO) Take  by mouth as needed.    . polyethylene glycol powder (GLYCOLAX/MIRALAX) powder 1 packet in 8 oz of water or juice once daily AS NEEDED for constipation 3350 g 1  . QUEtiapine (SEROQUEL) 50 MG tablet Take 50 mg by mouth 2 (two) times daily.    . Simethicone (MYLANTA GAS PO) Take by mouth as needed.    . tamsulosin (FLOMAX) 0.4 MG CAPS capsule Take 1 capsule (0.4 mg total) by mouth at bedtime. 30 capsule 2  . thioridazine (MELLARIL) 100 MG tablet Take 200 mg by mouth at bedtime.     Marland Kitchen thioridazine (MELLARIL) 50 MG tablet Take 1 tablet (50 mg total) by mouth every morning. 30 tablet 10   No current facility-administered medications on file prior to visit.    BP 116/80 mmHg  Pulse 83  Temp(Src) 98.8 F (37.1 C) (Oral)  Resp 18  Ht _0  (1.753 m)  Wt 169 lb (76.658 kg)  BMI 24.95 kg/m2  SpO2 99%    Objective:   Physical Exam  Constitutional: He is oriented to person, place, and time. He appears well-developed and well-nourished. No distress.  HENT:  Head: Normocephalic and atraumatic.  Cardiovascular: Normal  rate and regular rhythm.   No murmur heard. Pulmonary/Chest: Effort normal and breath sounds normal. No respiratory distress. He has no wheezes. He has no rales.  Musculoskeletal: He exhibits no edema.  Neurological: He is alert and oriented to person, place, and time.  Skin: Skin is warm and dry.  Shingles lesions are now dry and scabbed.   Psychiatric: He has a normal mood and affect. His behavior is normal. Thought content normal.          Assessment & Plan:  Shingles- resolving nicely. He denies pain or itching.  Weight loss- weight is now trending up. Advised caregiver:

## 2016-01-15 NOTE — Progress Notes (Signed)
Pre visit review using our clinic review tool, if applicable. No additional management support is needed unless otherwise documented below in the visit note. 

## 2016-01-15 NOTE — Patient Instructions (Addendum)
Decrease nutritional supplement to one can a day. Weigh monthly and call if patient gains/loses 3 or more pounds.

## 2016-01-15 NOTE — Assessment & Plan Note (Signed)
Decrease nutritional supplement to one can a day. Weigh monthly and call if patient gains/loses 3 or more pounds.

## 2016-01-16 DIAGNOSIS — N401 Enlarged prostate with lower urinary tract symptoms: Secondary | ICD-10-CM | POA: Diagnosis not present

## 2016-01-16 DIAGNOSIS — R338 Other retention of urine: Secondary | ICD-10-CM | POA: Diagnosis not present

## 2016-01-29 ENCOUNTER — Telehealth: Payer: Self-pay

## 2016-01-29 MED ORDER — NUTRITIONAL SHAKE PLUS PO LIQD: ORAL | Status: DC

## 2016-01-29 NOTE — Telephone Encounter (Signed)
Received fax from Charlett Lango at [phone] for Nutritional Supplement chocolate shakes 1 shake daily to be sent to [facility]. Okay per Melissa to send Rx. [facility] informed that Rx has been sent to [facility].

## 2016-02-03 ENCOUNTER — Ambulatory Visit (INDEPENDENT_AMBULATORY_CARE_PROVIDER_SITE_OTHER): Payer: Medicare Other | Admitting: Family

## 2016-02-03 VITALS — BP 120/58 | HR 80 | Temp 98.1°F | Ht 72.0 in | Wt 168.2 lb

## 2016-02-03 DIAGNOSIS — N3000 Acute cystitis without hematuria: Secondary | ICD-10-CM | POA: Diagnosis not present

## 2016-02-03 MED ORDER — CIPROFLOXACIN HCL 250 MG PO TABS: 250.0000 mg | ORAL_TABLET | Freq: Two times a day (BID) | ORAL | Status: DC

## 2016-02-03 NOTE — Patient Instructions (Signed)
Please begin cipro 250mg  twice daily for urinary tract infection. Let us know if urinary odor does not improve, if recurrent low back pain or fever. Keep upcoming appointment with urology.

## 2016-02-03 NOTE — Progress Notes (Signed)
Pre visit review using our clinic review tool, if applicable. No additional management support is needed unless otherwise documented below in the visit note. 

## 2016-02-03 NOTE — Progress Notes (Signed)
Subjective:    Patient ID: Edward [name], male    DOB: 02-29-1946, 70 y.o.   MRN: [id number]  HPI  Edward [name] is a 70 yr old male who presents today with c/o [place] odor.  The patient is brought here by his sister and caregiver from his group home.  The caregiver first notice urinary odor on 01/29/16.  They report that the patient has fecal incontinence.  They try to keep him as clean as possible but fear that this may be contributing to his risk or UTI.  Patient is scheduled to see the urologist every 6 weeks.  He has BPH.  No known fever.  No hematuria.  Pt denies dysuria.    Review of Systems See HPI  Past Medical History  Diagnosis Date  . Diabetes mellitus   . Urinary incontinence   . Mental retardation   . Schizophrenia ([place])   . BPH (benign prostatic hyperplasia)   . Thyroid disease   . Anemia   . Cataract   . Hypertension     DENIES     Social History   Social History  . Marital Status: Single    Spouse Name: N/A  . Number of Children: N/A  . Years of Education: N/A   Occupational History  . Not on file.   Social History Main Topics  . Smoking status: Never Smoker   . Smokeless tobacco: Current User    Types: Chew     Comment: chews tobacco  . Alcohol Use: No  . Drug Use: No  . Sexual Activity: Not on file   Other Topics Concern  . Not on file   Social History Narrative   Lives in a group home   Sister [name] is his legal guardian.   Sister and her family live locally    Past Surgical History  Procedure Laterality Date  . Prostate surgery      x2, ?procedure    Family History  Problem Relation Age of Onset  . Arthritis Mother   . Arrhythmia Mother   . Lymphoma Mother     non-hodgkins  . Kidney disease Mother   . Heart disease Father   . Stroke Father     No Known Allergies  Current Outpatient Prescriptions on File Prior to Visit  Medication Sig Dispense Refill  . aspirin 81 MG tablet Take 81 mg by mouth daily.    . Blood Glucose  Monitoring Suppl (ACCU-CHEK AVIVA PLUS) W/DEVICE KIT DX E11.9 1 kit 0  . dutasteride (AVODART) 0.5 MG capsule Take 1 capsule (0.5 mg total) by mouth daily. 30 capsule 5  . Emollient (CERAVE) CREA Apply 1 application topically. For dry skin on feet and legs    . ferrous sulfate 325 (65 FE) MG tablet Take 1 tablet (325 mg total) by mouth 2 (two) times daily with a meal. 60 tablet 5  . glucose blood (ACCU-CHEK AVIVA) test strip Use as instructed to check blood sugar once a day.  DX  E11.9 100 each 1  . ketoconazole (NIZORAL) 2 % cream Apply 1 application topically 2 (two) times daily. 3 days a week, Monday, Wednesday and friday 15 g   . Lancet Devices (ACCU-CHEK SOFTCLIX) lancets Use as instructed  DX E11.9 10 each 1  . levothyroxine (SYNTHROID, LEVOTHROID) 100 MCG tablet Take 1 tablet (100 mcg total) by mouth daily. 30 tablet 5  . lip balm (BLISTEX) OINT Apply 1 application topically 4 (four) times daily as needed for lip  care.    . Loperamide HCl (IMODIUM A-D PO) Take by mouth as needed.    . Nutritional Supplements (NUTRITIONAL SHAKE PLUS) LIQD Drink 1 shake by mouth daily. 30 Bottle 5  . polyethylene glycol powder (GLYCOLAX/MIRALAX) powder 1 packet in 8 oz of water or juice once daily AS NEEDED for constipation 3350 g 1  . QUEtiapine (SEROQUEL) 50 MG tablet Take 50 mg by mouth 2 (two) times daily.    . Simethicone (MYLANTA GAS PO) Take by mouth as needed.    . tamsulosin (FLOMAX) 0.4 MG CAPS capsule Take 1 capsule (0.4 mg total) by mouth at bedtime. 30 capsule 2  . thioridazine (MELLARIL) 100 MG tablet Take 200 mg by mouth at bedtime.     Marland Kitchen thioridazine (MELLARIL) 50 MG tablet Take 1 tablet (50 mg total) by mouth every morning. 30 tablet 10   No current facility-administered medications on file prior to visit.    BP 120/58 mmHg  Pulse 80  Temp(Src) 98.1 F (36.7 C) (Oral)  Ht 6' (1.829 m)  Wt 168 lb 3.2 oz (76.295 kg)  BMI 22.81 kg/m2  SpO2 98%       Objective:   Physical Exam    Constitutional: He is oriented to person, place, and time. He appears well-developed and well-nourished. No distress.  HENT:  Head: Normocephalic and atraumatic.  Cardiovascular: Normal rate and regular rhythm.   No murmur heard. Pulmonary/Chest: Effort normal and breath sounds normal. No respiratory distress. He has no wheezes. He has no rales.  Abdominal: Soft. He exhibits no distension. There is no tenderness.  Musculoskeletal: He exhibits no edema.  Neurological: He is alert and oriented to person, place, and time.  Skin: Skin is warm and dry.  Psychiatric: He has a normal mood and affect. His behavior is normal. Thought content normal.  GU: neg CVAT bilaterally        Assessment & Plan:  UTI- pt unable to provide urine sample.  Sister reports that he only tolerates catheterization if he has lidocaine gel application prior to catheterization.  We do not have that here in the clinic. Will plan empiric rx with cipro. If symptoms worsen or do not improve, he will need to follow back up with Urology so that they can obtain a sterile urine sample for culture.  Sister and caregiver are aware and agree with plan.

## 2016-02-10 ENCOUNTER — Emergency Department (HOSPITAL_BASED_OUTPATIENT_CLINIC_OR_DEPARTMENT_OTHER): Payer: Medicare Other

## 2016-02-10 ENCOUNTER — Encounter (HOSPITAL_BASED_OUTPATIENT_CLINIC_OR_DEPARTMENT_OTHER): Payer: Self-pay | Admitting: Emergency Medicine

## 2016-02-10 ENCOUNTER — Emergency Department (HOSPITAL_BASED_OUTPATIENT_CLINIC_OR_DEPARTMENT_OTHER)
Admission: EM | Admit: 2016-02-10 | Discharge: 2016-02-10 | Disposition: A | Discharge status: Home / Self Care | Payer: Medicare Other | Attending: Emergency Medicine | Admitting: Emergency Medicine

## 2016-02-10 DIAGNOSIS — W010XXA Fall on same level from slipping, tripping and stumbling without subsequent striking against object, initial encounter: Secondary | ICD-10-CM | POA: Insufficient documentation

## 2016-02-10 DIAGNOSIS — Y939 Activity, unspecified: Secondary | ICD-10-CM | POA: Insufficient documentation

## 2016-02-10 DIAGNOSIS — F209 Schizophrenia, unspecified: Secondary | ICD-10-CM | POA: Insufficient documentation

## 2016-02-10 DIAGNOSIS — Y92009 Unspecified place in unspecified non-institutional (private) residence as the place of occurrence of the external cause: Secondary | ICD-10-CM | POA: Insufficient documentation

## 2016-02-10 DIAGNOSIS — S0990XA Unspecified injury of head, initial encounter: Secondary | ICD-10-CM

## 2016-02-10 DIAGNOSIS — S0081XA Abrasion of other part of head, initial encounter: Secondary | ICD-10-CM | POA: Insufficient documentation

## 2016-02-10 DIAGNOSIS — I10 Essential (primary) hypertension: Secondary | ICD-10-CM | POA: Diagnosis not present

## 2016-02-10 DIAGNOSIS — E119 Type 2 diabetes mellitus without complications: Secondary | ICD-10-CM | POA: Insufficient documentation

## 2016-02-10 DIAGNOSIS — F17220 Nicotine dependence, chewing tobacco, uncomplicated: Secondary | ICD-10-CM | POA: Diagnosis not present

## 2016-02-10 DIAGNOSIS — W19XXXA Unspecified fall, initial encounter: Secondary | ICD-10-CM

## 2016-02-10 DIAGNOSIS — Y999 Unspecified external cause status: Secondary | ICD-10-CM | POA: Diagnosis not present

## 2016-02-10 NOTE — ED Notes (Signed)
Pt comes into the ED via POV c/o a fall that took place earlier today.  Patient present with abrasion above the right eye and denies any LOC.  Patient resides at [facility] group home and comes with his group home manager and specialist.  Patient denies any pain at this time and appears in NAD with even and unlabored respirations.  Patient is recently getting over a UTI and he is currently completing his antibiotics.

## 2016-02-10 NOTE — Discharge Instructions (Signed)
You were seen and evaluated today following her fall. It does not appear that you have any treatment injuries to your brain or head. Please follow-up with your primary care physician for reevaluation. Please return for fever, gait instability, changes in behavior or neurologic function.  Head Injury, Adult You have received a head injury. It does not appear serious at this time. Headaches and vomiting are common following head injury. It should be easy to awaken from sleeping. Sometimes it is necessary for you to stay in the emergency department for a while for observation. Sometimes admission to the hospital may be needed. After injuries such as yours, most problems occur within the first 24 hours, but side effects may occur up to 7-10 days after the injury. It is important for you to carefully monitor your condition and contact your health care provider or seek immediate medical care if there is a change in your condition. WHAT ARE THE TYPES OF HEAD INJURIES? Head injuries can be as minor as a bump. Some head injuries can be more severe. More severe head injuries include:  A jarring injury to the brain (concussion).  A bruise of the brain (contusion). This mean there is bleeding in the brain that can cause swelling.  A cracked skull (skull fracture).  Bleeding in the brain that collects, clots, and forms a bump (hematoma). WHAT CAUSES A HEAD INJURY? A serious head injury is most likely to happen to someone who is in a car wreck and is not wearing a seat belt. Other causes of major head injuries include bicycle or motorcycle accidents, sports injuries, and falls. HOW ARE HEAD INJURIES DIAGNOSED? A complete history of the event leading to the injury and your current symptoms will be helpful in diagnosing head injuries. Many times, pictures of the brain, such as CT or MRI are needed to see the extent of the injury. Often, an overnight hospital stay is necessary for observation.  WHEN SHOULD I SEEK  IMMEDIATE MEDICAL CARE?  You should get help right away if:  You have confusion or drowsiness.  You feel sick to your stomach (nauseous) or have continued, forceful vomiting.  You have dizziness or unsteadiness that is getting worse.  You have severe, continued headaches not relieved by medicine. Only take over-the-counter or prescription medicines for pain, fever, or discomfort as directed by your health care provider.  You do not have normal function of the arms or legs or are unable to walk.  You notice changes in the black spots in the center of the colored part of your eye (pupil).  You have a clear or bloody fluid coming from your nose or ears.  You have a loss of vision. During the next 24 hours after the injury, you must stay with someone who can watch you for the warning signs. This person should contact local emergency services (911 in the [place] if you have seizures, you become unconscious, or you are unable to wake up. HOW CAN I PREVENT A HEAD INJURY IN THE FUTURE? The most important factor for preventing major head injuries is avoiding motor vehicle accidents. To minimize the potential for damage to your head, it is crucial to wear seat belts while riding in motor vehicles. Wearing helmets while bike riding and playing collision sports (like football) is also helpful. Also, avoiding dangerous activities around the house will further help reduce your risk of head injury.  WHEN CAN I RETURN TO NORMAL ACTIVITIES AND ATHLETICS? You should be reevaluated by your health  care provider before returning to these activities. If you have any of the following symptoms, you should not return to activities or contact sports until 1 week after the symptoms have stopped:  Persistent headache.  Dizziness or vertigo.  Poor attention and concentration.  Confusion.  Memory problems.  Nausea or vomiting.  Fatigue or tire easily.  Irritability.  Intolerant of bright lights or loud  noises.  Anxiety or depression.  Disturbed sleep. MAKE SURE YOU:   Understand these instructions.  Will watch your condition.  Will get help right away if you are not doing well or get worse.   This information is not intended to replace advice given to you by your health care provider. Make sure you discuss any questions you have with your health care provider.   Document Released: 07/12/2005 Document Revised: 08/02/2014 Document Reviewed: 03/19/2013 Elsevier Interactive Patient Education 2016 [place] in the Home  Falls can cause injuries and can affect people from all age groups. There are many simple things that you can do to make your home safe and to help prevent falls. WHAT CAN I DO ON THE OUTSIDE OF MY HOME?  Regularly repair the edges of walkways and driveways and fix any cracks.  Remove high doorway thresholds.  Trim any shrubbery on the main path into your home.  Use bright outdoor lighting.  Clear walkways of debris and clutter, including tools and rocks.  Regularly check that handrails are securely fastened and in good repair. Both sides of any steps should have handrails.  Install guardrails along the edges of any raised decks or porches.  Have leaves, snow, and ice cleared regularly.  Use sand or salt on walkways during winter months.  In the garage, clean up any spills right away, including grease or oil spills. WHAT CAN I DO IN THE BATHROOM?  Use night lights.  Install grab bars by the toilet and in the tub and shower. Do not use towel bars as grab bars.  Use non-skid mats or decals on the floor of the tub or shower.  If you need to sit down while you are in the shower, use a plastic, non-slip stool.[employer]  Keep the floor dry. Immediately clean up any water that spills on the floor.  Remove soap buildup in the tub or shower on a regular basis.  Attach bath mats securely with double-sided non-slip rug tape.  Remove throw rugs  and other tripping hazards from the floor. WHAT CAN I DO IN THE BEDROOM?  Use night lights.  Make sure that a bedside light is easy to reach.  Do not use oversized bedding that drapes onto the floor.  Have a firm chair that has side arms to use for getting dressed.  Remove throw rugs and other tripping hazards from the floor. WHAT CAN I DO IN THE KITCHEN?   Clean up any spills right away.  Avoid walking on wet floors.  Place frequently used items in easy-to-reach places.  If you need to reach for something above you, use a sturdy step stool that has a grab bar.  Keep electrical cables out of the way.  Do not use floor polish or wax that makes floors slippery. If you have to use wax, make sure that it is non-skid floor wax.  Remove throw rugs and other tripping hazards from the floor. WHAT CAN I DO IN THE STAIRWAYS?  Do not leave any items on the stairs.  Make sure that there are handrails  on both sides of the stairs. Fix handrails that are broken or loose. Make sure that handrails are as long as the stairways.  Check any carpeting to make sure that it is firmly attached to the stairs. Fix any carpet that is loose or worn.  Avoid having throw rugs at the top or bottom of stairways, or secure the rugs with carpet tape to prevent them from moving.  Make sure that you have a light switch at the top of the stairs and the bottom of the stairs. If you do not have them, have them installed. WHAT ARE SOME OTHER FALL PREVENTION TIPS?  Wear closed-toe shoes that fit well and support your feet. Wear shoes that have rubber soles or low heels.  When you use a stepladder, make sure that it is completely opened and that the sides are firmly locked. Have someone hold the ladder while you are using it. Do not climb a closed stepladder.  Add color or contrast paint or tape to grab bars and handrails in your home. Place contrasting color strips on the first and last steps.  Use mobility  aids as needed, such as canes, walkers, scooters, and crutches.  Turn on lights if it is dark. Replace any light bulbs that burn out.  Set up furniture so that there are clear paths. Keep the furniture in the same spot.  Fix any uneven floor surfaces.  Choose a carpet design that does not hide the edge of steps of a stairway.  Be aware of any and all pets.  Review your medicines with your healthcare provider. Some medicines can cause dizziness or changes in blood pressure, which increase your risk of falling. Talk with your health care provider about other ways that you can decrease your risk of falls. This may include working with a physical therapist or trainer to improve your strength, balance, and endurance.   This information is not intended to replace advice given to you by your health care provider. Make sure you discuss any questions you have with your health care provider.   Document Released: 07/02/2002 Document Revised: 11/26/2014 Document Reviewed: 08/16/2014 Elsevier Interactive Patient Education [employer].

## 2016-02-10 NOTE — ED Notes (Signed)
Attempted to obtain urine from patient.  Unable to urinate at this time but instructed on using the urinal for specimen collection.

## 2016-02-10 NOTE — ED Provider Notes (Signed)
CSN: [id number]     Arrival date & time 02/10/16  1029 History   First MD Initiated Contact with Patient 02/10/16 1100     Chief Complaint  Patient presents with  . Fall     (Consider location/radiation/quality/duration/timing/severity/associated sxs/prior Treatment) HPI Comments: Patient is a 70 year old male with history of diabetes, mental retardation, schizophrenia who presents following fall at his group home yesterday. Patient reports that he got up in the living room and removed before he was steady, and tripped over his feet. He denies any dizziness or lightheadedness prior to falling. This fall was unwitnessed by any of the employees at the group home. The patient states he fell flat on his face and hit his head on the carpet. He does not think he lost consciousness. Patient does not seem to be sure if he has a headache or not. Patient sustained a minor abrasion which was cleaned well by group home staff. He denies pain elsewhere. Of note, patient recently was just treated for a UTI, which his case manager states often makes him "wobbly." Patient has to have a catheterization every 6 weeks at the urologist due to difficulty urinating. Patient denies any neck pain, back pain, chest pain, shortness of breath, abdominal pain.  Patient is a 70 y.o. male presenting with fall. The history is provided by the patient.  Fall Associated symptoms include headaches. Pertinent negatives include no abdominal pain, chest pain, chills, fever, nausea, neck pain, rash or vomiting.    Past Medical History  Diagnosis Date  . Diabetes mellitus   . Urinary incontinence   . Mental retardation   . Schizophrenia ([place])   . BPH (benign prostatic hyperplasia)   . Thyroid disease   . Anemia   . Cataract   . Hypertension     DENIES   Past Surgical History  Procedure Laterality Date  . Prostate surgery      x2, ?procedure   Family History  Problem Relation Age of Onset  . Arthritis Mother   .  Arrhythmia Mother   . Lymphoma Mother     non-hodgkins  . Kidney disease Mother   . Heart disease Father   . Stroke Father    Social History  Substance Use Topics  . Smoking status: Never Smoker   . Smokeless tobacco: Current User    Types: Chew     Comment: chews tobacco  . Alcohol Use: No    Review of Systems  Constitutional: Negative for fever and chills.  HENT: Negative for facial swelling.   Respiratory: Negative for shortness of breath.   Cardiovascular: Negative for chest pain.  Gastrointestinal: Negative for nausea, vomiting and abdominal pain.  Genitourinary: Negative for dysuria.  Musculoskeletal: Negative for back pain, neck pain and neck stiffness.  Skin: Negative for rash and wound.  Neurological: Positive for headaches. Negative for dizziness and light-headedness.  Psychiatric/Behavioral: The patient is not nervous/anxious.       Allergies  Review of patient's allergies indicates no known allergies.  Home Medications   Prior to Admission medications   Medication Sig Start Date End Date Taking? Authorizing Provider  aspirin 81 MG tablet Take 81 mg by mouth daily.    Historical Provider, MD  Blood Glucose Monitoring Suppl (ACCU-CHEK AVIVA PLUS) W/DEVICE KIT DX E11.9 07/15/15   Debbrah Alar, NP  ciprofloxacin (CIPRO) 250 MG tablet Take 1 tablet (250 mg total) by mouth 2 (two) times daily. 02/03/16   Debbrah Alar, NP  dutasteride (AVODART) 0.5 MG capsule Take  1 capsule (0.5 mg total) by mouth daily. 02/03/15   Debbrah Alar, NP  Emollient (CERAVE) CREA Apply 1 application topically. For dry skin on feet and legs    Historical Provider, MD  ferrous sulfate 325 (65 FE) MG tablet Take 1 tablet (325 mg total) by mouth 2 (two) times daily with a meal. 12/24/15   Debbrah Alar, NP  FLUoxetine (PROZAC) 10 MG capsule Take 1 capsule by mouth daily. 01/21/16   Historical Provider, MD  glucose blood (ACCU-CHEK AVIVA) test strip Use as instructed to check  blood sugar once a day.  DX  E11.9 07/15/15   Debbrah Alar, NP  ketoconazole (NIZORAL) 2 % cream Apply 1 application topically 2 (two) times daily. 3 days a week, Monday, Wednesday and friday 07/29/15   Debbrah Alar, NP  Lancet Devices [facility]) lancets Use as instructed  DX E11.9 07/15/15   Debbrah Alar, NP  levothyroxine (SYNTHROID, LEVOTHROID) 100 MCG tablet Take 1 tablet (100 mcg total) by mouth daily. 02/03/15   Debbrah Alar, NP  lip balm (BLISTEX) OINT Apply 1 application topically 4 (four) times daily as needed for lip care.    Historical Provider, MD  Loperamide HCl (IMODIUM A-D PO) Take by mouth as needed.    Historical Provider, MD  Nutritional Supplements (NUTRITIONAL SHAKE PLUS) LIQD Drink 1 shake by mouth daily. 01/29/16   Debbrah Alar, NP  polyethylene glycol powder (GLYCOLAX/MIRALAX) powder 1 packet in 8 oz of water or juice once daily AS NEEDED for constipation 10/15/14   Debbrah Alar, NP  QUEtiapine (SEROQUEL) 50 MG tablet Take 50 mg by mouth 2 (two) times daily.    Historical Provider, MD  Simethicone (MYLANTA GAS PO) Take by mouth as needed.    Historical Provider, MD  tamsulosin (FLOMAX) 0.4 MG CAPS capsule Take 1 capsule (0.4 mg total) by mouth at bedtime. 07/31/15   Debbrah Alar, NP  thioridazine (MELLARIL) 100 MG tablet Take 200 mg by mouth at bedtime.  10/01/14   Historical Provider, MD  thioridazine (MELLARIL) 50 MG tablet Take 1 tablet (50 mg total) by mouth every morning. 07/21/12   Norma Fredrickson, MD   BP 97/65 mmHg  Pulse 84  Temp(Src) 98.1 F (36.7 C) (Oral)  Resp 20  Ht _0  (1.854 m)  Wt 76.204 kg  BMI 22.17 kg/m2  SpO2 99% Physical Exam  Constitutional: He appears well-developed and well-nourished. No distress.  HENT:  Head: Normocephalic. Head is with abrasion.    Mouth/Throat: Oropharynx is clear and moist. No oropharyngeal exudate.  1.5x4cm abrasion noted above left eyebrow; no tenderness to palpation to  face or over abrasion  Eyes: Conjunctivae and EOM are normal. Pupils are equal, round, and reactive to light. Right eye exhibits no discharge. Left eye exhibits no discharge. No scleral icterus.  Neck: Normal range of motion. Neck supple. No thyromegaly present.  Cardiovascular: Normal rate, regular rhythm, normal heart sounds and intact distal pulses.  Exam reveals no gallop and no friction rub.   No murmur heard. Pulmonary/Chest: Effort normal and breath sounds normal. No stridor. No respiratory distress. He has no wheezes. He has no rales. He exhibits no tenderness.  Abdominal: Soft. Bowel sounds are normal. He exhibits no distension. There is no tenderness. There is no rebound and no guarding.  Musculoskeletal: He exhibits no edema.  No tenderness to palpation of cervical, thoracic, lumbar spine, bilateral hips, bilateral lower extremities, bilateral upper extremities  Lymphadenopathy:    He has no cervical adenopathy.  Neurological: He  is alert. Coordination normal.  CN 3-12 intact; normal sensation throughout; 5/5 strength in all 4 extremities; equal bilateral grip strength; no ataxia on finger to nose   Skin: Skin is warm and dry. No rash noted. He is not diaphoretic. No pallor.  Psychiatric: He has a normal mood and affect.  Nursing note and vitals reviewed.   ED Course  Procedures (including critical care time) Labs Review Labs Reviewed - No data to display  Imaging Review Ct Head Wo Contrast  02/10/2016  CLINICAL DATA:  Fall earlier today, laceration the above the right eye. EXAM: CT HEAD WITHOUT CONTRAST TECHNIQUE: Contiguous axial images were obtained from the base of the skull through the vertex without intravenous contrast. COMPARISON:  05/31/2014 FINDINGS: Chronic right occipital scalp density, not appreciably changed from 2015. Small remote linear infarct in the left inferior cerebellum, image 5/2. Periventricular white matter and corona radiata hypodensities favor chronic  ischemic microvascular white matter disease. Otherwise, the brainstem, cerebellum, cerebral peduncles, thalami, basal ganglia, basilar cisterns, and ventricular system appear within normal limits. No intracranial hemorrhage, mass lesion, or acute CVA. Chronic maxillary and ethmoid sinusitis. Chronic right sphenoid sinusitis. IMPRESSION: 1. No acute intracranial findings. 2. Periventricular white matter and corona radiata hypodensities favor chronic ischemic microvascular white matter disease. 3. 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 4. Chronic maxillary, ethmoid, and right sphenoid sinusitis. Electronically Signed   By: Van Clines M.D.   On: 02/10/2016 12:30   I have personally reviewed and evaluated these images and lab results as part of my medical decision-making.   EKG Interpretation None      MDM   Patient presents following fall yesterday. Minor abrasion to above left eyebrow. CT head shows no acute intracranial findings. Patient at baseline according to group home employees. Normal neuro exam, no focal deficits. Patient denies any pain anywhere. Patient evaluated by Dr. Alfonse Spruce who agrees patient is safe for discharge. Patient understands and agrees with plan. Patient vitals stable throughout ED course and discharged in satisfactory condition.  Final diagnoses:  Head injury, initial encounter  Fall, initial encounter        Frederica Kuster, PA-C 02/10/16 [place], MD 02/15/16 [phone]

## 2016-02-10 NOTE — ED Notes (Signed)
PA at bedside.

## 2016-02-10 NOTE — ED Notes (Signed)
MD at bedside. 

## 2016-02-10 NOTE — ED Notes (Signed)
Patient returned from radiology

## 2016-02-10 NOTE — ED Notes (Signed)
Patient transported to CT 

## 2016-02-11 ENCOUNTER — Telehealth: Payer: Self-pay | Admitting: Family

## 2016-02-11 NOTE — Telephone Encounter (Signed)
Scheduled ER f/u for 8:30am tomorrow and left detailed message on Donna's voicemail to call to confirm appt.

## 2016-02-11 NOTE — Telephone Encounter (Signed)
Attempted to reach Edward [name] to confirm that she received my message about ER f/u tomorrow at 8:30am and left detailed message for her to call me back and confirm appt. Also notified pt's sister. She states she is still in [place] and will not be able to come to appt at 8:30am but will also try to contact the group home re: appt.

## 2016-02-11 NOTE — Telephone Encounter (Signed)
Please contact the group home and let them know that I reviewed his notes from the ED.  I would like to see him back in the office this week please.

## 2016-02-12 ENCOUNTER — Ambulatory Visit: Payer: Medicaid Other | Admitting: Family

## 2016-02-20 ENCOUNTER — Ambulatory Visit (INDEPENDENT_AMBULATORY_CARE_PROVIDER_SITE_OTHER): Payer: Medicare Other | Admitting: Family

## 2016-02-20 ENCOUNTER — Encounter: Payer: Self-pay | Admitting: Family

## 2016-02-20 DIAGNOSIS — E119 Type 2 diabetes mellitus without complications: Secondary | ICD-10-CM

## 2016-02-20 DIAGNOSIS — R29898 Other symptoms and signs involving the musculoskeletal system: Secondary | ICD-10-CM | POA: Diagnosis not present

## 2016-02-20 DIAGNOSIS — D649 Anemia, unspecified: Secondary | ICD-10-CM

## 2016-02-20 DIAGNOSIS — R159 Full incontinence of feces: Secondary | ICD-10-CM | POA: Diagnosis not present

## 2016-02-20 DIAGNOSIS — R251 Tremor, unspecified: Secondary | ICD-10-CM

## 2016-02-20 LAB — CBC WITH DIFFERENTIAL/PLATELET
BASOS PCT: 0.5 % (ref 0.0–3.0)
Basophils Absolute: 0 10*3/uL (ref 0.0–0.1)
EOS ABS: 0.3 10*3/uL (ref 0.0–0.7)
EOS PCT: 3.2 % (ref 0.0–5.0)
HEMATOCRIT: 38.1 % — AB (ref 39.0–52.0)
HEMOGLOBIN: 12.6 g/dL — AB (ref 13.0–17.0)
LYMPHS PCT: 16.5 % (ref 12.0–46.0)
Lymphs Abs: 1.3 10*3/uL (ref 0.7–4.0)
MCHC: 33.1 g/dL (ref 30.0–36.0)
MCV: 94.4 fl (ref 78.0–100.0)
Monocytes Absolute: 0.4 10*3/uL (ref 0.1–1.0)
Monocytes Relative: 4.9 % (ref 3.0–12.0)
Neutro Abs: 6 10*3/uL (ref 1.4–7.7)
Neutrophils Relative %: 74.9 % (ref 43.0–77.0)
Platelets: 273 10*3/uL (ref 150.0–400.0)
RBC: 4.03 Mil/uL — AB (ref 4.22–5.81)
RDW: 18.5 % — AB (ref 11.5–15.5)
WBC: 8 10*3/uL (ref 4.0–10.5)

## 2016-02-20 LAB — BASIC METABOLIC PANEL
BUN: 20 mg/dL (ref 6–23)
CHLORIDE: 104 meq/L (ref 96–112)
CO2: 33 meq/L — AB (ref 19–32)
Calcium: 9.1 mg/dL (ref 8.4–10.5)
Creatinine, Ser: 1.11 mg/dL (ref 0.40–1.50)
GFR: 69.12 mL/min (ref >60.00)
GLUCOSE: 113 mg/dL — AB (ref 70–99)
POTASSIUM: 3.5 meq/L (ref 3.5–5.1)
Sodium: 143 mEq/L (ref 135–145)

## 2016-02-20 LAB — IRON: Iron: 33 ug/dL — ABNORMAL LOW (ref 42–165)

## 2016-02-20 NOTE — Progress Notes (Signed)
Subjective:    Patient ID: Edward [name], male    DOB: 09-04-1945, 70 y.o.   MRN: [id number]  HPI  Edward [name] is a 70 yr old male with hx of schizophrenia and mental retardation who presents today for ER follow up for fall.  He is accompanied by his sister Education officer, community) and his caregiver from his group home.  ER record is reviewed.  He apparently has had a total of 2 falls.  First fall was on 7/18 and then had a second fall on 02/12/16.  He got tripped on the blanket trying to stand from his chair.  He was evaluated in the ER  Due to head injury. He had a head CT which was negative for acute intracranial findings.  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   Not was also made of some chronic sinusitis. 2 days later he fell again.  He is losing strength in his legs for some time per [name] his sister. Seems worse since February. Also notes poor balance.  He continues to use a rolling walker.Resting tremor is getting worse per sister.  Fecal Incontinence-  Sister and caregiver report that this is becoming a more frequent problem and they want ot make sure that there is not a  "medical reason" why this is occurring.   Review of Systems See HPI  Past Medical History:  Diagnosis Date  . Anemia   . BPH (benign prostatic hyperplasia)   . Cataract   . Diabetes mellitus   . Hypertension    DENIES  . Mental retardation   . Schizophrenia ([place])   . Thyroid disease   . Urinary incontinence      Social History   Social History  . Marital status: Single    Spouse name: N/A  . Number of children: N/A  . Years of education: N/A   Occupational History  . Not on file.   Social History Main Topics  . Smoking status: Never Smoker  . Smokeless tobacco: Current User    Types: Chew     Comment: chews tobacco  . Alcohol use No  . Drug use: No  . Sexual activity: Not on file   Other Topics Concern  . Not on file   Social History Narrative   Lives in a group home   Sister [name] is his legal guardian.   Sister and her family live locally    Past Surgical History:  Procedure Laterality Date  . PROSTATE SURGERY     x2, ?procedure    Family History  Problem Relation Age of Onset  . Arthritis Mother   . Arrhythmia Mother   . Lymphoma Mother     non-hodgkins  . Kidney disease Mother   . Heart disease Father   . Stroke Father     No Known Allergies  Current Outpatient Prescriptions on File Prior to Visit  Medication Sig Dispense Refill  . aspirin 81 MG tablet Take 81 mg by mouth daily.    . Blood Glucose Monitoring Suppl (ACCU-CHEK AVIVA PLUS) W/DEVICE KIT DX E11.9 1 kit 0  . ciprofloxacin (CIPRO) 250 MG tablet Take 1 tablet (250 mg total) by mouth 2 (two) times daily. 10 tablet 0  . dutasteride (AVODART) 0.5 MG capsule Take 1 capsule (0.5 mg total) by mouth daily. 30 capsule 5  . Emollient (CERAVE) CREA Apply 1 application topically. For dry skin on feet and legs    .  ferrous sulfate 325 (65 FE) MG tablet Take 1 tablet (325 mg total) by mouth 2 (two) times daily with a meal. 60 tablet 5  . FLUoxetine (PROZAC) 10 MG capsule Take 1 capsule by mouth daily.    Marland Kitchen glucose blood (ACCU-CHEK AVIVA) test strip Use as instructed to check blood sugar once a day.  DX  E11.9 100 each 1  . ketoconazole (NIZORAL) 2 % cream Apply 1 application topically 2 (two) times daily. 3 days a week, Monday, Wednesday and friday 15 g   . Lancet Devices (ACCU-CHEK SOFTCLIX) lancets Use as instructed  DX E11.9 10 each 1  . levothyroxine (SYNTHROID, LEVOTHROID) 100 MCG tablet Take 1 tablet (100 mcg total) by mouth daily. 30 tablet 5  . lip balm (BLISTEX) OINT Apply 1 application topically 4 (four) times daily as needed for lip care.    . Loperamide HCl (IMODIUM A-D PO) Take by mouth as needed.    . Nutritional Supplements (NUTRITIONAL SHAKE PLUS) LIQD Drink 1 shake by mouth daily. 30 Bottle 5  . polyethylene glycol powder (GLYCOLAX/MIRALAX) powder 1  packet in 8 oz of water or juice once daily AS NEEDED for constipation 3350 g 1  . QUEtiapine (SEROQUEL) 50 MG tablet Take 50 mg by mouth 2 (two) times daily.    . Simethicone (MYLANTA GAS PO) Take by mouth as needed.    . tamsulosin (FLOMAX) 0.4 MG CAPS capsule Take 1 capsule (0.4 mg total) by mouth at bedtime. 30 capsule 2  . thioridazine (MELLARIL) 100 MG tablet Take 200 mg by mouth at bedtime.     Marland Kitchen thioridazine (MELLARIL) 50 MG tablet Take 1 tablet (50 mg total) by mouth every morning. 30 tablet 10   No current facility-administered medications on file prior to visit.     BP 130/82   Pulse 77   Temp 98.2 F (36.8 C) (Oral)   Resp 16   Ht 6' (1.829 m)   Wt 167 lb (75.8 kg)   SpO2 96% Comment: room air  BMI 22.65 kg/m       Objective:   Physical Exam  Constitutional: He is oriented to person, place, and time. He appears well-developed and well-nourished. No distress.  HENT:  Head: Normocephalic and atraumatic.  Eyes: EOM are normal. Pupils are equal, round, and reactive to light.  Cardiovascular: Normal rate and regular rhythm.   No murmur heard. Pulmonary/Chest: Effort normal and breath sounds normal. No respiratory distress. He has no wheezes. He has no rales.  Musculoskeletal: He exhibits no edema.  Neurological: He is alert and oriented to person, place, and time. He exhibits normal muscle tone.  + left hand tremor noted, + jaw tremor noted  Skin: Skin is warm and dry.  Psychiatric: He has a normal mood and affect. His behavior is normal. Thought content normal.          Assessment & Plan:  Falls- offered PT referral. Sister declines PT as patient had trouble participating in the past and they did not find it to be very helpful.  Caretaker from his group home has requested an order for a Gait Belt which has been provided.  I am concerned with his hand and jaw tremor about the possibility of parkinson's.  The other consideration is [place] from his log standing use  of mellaril.  Per Sister, his psychiatrist is aware of his symptoms.    Anemia-  Follow up iron and blood count looks better. Continue iron supplement.  Lab Results  Component Value Date   WBC 8.0 02/20/2016   HGB 12.6 (L) 02/20/2016   HCT 38.1 (L) 02/20/2016   MCV 94.4 02/20/2016   PLT 273.0 02/20/2016   Fecal incontinence- particularly concerning with the report of falls/LE weakness that there could be spinal involvement.  Sister does not think that he could tolerate MRI.  Will begin with a CT of the Lumbar spine.    Debbrah Alar NP

## 2016-02-20 NOTE — Patient Instructions (Signed)
Please complete lab work prior to leaving. You will be contacted about your referral to neurology and about scheduling Bill's CT scan and dermatology appointment.

## 2016-02-20 NOTE — Progress Notes (Signed)
Pre visit review using our clinic review tool, if applicable. No additional management support is needed unless otherwise documented below in the visit note. 

## 2016-02-22 ENCOUNTER — Encounter: Payer: Self-pay | Admitting: Family

## 2016-02-23 ENCOUNTER — Telehealth: Payer: Self-pay | Admitting: *Deleted

## 2016-02-23 DIAGNOSIS — R29898 Other symptoms and signs involving the musculoskeletal system: Secondary | ICD-10-CM

## 2016-02-23 NOTE — Telephone Encounter (Signed)
-----   Message from Margot Ables sent at 02/20/2016  3:39 PM EDT ----- Regarding: RE: ct lumbar Adding Crispin Vogel to change order ----- Message ----- From: Katha Hamming Sent: 02/20/2016   2:25 PM To: Artelia Laroche Subject: FW: ct lumbar                                    ----- Message ----- From: Katha Hamming Sent: 02/20/2016   2:21 PM To: Debbrah Alar, NP Subject: ct lumbar                                      Hey Melissa:  Sarah, CT technologist, states this should be ordered as a CT lumbar w/o.     Thanks, Hoyle Sauer

## 2016-02-23 NOTE — Telephone Encounter (Signed)
Done

## 2016-02-23 NOTE — Telephone Encounter (Signed)
Melissa-- please advise? 

## 2016-02-24 ENCOUNTER — Ambulatory Visit (HOSPITAL_BASED_OUTPATIENT_CLINIC_OR_DEPARTMENT_OTHER)
Admission: RE | Admit: 2016-02-24 | Discharge: 2016-02-24 | Disposition: A | Discharge status: Home / Self Care | Payer: Medicare Other | Source: Ambulatory Visit | Attending: Family | Admitting: Family

## 2016-02-24 DIAGNOSIS — R159 Full incontinence of feces: Secondary | ICD-10-CM | POA: Diagnosis not present

## 2016-02-24 DIAGNOSIS — N1330 Unspecified hydronephrosis: Secondary | ICD-10-CM | POA: Diagnosis not present

## 2016-02-24 DIAGNOSIS — M47896 Other spondylosis, lumbar region: Secondary | ICD-10-CM | POA: Insufficient documentation

## 2016-02-24 DIAGNOSIS — R29898 Other symptoms and signs involving the musculoskeletal system: Secondary | ICD-10-CM | POA: Insufficient documentation

## 2016-02-24 DIAGNOSIS — M4806 Spinal stenosis, lumbar region: Secondary | ICD-10-CM | POA: Insufficient documentation

## 2016-02-24 DIAGNOSIS — R296 Repeated falls: Secondary | ICD-10-CM | POA: Diagnosis not present

## 2016-02-26 DIAGNOSIS — N139 Obstructive and reflux uropathy, unspecified: Secondary | ICD-10-CM | POA: Diagnosis not present

## 2016-02-27 ENCOUNTER — Telehealth: Payer: Self-pay | Admitting: Family

## 2016-02-27 NOTE — Telephone Encounter (Signed)
-----   Message from Nickie Retort, MD sent at 02/26/2016  9:44 AM EDT ----- Thanks for pointing this out. He ideally needs full time drainage of his bladder with a foley catheter or intermittent catheterization as poor bladder drainage is causing his hydronephrosis. Unfortunately his group home will not perform intermittent cath, and he cannot tolerate a foley catheter. His other option would be an SP tube, but he was not interested in this at his last visit either.    ----- Message ----- From: Debbrah Alar, NP Sent: 02/25/2016   9:34 AM To: Nickie Retort, MD, Debbrah Alar, NP  Dr. Pilar Jarvis,  I recently completed a CT scan on our mutual patient, Edward [name]. Not is made of slightly more prominent bilateral hydroureteronephrosis.  I wanted to bring this to your attention.  Please let me know if you get this message.    Thanks,  Debbrah Alar NP

## 2016-03-12 ENCOUNTER — Telehealth: Payer: Self-pay | Admitting: *Deleted

## 2016-03-12 NOTE — Telephone Encounter (Signed)
Received fax from Delray Alt with [facility] requesting completion of Annual Physical Form. Form completed / signed and faxed to [phone], attn: Butch Penny.

## 2016-03-16 NOTE — Progress Notes (Signed)
Edward [name] was seen today in the movement disorders clinic for neurologic consultation at the request of Nance Pear., NP.  The consultation is for the evaluation of tremor and to r/o PD.  The records that were made available to me were reviewed.  He is accompanied by his sister Education officer, community) and caregiver from group home, who supplement the history.  He has a long hx of MR and schizophrenia.  Sister states that sx's got worse over this past year, but then cannot state how long it has been going on.   Specific Symptoms:  Tremor: Yes.  , L hand and face intermittently Family hx of similar:  Yes.   (sister states that "everyone" gets shake with the L hand) Voice: doesn't enunciate as well and told by psych that may have TD Sleep: sleeps well  Vivid Dreams:  No.  Acting out dreams:  No. Wet Pillows: No. Postural symptoms:  Yes.    Falls?  Yes.   (wears gait belt x 2 weeks; walker for 1.5 years and uses it when out; last PT 1.5 years years ago and sister thinks that it wouldn't help because of poor carry through with staff) Bradykinesia symptoms: shuffling gait, slow movements, difficulty getting out of a chair and difficulty regaining balance drags R leg when walking Loss of smell:  Sister thinks that it is okay Loss of taste:  No. Urinary Incontinence:  Yes.  , wears diaper Difficulty Swallowing:  No., states that had MBE in 11/2014 and it was normal Trouble with ADL's:  Yes.   (assist x 1 year)  Trouble buttoning clothing: Yes.   Depression:  Good as long as treated with respect per caregiver Memory changes: stable per sister Hallucinations:  Under control with meds (had this with schizophrenia per sister)  visual distortions: No. N/V:  Occasional nausea if eats too much Lightheaded:  No.  Syncope: No.   As an aside, noted that the L hand has been in a fist and difficult to open.  Sister says that pt c/o it yesterday.  The caregiver states that he has noticed it for a few  weeks, but also states that he was not with the patient more than for a few weeks (he used to be with him about a year ago and then came back recently).  Just recently, they have noted more difficulty getting out of the chair.  This perhaps that's been going on over the last several months.  He has had to wear a gait belt.  They have also noticed some change in his mental status.  He used to be able to follow commands much better.   Neuroimaging has  previously been performed.  It is available for my review today.  There is a CT done on 02/10/16.    It was done on 02/10/16 and there was mod-advanced WMD and some atrophy.  PREVIOUS MEDICATIONS: none to date  ALLERGIES:  No Known Allergies  CURRENT MEDICATIONS:  Outpatient Encounter Prescriptions as of 03/17/2016  Medication Sig  . aspirin 81 MG tablet Take 81 mg by mouth daily.  Marland Kitchen dutasteride (AVODART) 0.5 MG capsule Take 1 capsule (0.5 mg total) by mouth daily.  . Emollient (CERAVE) CREA Apply 1 application topically. For dry skin on feet and legs  . ferrous sulfate 325 (65 FE) MG tablet Take 1 tablet (325 mg total) by mouth 2 (two) times daily with a meal.  . FLUoxetine (PROZAC) 10 MG capsule Take 1 capsule by mouth  daily.  . ketoconazole (NIZORAL) 2 % cream Apply 1 application topically 2 (two) times daily. 3 days a week, Monday, Wednesday and friday  . levothyroxine (SYNTHROID, LEVOTHROID) 100 MCG tablet Take 1 tablet (100 mcg total) by mouth daily.  Marland Kitchen lip balm (BLISTEX) OINT Apply 1 application topically 4 (four) times daily as needed for lip care.  . Loperamide HCl (IMODIUM A-D PO) Take by mouth as needed.  . Nutritional Supplements (NUTRITIONAL SHAKE PLUS) LIQD Drink 1 shake by mouth daily.  . polyethylene glycol powder (GLYCOLAX/MIRALAX) powder 1 packet in 8 oz of water or juice once daily AS NEEDED for constipation  . QUEtiapine (SEROQUEL) 50 MG tablet Take 50 mg by mouth 2 (two) times daily.  . Simethicone (MYLANTA GAS PO) Take by mouth  as needed.  . tamsulosin (FLOMAX) 0.4 MG CAPS capsule Take 1 capsule (0.4 mg total) by mouth at bedtime.  Marland Kitchen thioridazine (MELLARIL) 100 MG tablet Take 200 mg by mouth at bedtime.   Marland Kitchen thioridazine (MELLARIL) 50 MG tablet Take 1 tablet (50 mg total) by mouth every morning.  . Blood Glucose Monitoring Suppl (ACCU-CHEK AVIVA PLUS) W/DEVICE KIT DX E11.9 (Patient not taking: Reported on 03/17/2016)  . diazepam (VALIUM) 5 MG tablet Take one tablet 30 minutes prior to MR (on arrival to facility) and one more just prior to MR (if needed)  . glucose blood (ACCU-CHEK AVIVA) test strip Use as instructed to check blood sugar once a day.  DX  E11.9 (Patient not taking: Reported on 03/17/2016)  . Lancet Devices (ACCU-CHEK SOFTCLIX) lancets Use as instructed  DX E11.9 (Patient not taking: Reported on 03/17/2016)  . [DISCONTINUED] ciprofloxacin (CIPRO) 250 MG tablet Take 1 tablet (250 mg total) by mouth 2 (two) times daily.   No facility-administered encounter medications on file as of 03/17/2016.     PAST MEDICAL HISTORY:   Past Medical History:  Diagnosis Date  . Anemia   . BPH (benign prostatic hyperplasia)   . Cataract   . Diabetes mellitus   . Hypertension    DENIES  . Mental retardation   . Schizophrenia ([place])   . Thyroid disease   . Urinary incontinence     PAST SURGICAL HISTORY:   Past Surgical History:  Procedure Laterality Date  . PROSTATE SURGERY     x2, ?procedure    SOCIAL HISTORY:   Social History   Social History  . Marital status: Single    Spouse name: N/A  . Number of children: N/A  . Years of education: N/A   Occupational History  . Not on file.   Social History Main Topics  . Smoking status: Never Smoker  . Smokeless tobacco: Current User    Types: Chew     Comment: chews tobacco  . Alcohol use No  . Drug use: No  . Sexual activity: Not on file   Other Topics Concern  . Not on file   Social History Narrative   Lives in a group home   Sister [name] is his  legal guardian.   Sister and her family live locally    FAMILY HISTORY:   Family Status  Relation Status  . Mother Deceased  . Father Deceased  . Maternal Grandmother Deceased  . Maternal Grandfather Deceased  . Paternal Grandmother Deceased  . Paternal Grandfather Deceased    ROS:  ROS is attempted but limited due to poor comprehension level.    PHYSICAL EXAMINATION:    VITALS:   Vitals:   03/17/16 6967  BP: 130/62  Pulse: (!) 103  Weight: 165 lb (74.8 kg)  Height: _0  (1.778 m)    GEN:  The patient appears stated age and is in NAD. HEENT:  Normocephalic, atraumatic.  The mucous membranes are moist. The superficial temporal arteries are without ropiness or tenderness. CV:  Tachycardic.  Regular rhythm. Lungs:  CTAB Neck/HEME:  There are no carotid bruits bilaterally.  Neurological examination:  Orientation: The patient does not participate with questions of orientation.  Cranial nerves: There is good facial symmetry.  Unable to assess pupils are Sunday, as the patient squeezes his eyes shut.  Does not participate with extraocular muscle testing, but does track about the room appropriately.  He does not participate with visual field testing.  His speech is dysarthric.  Soft palate does raise symmetrically.  Hearing appears to be intact to conversational tone. Sensation: Sensation appears to be intact to light touch throughout.  He states that he feels vibration in all distal extremities, but his sister is not sure that he understands this.  He does not participate with more sensitive aspects of the sensory testing. Motor: Strength is 5/5 in the bilateral biceps and triceps.  Strength is at least 4/5 in the bilateral lower extremities, but despite multiple attempts, he has difficulty participating with manual muscle testing because of cognition in the lower extremities.  He has significant wasting of the muscles of the forearm bilaterally, and especially in the hands.  There  is wasting of the bilateral APB, ADQM.  The distal left phalanges are held in a flexed fashion, but passive motion is even easily able to extend them.  There are fasciculations across the forearm bilaterally.  There are no tongue fasciculations. Deep tendon reflexes: Deep tendon reflexes are 2+/4 at the bilateral biceps, triceps, brachioradialis, but only 1/4 at the bilateral patella and achilles. Plantar responses are downgoing bilaterally.  Movement examination: Tone: There is normal tone in the bilateral upper extremities.  The tone in the lower extremities is normal.  He is not spastic or rigid. Abnormal movements: There is a rare some tremor on the left. Coordination:  He is able to participate with hand opening and closing, although he has some difficulty with this on the left.  I was not able to notice any significant decremation, but again he has difficulty with participating with other forms of rapid alternating movements. Gait and Station: The patient has difficulty arising out of a deep-seated chair without the use of the hands.  He is able to arise out of the chair when pushing off and is unsteady walking down the hall, but does not shuffle.    ASSESSMENT/PLAN:  1.  Forearm atrophy and weakness, with fasciculations  -Clinically, I think we need to rule out a cervical spine issue, particularly in the lower cervical spine (C7/C8).  It is very difficult to get an adequate examination on him, and history is also very limited.  We will do an MRI of the cervical spine.  We will also get an MRI of the brain just to make sure we are not missing anything.  -It is possible that the patient has something such as motor neuron disease, but the patient's family states that he would be uncooperative to proceed with EMG testing.  -If the patient is unable to complete the above, or if we don't get answers, then we may need to just follow him clinically, if the patient is even willing to do that.  If they're  willing to  do that, then I may transfer him to the care of my partner, Dr. Posey Pronto, since this is much more within the realm of her expertise.  2.  Mild tremor in the left hand  -I had a long counseling session with the patient today.  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  He really does not have a lot of other features of secondary parkinsonism.  He is not rigid at all.  He is also on quetiapine, although it is much less likely that this would because a tremor.  Regardless, I would not particularly worry about the tremor.  He does not meet criteria for idiopathic Parkinson's disease.    3.  Follow-up after the above is completed.  Greater than 50% of this 60 minute visit was spent in counseling and coordinating care.

## 2016-03-17 ENCOUNTER — Ambulatory Visit (INDEPENDENT_AMBULATORY_CARE_PROVIDER_SITE_OTHER): Payer: Medicare Other | Admitting: Neurology

## 2016-03-17 ENCOUNTER — Other Ambulatory Visit: Payer: Self-pay | Admitting: Neurology

## 2016-03-17 ENCOUNTER — Encounter: Payer: Self-pay | Admitting: Neurology

## 2016-03-17 VITALS — BP 130/62 | HR 103 | Ht 70.0 in | Wt 165.0 lb

## 2016-03-17 DIAGNOSIS — M6250 Muscle wasting and atrophy, not elsewhere classified, unspecified site: Secondary | ICD-10-CM | POA: Diagnosis not present

## 2016-03-17 DIAGNOSIS — R292 Abnormal reflex: Secondary | ICD-10-CM

## 2016-03-17 DIAGNOSIS — R4182 Altered mental status, unspecified: Secondary | ICD-10-CM

## 2016-03-17 DIAGNOSIS — R253 Fasciculation: Secondary | ICD-10-CM

## 2016-03-17 DIAGNOSIS — R296 Repeated falls: Secondary | ICD-10-CM

## 2016-03-17 MED ORDER — DIAZEPAM 5 MG PO TABS: ORAL_TABLET | ORAL | 0 refills | Status: DC

## 2016-03-17 NOTE — Patient Instructions (Addendum)
1. We have sent a referral to [facility] for your MRI and they will call you directly to schedule your appt. They are located at [place]. If you need to contact them directly please call [phone]. They have been instructed to call patient's sister Golda Acre at [phone].  2. We have given you a prescription for Valium for the MRI. Take one tablet 30 minutes prior to MR (on arrival to facility) and one more just prior to MR (if needed)

## 2016-03-22 ENCOUNTER — Ambulatory Visit: Payer: Self-pay | Admitting: Neurology

## 2016-03-22 DIAGNOSIS — L821 Other seborrheic keratosis: Secondary | ICD-10-CM | POA: Diagnosis not present

## 2016-03-30 ENCOUNTER — Telehealth: Payer: Self-pay | Admitting: Neurology

## 2016-03-30 ENCOUNTER — Ambulatory Visit
Admission: RE | Admit: 2016-03-30 | Discharge: 2016-03-30 | Disposition: A | Discharge status: Home / Self Care | Payer: Medicare Other | Source: Ambulatory Visit | Attending: Neurology | Admitting: Neurology

## 2016-03-30 DIAGNOSIS — R292 Abnormal reflex: Secondary | ICD-10-CM

## 2016-03-30 DIAGNOSIS — R253 Fasciculation: Secondary | ICD-10-CM

## 2016-03-30 DIAGNOSIS — R296 Repeated falls: Secondary | ICD-10-CM

## 2016-03-30 DIAGNOSIS — M50222 Other cervical disc displacement at C5-C6 level: Secondary | ICD-10-CM | POA: Diagnosis not present

## 2016-03-30 DIAGNOSIS — M6250 Muscle wasting and atrophy, not elsewhere classified, unspecified site: Secondary | ICD-10-CM

## 2016-03-30 DIAGNOSIS — M50221 Other cervical disc displacement at C4-C5 level: Secondary | ICD-10-CM | POA: Diagnosis not present

## 2016-03-30 DIAGNOSIS — R4781 Slurred speech: Secondary | ICD-10-CM | POA: Diagnosis not present

## 2016-03-30 DIAGNOSIS — R4182 Altered mental status, unspecified: Secondary | ICD-10-CM

## 2016-03-30 NOTE — Telephone Encounter (Signed)
Left message on machine for patient to call back.

## 2016-03-30 NOTE — Telephone Encounter (Signed)
-----   Message from Cameron Sprang, MD sent at 03/30/2016  4:01 PM EDT ----- Pls let patient know MRI brain does not show any new changes, no tumor, stroke, or bleed. It shows age-related changes. MRI cervical spine shows arthritis changes.

## 2016-03-31 NOTE — Telephone Encounter (Signed)
Spoke with patient's sister. Made aware of results. Appt made with Dr. Posey Pronto.

## 2016-03-31 NOTE — Telephone Encounter (Signed)
Dr. Carles Collet sent this message: Reviewed and agree. Let pts sister (she is HCPOA I believe) know that while there are degenerative changes and pinched nerves in neck, not sure that this explains all sx's. I know that they told me he wouldn't tolerate EMG. If nothing else, he needs to be followed in our clinic and probably in Dr. Ena Dawley (she saw pt with me on DOS). Let me know if they are agreeable

## 2016-03-31 NOTE — Telephone Encounter (Signed)
[name] sister Golda Acre left a message she was returning your call/Dawn CB# [phone]

## 2016-04-08 DIAGNOSIS — N3020 Other chronic cystitis without hematuria: Secondary | ICD-10-CM | POA: Diagnosis not present

## 2016-04-14 ENCOUNTER — Encounter: Payer: Self-pay | Admitting: Family

## 2016-04-14 ENCOUNTER — Ambulatory Visit (INDEPENDENT_AMBULATORY_CARE_PROVIDER_SITE_OTHER): Payer: Medicare Other | Admitting: Family

## 2016-04-14 DIAGNOSIS — E039 Hypothyroidism, unspecified: Secondary | ICD-10-CM

## 2016-04-14 DIAGNOSIS — Z23 Encounter for immunization: Secondary | ICD-10-CM | POA: Diagnosis not present

## 2016-04-14 DIAGNOSIS — E785 Hyperlipidemia, unspecified: Secondary | ICD-10-CM | POA: Diagnosis not present

## 2016-04-14 DIAGNOSIS — N1330 Unspecified hydronephrosis: Secondary | ICD-10-CM

## 2016-04-14 DIAGNOSIS — D509 Iron deficiency anemia, unspecified: Secondary | ICD-10-CM | POA: Diagnosis not present

## 2016-04-14 DIAGNOSIS — E119 Type 2 diabetes mellitus without complications: Secondary | ICD-10-CM

## 2016-04-14 DIAGNOSIS — N131 Hydronephrosis with ureteral stricture, not elsewhere classified: Secondary | ICD-10-CM

## 2016-04-14 NOTE — Progress Notes (Signed)
Subjective:    Patient ID: Edward [name], male    DOB: 16-Sep-1945, 70 y.o.   MRN: [id number]  HPI  Edward [name] is a 70 yr old male who presents today for follow up.  1) DM2- This has been diet controlled.  Lab Results  Component Value Date   HGBA1C 5.8 12/15/2015   HGBA1C 5.4 05/20/2015   HGBA1C 5.1 01/15/2015   Lab Results  Component Value Date   LDLCALC 62 08/20/2015   CREATININE 1.11 02/20/2016   2) Hypothyroid- continues synthroid.  Lab Results  Component Value Date   TSH 2.83 12/15/2015   3) Hyperlipidemia- not currently on statin.  Lab Results  Component Value Date   CHOL 116 08/20/2015   HDL 34.30 (L) 08/20/2015   LDLCALC 62 08/20/2015   TRIG 100.0 08/20/2015   CHOLHDL 3 08/20/2015   4) Fall-  Occurred after sedation for MRI. No sustained injury.   Of note, Ct of the lumbar spine noted mild to moderate bilateral hydroureteronephrosis. This was reviewed with Urology and it was felt that pt would not tolerate I and O caths or chronic indwelling catheter which is what would be needed to alleviate the hydroureter.  I reviewed this again with [name], pts sister and she re-iterates this.   Review of Systems   Sister/caregiver report that he is moving his bowels regularly but remains incontinent of urine and stool.     see HPI  Past Medical History:  Diagnosis Date  . Anemia   . BPH (benign prostatic hyperplasia)   . Cataract   . Diabetes mellitus   . Hypertension    DENIES  . Mental retardation   . Schizophrenia ([place])   . Thyroid disease   . Urinary incontinence      Social History   Social History  . Marital status: Single    Spouse name: N/A  . Number of children: N/A  . Years of education: N/A   Occupational History  . Not on file.   Social History Main Topics  . Smoking status: Never Smoker  . Smokeless tobacco: Current User    Types: Chew     Comment: chews tobacco  . Alcohol use No  . Drug use: No  . Sexual activity: Not on file    Other Topics Concern  . Not on file   Social History Narrative   Lives in a group home   Sister [name] is his legal guardian.   Sister and her family live locally    Past Surgical History:  Procedure Laterality Date  . PROSTATE SURGERY     x2, ?procedure    Family History  Problem Relation Age of Onset  . Arthritis Mother   . Arrhythmia Mother   . Lymphoma Mother     non-hodgkins  . Kidney disease Mother   . Heart disease Father   . Stroke Father     No Known Allergies  Current Outpatient Prescriptions on File Prior to Visit  Medication Sig Dispense Refill  . aspirin 81 MG tablet Take 81 mg by mouth daily.    . Blood Glucose Monitoring Suppl (ACCU-CHEK AVIVA PLUS) W/DEVICE KIT DX E11.9 1 kit 0  . diazepam (VALIUM) 5 MG tablet Take one tablet 30 minutes prior to MR (on arrival to facility) and one more just prior to MR (if needed) 2 tablet 0  . dutasteride (AVODART) 0.5 MG capsule Take 1 capsule (0.5 mg total) by mouth daily. 30 capsule 5  . Emollient (  CERAVE) CREA Apply 1 application topically. For dry skin on feet and legs    . ferrous sulfate 325 (65 FE) MG tablet Take 1 tablet (325 mg total) by mouth 2 (two) times daily with a meal. 60 tablet 5  . FLUoxetine (PROZAC) 10 MG capsule Take 1 capsule by mouth daily.    Marland Kitchen glucose blood (ACCU-CHEK AVIVA) test strip Use as instructed to check blood sugar once a day.  DX  E11.9 100 each 1  . ketoconazole (NIZORAL) 2 % cream Apply 1 application topically 2 (two) times daily. 3 days a week, Monday, Wednesday and friday 15 g   . Lancet Devices (ACCU-CHEK SOFTCLIX) lancets Use as instructed  DX E11.9 10 each 1  . levothyroxine (SYNTHROID, LEVOTHROID) 100 MCG tablet Take 1 tablet (100 mcg total) by mouth daily. 30 tablet 5  . lip balm (BLISTEX) OINT Apply 1 application topically 4 (four) times daily as needed for lip care.    . Loperamide HCl (IMODIUM A-D PO) Take by mouth as needed.    . Nutritional Supplements (NUTRITIONAL SHAKE  PLUS) LIQD Drink 1 shake by mouth daily. 30 Bottle 5  . polyethylene glycol powder (GLYCOLAX/MIRALAX) powder 1 packet in 8 oz of water or juice once daily AS NEEDED for constipation 3350 g 1  . QUEtiapine (SEROQUEL) 50 MG tablet Take 50 mg by mouth 2 (two) times daily.    . Simethicone (MYLANTA GAS PO) Take by mouth as needed.    . tamsulosin (FLOMAX) 0.4 MG CAPS capsule Take 1 capsule (0.4 mg total) by mouth at bedtime. 30 capsule 2  . thioridazine (MELLARIL) 100 MG tablet Take 200 mg by mouth at bedtime.     Marland Kitchen thioridazine (MELLARIL) 50 MG tablet Take 1 tablet (50 mg total) by mouth every morning. 30 tablet 10   No current facility-administered medications on file prior to visit.     BP 126/77 (BP Location: Left Arm, Cuff Size: Normal)   Pulse 89   Temp 98.2 F (36.8 C) (Oral)   Resp 18   Ht 5' 10"  (1.778 m)   Wt 166 lb 9.6 oz (75.6 kg)   SpO2 100% Comment: room air  BMI 23.90 kg/m    Objective:   Physical Exam  Constitutional: He is oriented to person, place, and time. He appears well-developed and well-nourished. No distress.  HENT:  Head: Normocephalic and atraumatic.  Cardiovascular: Normal rate and regular rhythm.   No murmur heard. Pulmonary/Chest: Effort normal and breath sounds normal. No respiratory distress. He has no wheezes. He has no rales.  Musculoskeletal: He exhibits no edema.  Left hand is contracted.   Neurological: He is alert and oriented to person, place, and time.  Skin: Skin is warm and dry.  Psychiatric: He has a normal mood and affect. His behavior is normal. Thought content normal.          Assessment & Plan:  Left hand contracture-  Pt was evaluated by neurology for this along with his tremor.  The family does not wish to pursue invasive testing such as EMG.  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 C spine showed some cervical spondylosis.  Dr. Carles Collet felt that his mellaril could be contributing to his tremor. She has referred him to  Dr. Posey Pronto for further evaluation.

## 2016-04-14 NOTE — Progress Notes (Signed)
Pre visit review using our clinic review tool, if applicable. No additional management support is needed unless otherwise documented below in the visit note. 

## 2016-04-14 NOTE — Assessment & Plan Note (Signed)
Not a good candidate for indwelling catheter or I and O catheret per urology and family wishes.

## 2016-04-14 NOTE — Assessment & Plan Note (Signed)
Clinically stable on synthroid. Continue same.  

## 2016-04-14 NOTE — Assessment & Plan Note (Signed)
Slowly improving, continue iron supplement.

## 2016-04-14 NOTE — Assessment & Plan Note (Signed)
ldl at goal, monitor.

## 2016-04-14 NOTE — Assessment & Plan Note (Signed)
Diet controlled. °Monitor. °

## 2016-05-04 ENCOUNTER — Encounter: Payer: Self-pay | Admitting: Family

## 2016-05-05 DIAGNOSIS — I10 Essential (primary) hypertension: Secondary | ICD-10-CM | POA: Diagnosis not present

## 2016-05-05 DIAGNOSIS — S098XXA Other specified injuries of head, initial encounter: Secondary | ICD-10-CM | POA: Diagnosis not present

## 2016-05-05 DIAGNOSIS — F70 Mild intellectual disabilities: Secondary | ICD-10-CM | POA: Diagnosis not present

## 2016-05-05 DIAGNOSIS — E119 Type 2 diabetes mellitus without complications: Secondary | ICD-10-CM | POA: Diagnosis not present

## 2016-05-05 DIAGNOSIS — S199XXA Unspecified injury of neck, initial encounter: Secondary | ICD-10-CM | POA: Diagnosis not present

## 2016-05-05 DIAGNOSIS — S51012A Laceration without foreign body of left elbow, initial encounter: Secondary | ICD-10-CM | POA: Diagnosis not present

## 2016-05-05 DIAGNOSIS — R1110 Vomiting, unspecified: Secondary | ICD-10-CM | POA: Diagnosis not present

## 2016-05-05 DIAGNOSIS — S064X0A Epidural hemorrhage without loss of consciousness, initial encounter: Secondary | ICD-10-CM | POA: Diagnosis not present

## 2016-05-05 DIAGNOSIS — F79 Unspecified intellectual disabilities: Secondary | ICD-10-CM | POA: Diagnosis not present

## 2016-05-05 DIAGNOSIS — S0003XA Contusion of scalp, initial encounter: Secondary | ICD-10-CM | POA: Diagnosis not present

## 2016-05-05 DIAGNOSIS — F209 Schizophrenia, unspecified: Secondary | ICD-10-CM | POA: Diagnosis not present

## 2016-05-05 DIAGNOSIS — S0990XA Unspecified injury of head, initial encounter: Secondary | ICD-10-CM | POA: Diagnosis not present

## 2016-05-06 DIAGNOSIS — R259 Unspecified abnormal involuntary movements: Secondary | ICD-10-CM | POA: Diagnosis not present

## 2016-05-06 DIAGNOSIS — S0990XA Unspecified injury of head, initial encounter: Secondary | ICD-10-CM | POA: Diagnosis not present

## 2016-05-20 DIAGNOSIS — N3020 Other chronic cystitis without hematuria: Secondary | ICD-10-CM | POA: Diagnosis not present

## 2016-05-31 ENCOUNTER — Ambulatory Visit (INDEPENDENT_AMBULATORY_CARE_PROVIDER_SITE_OTHER): Payer: Medicare Other | Admitting: Family

## 2016-05-31 ENCOUNTER — Encounter: Payer: Self-pay | Admitting: Family

## 2016-05-31 VITALS — BP 137/59 | HR 90 | Temp 98.5°F | Resp 18 | Ht 70.0 in | Wt 166.8 lb

## 2016-05-31 DIAGNOSIS — Z0000 Encounter for general adult medical examination without abnormal findings: Secondary | ICD-10-CM

## 2016-05-31 DIAGNOSIS — E119 Type 2 diabetes mellitus without complications: Secondary | ICD-10-CM

## 2016-05-31 DIAGNOSIS — D509 Iron deficiency anemia, unspecified: Secondary | ICD-10-CM

## 2016-05-31 DIAGNOSIS — E039 Hypothyroidism, unspecified: Secondary | ICD-10-CM

## 2016-05-31 DIAGNOSIS — Z111 Encounter for screening for respiratory tuberculosis: Secondary | ICD-10-CM

## 2016-05-31 NOTE — Progress Notes (Signed)
Pre visit review using our clinic review tool, if applicable. No additional management support is needed unless otherwise documented below in the visit note. 

## 2016-05-31 NOTE — Progress Notes (Signed)
Subjective:    Edward [name] is a 70 y.o. male who presents for Medicare Annual/Subsequent preventive examination.   Preventive Screening-Counseling & Management  Tobacco History  Smoking Status  . Never Smoker  Smokeless Tobacco  . Current User  . Types: Chew    Comment: chews tobacco    Problems Prior to Visit  Patient presents today for complete physical.  Immunizations: up to date Diet: eating well Exercise: as able, having more trouble getting up/down.   Colonoscopy: declines, facility unable to do cologuard.   Sister notes decline in his ambulation. Still has a left hand contracture.  Seems to have occurred in March/april. He is scheduled to see neurology on Friday. They are continuing to evaluate him for movement disorder.    Current Problems (verified) Patient Active Problem List   Diagnosis Date Noted  . Hydroureteronephrosis 04/14/2016  . Loss of weight 01/02/2016  . Prolonged Q-T interval on ECG 07/08/2015  . Dysphagia 11/04/2014  . Heme + stool 10/05/2014  . Constipation 09/11/2014  . Stage 2 skin ulcer of sacral region 05/06/2014  . Anemia, iron deficiency 05/05/2014  . Routine general medical examination at a health care facility 03/23/2013  . Hypothyroidism 01/05/2013  . Eczema 04/10/2012  . Schizophrenia ([place]) 04/10/2012  . BPH (benign prostatic hyperplasia) 04/10/2012  . HTN (hypertension) 04/10/2012  . Diabetes mellitus without complication ([place]) 79/08/4095  . Hyperlipidemia 04/10/2012    Medications Prior to Visit Current Outpatient Prescriptions on File Prior to Visit  Medication Sig Dispense Refill  . aspirin 81 MG tablet Take 81 mg by mouth daily.    . Blood Glucose Monitoring Suppl (ACCU-CHEK AVIVA PLUS) W/DEVICE KIT DX E11.9 1 kit 0  . diazepam (VALIUM) 5 MG tablet Take one tablet 30 minutes prior to MR (on arrival to facility) and one more just prior to MR (if needed) 2 tablet 0  . dutasteride (AVODART) 0.5 MG capsule Take 1 capsule  (0.5 mg total) by mouth daily. 30 capsule 5  . Emollient (CERAVE) CREA Apply 1 application topically. For dry skin on feet and legs    . ferrous sulfate 325 (65 FE) MG tablet Take 1 tablet (325 mg total) by mouth 2 (two) times daily with a meal. 60 tablet 5  . FLUoxetine (PROZAC) 10 MG capsule Take 1 capsule by mouth daily.    Marland Kitchen glucose blood (ACCU-CHEK AVIVA) test strip Use as instructed to check blood sugar once a day.  DX  E11.9 100 each 1  . ketoconazole (NIZORAL) 2 % cream Apply 1 application topically 2 (two) times daily. 3 days a week, Monday, Wednesday and friday 15 g   . Lancet Devices (ACCU-CHEK SOFTCLIX) lancets Use as instructed  DX E11.9 10 each 1  . levothyroxine (SYNTHROID, LEVOTHROID) 100 MCG tablet Take 1 tablet (100 mcg total) by mouth daily. 30 tablet 5  . lip balm (BLISTEX) OINT Apply 1 application topically 4 (four) times daily as needed for lip care.    . Loperamide HCl (IMODIUM A-D PO) Take by mouth as needed.    . Nutritional Supplements (NUTRITIONAL SHAKE PLUS) LIQD Drink 1 shake by mouth daily. 30 Bottle 5  . polyethylene glycol powder (GLYCOLAX/MIRALAX) powder 1 packet in 8 oz of water or juice once daily AS NEEDED for constipation 3350 g 1  . QUEtiapine (SEROQUEL) 50 MG tablet Take 50 mg by mouth 2 (two) times daily.    . Simethicone (MYLANTA GAS PO) Take by mouth as needed.    . tamsulosin (  FLOMAX) 0.4 MG CAPS capsule Take 1 capsule (0.4 mg total) by mouth at bedtime. 30 capsule 2  . thioridazine (MELLARIL) 100 MG tablet Take 200 mg by mouth at bedtime.     Marland Kitchen thioridazine (MELLARIL) 50 MG tablet Take 1 tablet (50 mg total) by mouth every morning. 30 tablet 10   No current facility-administered medications on file prior to visit.     Current Medications (verified) Current Outpatient Prescriptions  Medication Sig Dispense Refill  . aspirin 81 MG tablet Take 81 mg by mouth daily.    . Blood Glucose Monitoring Suppl (ACCU-CHEK AVIVA PLUS) W/DEVICE KIT DX E11.9 1 kit  0  . diazepam (VALIUM) 5 MG tablet Take one tablet 30 minutes prior to MR (on arrival to facility) and one more just prior to MR (if needed) 2 tablet 0  . dutasteride (AVODART) 0.5 MG capsule Take 1 capsule (0.5 mg total) by mouth daily. 30 capsule 5  . Emollient (CERAVE) CREA Apply 1 application topically. For dry skin on feet and legs    . ferrous sulfate 325 (65 FE) MG tablet Take 1 tablet (325 mg total) by mouth 2 (two) times daily with a meal. 60 tablet 5  . FLUoxetine (PROZAC) 10 MG capsule Take 1 capsule by mouth daily.    Marland Kitchen glucose blood (ACCU-CHEK AVIVA) test strip Use as instructed to check blood sugar once a day.  DX  E11.9 100 each 1  . ketoconazole (NIZORAL) 2 % cream Apply 1 application topically 2 (two) times daily. 3 days a week, Monday, Wednesday and friday 15 g   . Lancet Devices (ACCU-CHEK SOFTCLIX) lancets Use as instructed  DX E11.9 10 each 1  . levothyroxine (SYNTHROID, LEVOTHROID) 100 MCG tablet Take 1 tablet (100 mcg total) by mouth daily. 30 tablet 5  . lip balm (BLISTEX) OINT Apply 1 application topically 4 (four) times daily as needed for lip care.    . Loperamide HCl (IMODIUM A-D PO) Take by mouth as needed.    . Nutritional Supplements (NUTRITIONAL SHAKE PLUS) LIQD Drink 1 shake by mouth daily. 30 Bottle 5  . polyethylene glycol powder (GLYCOLAX/MIRALAX) powder 1 packet in 8 oz of water or juice once daily AS NEEDED for constipation 3350 g 1  . QUEtiapine (SEROQUEL) 50 MG tablet Take 50 mg by mouth 2 (two) times daily.    . Simethicone (MYLANTA GAS PO) Take by mouth as needed.    . tamsulosin (FLOMAX) 0.4 MG CAPS capsule Take 1 capsule (0.4 mg total) by mouth at bedtime. 30 capsule 2  . thioridazine (MELLARIL) 100 MG tablet Take 200 mg by mouth at bedtime.     Marland Kitchen thioridazine (MELLARIL) 50 MG tablet Take 1 tablet (50 mg total) by mouth every morning. 30 tablet 10   No current facility-administered medications for this visit.      Allergies (verified) Patient has no  known allergies.   PAST HISTORY  Family History Family History  Problem Relation Age of Onset  . Arthritis Mother   . Arrhythmia Mother   . Lymphoma Mother     non-hodgkins  . Kidney disease Mother   . Heart disease Father   . Stroke Father     Social History Social History  Substance Use Topics  . Smoking status: Never Smoker  . Smokeless tobacco: Current User    Types: Chew     Comment: chews tobacco  . Alcohol use No    Are there smokers in your home (other than you)?  No  Risk Factors Current exercise habits: decreased mobility/uses walker  Dietary issues discussed: continue ensure once daily.    Cardiac risk factors: advanced age (older than 76 for men, 39 for women), diabetes mellitus and male gender.  Depression Screen (Note: if answer to either of the following is "Yes", a more complete depression screening is indicated)   Q1: Over the past two weeks, have you felt down, depressed or hopeless? No  Q2: Over the past two weeks, have you felt little interest or pleasure in doing things? No  Have you lost interest or pleasure in daily life? No  Do you often feel hopeless? No  Do you cry easily over simple problems? No  Activities of Daily Living In your present state of health, do you have any difficulty performing the following activities?:  Driving? Yes Managing money?  Yes Feeding yourself? Yes Getting from bed to chair? Yes Climbing a flight of stairs? Yes Preparing food and eating?: Yes Bathing or showering? Yes Getting dressed: Yes Getting to the toilet? Yes Using the toilet:Yes Moving around from place to place: Yes In the past year have you fallen or had a near fall?:Yes   Are you sexually active?  No  Do you have more than one partner?  No  Hearing Difficulties: Yes Do you often ask people to speak up or repeat themselves? sometimes Do you experience ringing or noises in your ears? No Do you have difficulty understanding soft or whispered  voices? Yes   Do you feel that you have a problem with memory? Mild memory loss  Do you often misplace items? No  Do you feel safe at home?  Yes  Cognitive Testing  Alert? Yes  Normal Appearance?Yes  Oriented to person? No  Place? No   Time? No  Recall of three objects?  No  Can perform simple calculations? No  Displays appropriate judgment?No  Can read the correct time from a watch face?No   Advanced Directives have been discussed with the patient? Yes   List the Names of Other Physician/Practitioners you currently use: 1.    Indicate any recent Medical Services you may have received from other than [facility] providers in the past year (date may be approximate).  Immunization History  Administered Date(s) Administered  . Influenza Split 04/10/2012  . Influenza, High Dose Seasonal PF 05/20/2015, 04/14/2016  . Influenza,inj,Quad PF,36+ Mos 03/23/2013, 04/02/2014  . PPD Test 05/19/2012, 05/29/2012  . Pneumococcal Conjugate-13 04/02/2014  . Pneumococcal Polysaccharide-23 04/10/2012  . Tdap 02/05/2013, 05/31/2014  . Zoster 07/26/2008    Screening Tests Health Maintenance  Topic Date Due  . URINE MICROALBUMIN  08[birth year]28/1953  . FOOT EXAM  08/22/2015  . Hepatitis C Screening  05/31/2017 (Originally [birth year])  . HEMOGLOBIN A1C  06/16/2016  . OPHTHALMOLOGY EXAM  08/26/2016  . TETANUS/TDAP  05/31/2024  . COLONOSCOPY  10/01/2024  . INFLUENZA VACCINE  Completed  . ZOSTAVAX  Completed  . PNA vac Low Risk Adult  Completed    All answers were reviewed with the patient and necessary referrals were made:  O'SULLIVAN,Toya Palacios S., NP   05/31/2016   History reviewed: allergies, current medications, past family history, past medical history, past social history, past surgical history and problem list  Review of Systems Pertinent items are noted in HPI.    Objective:     Vision by Snellen chart: right MBE:MLJQGB to measure, left EEF:EOFHQR to measure Blood pressure (!) 137/59, pulse  90, temperature 98.5 F (36.9 C), temperature source Oral, resp.  rate 18, height _0  (1.778 m), weight 166 lb 12.8 oz (75.7 kg), SpO2 98 %. Body mass index is 23.93 kg/m.  Physical Exam  Constitutional: He is oriented to person, place, and time. He appears well-developed and well-nourished. No distress.  HENT:  Head: Normocephalic and atraumatic.  Right Ear: Tympanic membrane and ear canal normal.  Left Ear: Tympanic membrane and ear canal normal.  Mouth/Throat: Oropharynx is clear and moist.  Eyes: Pupils are equal, round, and reactive to light. No scleral icterus.  Neck: Normal range of motion. No thyromegaly present.  Cardiovascular: Normal rate and regular rhythm.   No murmur heard. Pulmonary/Chest: Effort normal and breath sounds normal. No respiratory distress. He has no wheezes. He has no rales. He exhibits no tenderness.  Abdominal: Soft. Bowel sounds are normal. He exhibits no distension and no mass. There is no tenderness. There is no rebound and no guarding.  Musculoskeletal: He exhibits no edema.  Lymphadenopathy:    He has no cervical adenopathy.  Neurological: left hand contracture, left hand tremor Skin: Skin is warm and dry.  Psychiatric: He has a normal mood abnormal affect but pleasant        Assessment & Plan:        Assessment:          Plan:     During the course of the visit the patient was educated and counseled about appropriate screening and preventive services including:    Colorectal cancer screening  Advanced directives: sister is HCPOA   Diet review for nutrition referral? Yes ____  Not Indicated __x__   Patient Instructions (the written plan) was given to the patient.  Medicare Attestation I have personally reviewed: The patient's medical and social history Their use of alcohol, tobacco or illicit drugs Their current medications and supplements The patient's functional ability including ADLs,fall risks, home safety risks,  cognitive, and hearing and visual impairment Diet and physical activities Evidence for depression or mood disorders  The patient's weight, height, BMI, and visual acuity have been recorded in the chart.  I have made referrals, counseling, and provided education to the patient based on review of the above and I have provided the patient with a written personalized care plan for preventive services.     O'SULLIVAN,Griselle Rufer S., NP   05/31/2016

## 2016-05-31 NOTE — Patient Instructions (Signed)
Please complete lab work prior to leaving.   

## 2016-06-02 ENCOUNTER — Telehealth: Payer: Self-pay | Admitting: *Deleted

## 2016-06-02 ENCOUNTER — Ambulatory Visit: Payer: Self-pay | Admitting: Neurology

## 2016-06-02 LAB — TB SKIN TEST
INDURATION: 0 mm
TB SKIN TEST: NEGATIVE

## 2016-06-02 NOTE — Telephone Encounter (Signed)
Per verbal from PCP, CPE form was sent for scanning yesterday and pt was supposed to return for PPD reading today. If PPD negative form could be given to pt. Spoke to caregiver, Butch Penny and told her we would fax form once it was complete. Fax # [phone]. Spoke with Butch Penny and she will return to pick up form. Form given to caregiver and copy sent for scanning.

## 2016-06-04 ENCOUNTER — Ambulatory Visit (INDEPENDENT_AMBULATORY_CARE_PROVIDER_SITE_OTHER): Payer: Medicare Other | Admitting: Neurology

## 2016-06-04 ENCOUNTER — Encounter: Payer: Self-pay | Admitting: Neurology

## 2016-06-04 ENCOUNTER — Other Ambulatory Visit (INDEPENDENT_AMBULATORY_CARE_PROVIDER_SITE_OTHER): Payer: Medicare Other

## 2016-06-04 VITALS — BP 116/60 | HR 106 | Ht 70.0 in | Wt 168.0 lb

## 2016-06-04 DIAGNOSIS — R531 Weakness: Secondary | ICD-10-CM | POA: Diagnosis not present

## 2016-06-04 DIAGNOSIS — G1220 Motor neuron disease, unspecified: Secondary | ICD-10-CM

## 2016-06-04 DIAGNOSIS — R296 Repeated falls: Secondary | ICD-10-CM | POA: Diagnosis not present

## 2016-06-04 DIAGNOSIS — G1221 Amyotrophic lateral sclerosis: Secondary | ICD-10-CM | POA: Insufficient documentation

## 2016-06-04 DIAGNOSIS — R253 Fasciculation: Secondary | ICD-10-CM

## 2016-06-04 HISTORY — DX: Amyotrophic lateral sclerosis: G12.21

## 2016-06-04 LAB — VITAMIN B12: VITAMIN B 12: 641 pg/mL (ref 211–911)

## 2016-06-04 LAB — CK: CK TOTAL: 34 U/L (ref 7–232)

## 2016-06-04 NOTE — Progress Notes (Signed)
Follow-up Visit   Date: 06/04/16    CLARANCE BOLLARD MRN: [id number] DOB: 1946/06/05   Interim History: MICHALL NOFFKE is a 70 y.o. male with congenital cognitive impairment, diabetes, schizophrenia, hypertension, hypothyroidism, and BPH referred opinion of generalized muscle weakness and atrophy.  The patient was accompanied to the clinic by sister and group home personnel who provides nearly all of his history due to the patient's severity of cognitive impairment.    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  He has long history of incontinence as well as choking on food, because of not chewing.  He currently resides at a group home where he receives care for bath, dressing, and gait.  He is able to feed himself, but is always under supervision, because of being a high choke risk.    Starting in February 2017, he began having generalized weakness of the arms and legs. His sister noticed that his hands started having muscle wasting and he was having greater difficulty holding objects.  Since September, he has severe weakness of the left hand where he is unable to extend the fingers.  He has been walking with a walker for the past 4 years, however, for the past three months, he has started to require a gait belt.  He has been falling more than usual, and has been hospitalized twice in the past few months.    He saw Dr. Carles Collet for new left hand and face tremor.  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  MRI cervical spine does not show compressive myelopathy to explain the severity of his symptoms.  Patient was referred to see me for further evaluation.  Patient denies shortness of breath, cramps, or pain.    Medications:  Current Outpatient Prescriptions on File Prior to Visit  Medication Sig  Dispense Refill  . aspirin 81 MG tablet Take 81 mg by mouth daily.    . Blood Glucose Monitoring Suppl (ACCU-CHEK AVIVA PLUS) W/DEVICE KIT DX E11.9 1 kit 0  . dutasteride (AVODART) 0.5 MG capsule Take 1 capsule (0.5 mg total) by mouth daily. 30 capsule 5  . Emollient (CERAVE) CREA Apply 1 application topically. For dry skin on feet and legs    . ferrous sulfate 325 (65 FE) MG tablet Take 1 tablet (325 mg total) by mouth 2 (two) times daily with a meal. 60 tablet 5  . FLUoxetine (PROZAC) 10 MG capsule Take 1 capsule by mouth daily.    Marland Kitchen glucose blood (ACCU-CHEK AVIVA) test strip Use as instructed to check blood sugar once a day.  DX  E11.9 100 each 1  . ketoconazole (NIZORAL) 2 % cream Apply 1 application topically 2 (two) times daily. 3 days a week, Monday, Wednesday and friday 15 g   . Lancet Devices (ACCU-CHEK SOFTCLIX) lancets Use as instructed  DX E11.9 10 each 1  . levothyroxine (SYNTHROID, LEVOTHROID) 100 MCG tablet Take 1 tablet (100 mcg total) by mouth daily. 30 tablet 5  . lip balm (BLISTEX) OINT Apply 1 application topically 4 (four) times daily as needed for lip care.    . Loperamide HCl (IMODIUM A-D PO) Take by mouth as needed.    . Nutritional Supplements (NUTRITIONAL SHAKE PLUS) LIQD Drink 1 shake by mouth daily. 30 Bottle 5  . polyethylene glycol powder (GLYCOLAX/MIRALAX) powder 1 packet  in 8 oz of water or juice once daily AS NEEDED for constipation 3350 g 1  . QUEtiapine (SEROQUEL) 50 MG tablet Take 50 mg by mouth 2 (two) times daily.    . Simethicone (MYLANTA GAS PO) Take by mouth as needed.    . tamsulosin (FLOMAX) 0.4 MG CAPS capsule Take 1 capsule (0.4 mg total) by mouth at bedtime. 30 capsule 2  . thioridazine (MELLARIL) 100 MG tablet Take 200 mg by mouth at bedtime.     Marland Kitchen thioridazine (MELLARIL) 50 MG tablet Take 1 tablet (50 mg total) by mouth every morning. 30 tablet 10   No current facility-administered medications on file prior to visit.     Allergies: No Known  Allergies  Review of Systems:  CONSTITUTIONAL: No fevers, chills, night sweats, or weight loss.  EYES: No visual changes or eye pain ENT: No hearing changes.  No history of nose bleeds.   RESPIRATORY: No cough, wheezing and shortness of breath.   CARDIOVASCULAR: Negative for chest pain, and palpitations.   GI: Negative for abdominal discomfort, blood in stools or black stools.  No recent change in bowel habits.   GU:  +history of incontinence.   MUSCLOSKELETAL: No history of joint pain or swelling.  No myalgias.   SKIN: Negative for lesions, rash, and itching.   ENDOCRINE: Negative for cold or heat intolerance, polydipsia or goiter.   PSYCH:  + depression or anxiety symptoms.   NEURO: As Above.   Vital Signs:  BP 116/60   Pulse (!) 106   Ht 5' 10"  (1.778 m)   Wt 168 lb (76.2 kg)   SpO2 97%   BMI 24.11 kg/m   Neurological Exam: MENTAL STATUS including orientation to time, place, person, recent and remote memory, attention span and concentration, language, and fund of knowledge is extremely poor.  History is obtained through sister.  His speech is slurred and there is paucity of speech.  He does not follow simple commands, even with repeated encouragement.    CRANIAL NERVES:  Pupils equal round and reactive to light.  Normal conjugate, extra-ocular eye movements in all directions of gaze.  No ptosis. Normal facial sensation.  Face is symmetric. Palate elevates symmetrically.  Tongue is midline, no fasciculations.  There is mild facial weakness of buccinator testing 4/5, again this may be effort dependent.  Pathological facial reflexes are present.   MOTOR:  Because of his cognitive deficits, accurate assessment of his motor strength is difficult.  Neck flexion is 4/5.  He is 4+/5 proximally in the arms and legs.  Distal hand strength is very weak, 3+/5 on the right, 1/5 finger extension on the left.  ABP is 2/5 on the right, 0/5 on the left.  There is profound atrophy of the intrinsic  hand muscles with evidence of split hand on the right.  Left forearm and left TA is also atrophied.  In the lower extremities, there is left foot drop with dorsiflexion 2/5.  Tone is normal throughout.  Fasciculations are present in the forearm and medial gastrocnemius muscles bilaterally.  Left hand tremor and head tremor is present.   MSRs:  Right  Left brachioradialis 2+  brachioradialis 2+  biceps 2+  biceps 2+  triceps 2+  triceps 2+  patellar 3+  patellar 3+  ankle jerk 2+  ankle jerk 2+  Hoffman no  Hoffman no  plantar response down  plantar response down   COORDINATION/GAIT:  Gait is not tested.  He is able to stand with assistance, but appears very unsteady  Data: MRI brain wo contrast 03/30/2016: No acute intracranial abnormality. Remote small left cerebellar infarct. Mild to moderate chronic microvascular changes. Mild global atrophy without hydrocephalus. Minimal to mild paranasal sinus mucosal thickening most notable involving the maxillary sinuses.  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  Lab Results  Component Value Date   TSH 2.83 12/15/2015    IMPRESSION/PLAN: Mr. Allsup is a 70 year-old gentleman referred for evaluation of progressive muscle weakness and atrophy since February 2017. He has severe wasting of the intrinsic hand muscles, worse on the left, as well as left foot drop.  Motor strength testing is greatly hampered by patients cognition and inability to follow commands.  There is evidence of lower motor neuron dysfunction involving the bulbar, cervical and lumbosacral region with upper motor neuron involving the bulbar region.  His work-up has included MRI brain and cervical spine, which does not show intracranial etiology or compressive myelopathy.  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  Unfortunately, we are unable to perform NCS/EMG as the patient's cognitive deficits would not allow this to be performed safely and accurately.  I had a lengthy discussion with his sister about the pathogenesis, etiology, management, and natural course of motor neuron disease.  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  He will be followed clinically to see how his symptoms evolve.   1.  Check CK, aldolase, vitamin B12, copper, SPEP with IFE  2.  Riluzole was declined 3.  Extensively discussed the prognosis and that he will need greater level of care as the disease progresses.  Sister and caregiver also counseled on signs of worsening disease such as dysphagia, shortness of breath, and weakness.  4.  Advanced directive also discussed, sister states that they do not have this formalized, but they do not want aggressive life saving measures  Return to clinic in 4 months  The duration of this appointment visit was 60 minutes of face-to-face time with the patient.  Greater than 50% of this time was spent in counseling, explanation of diagnosis, planning of further management, and coordination of care.   Thank you for allowing me to participate in patient's care.  If I can answer any additional questions, I would be pleased to do so.    Sincerely,    Rajan Burgard K. Posey Pronto, DO

## 2016-06-04 NOTE — Patient Instructions (Signed)
1.  We will call you with the results of your labs 2.  Return to clinic in 4 months, or sooner as needed

## 2016-06-07 LAB — COPPER, SERUM: COPPER: 146 ug/dL (ref 72–166)

## 2016-06-07 LAB — ALDOLASE: ALDOLASE: 3.2 U/L (ref <=8.1)

## 2016-06-08 LAB — PROTEIN ELECTROPHORESIS, SERUM
ALPHA-1-GLOBULIN: 0.4 g/dL — AB (ref 0.2–0.3)
Albumin ELP: 2.8 g/dL — ABNORMAL LOW (ref 3.8–4.8)
Alpha-2-Globulin: 0.8 g/dL (ref 0.5–0.9)
BETA 2: 0.5 g/dL (ref 0.2–0.5)
Beta Globulin: 0.4 g/dL (ref 0.4–0.6)
GAMMA GLOBULIN: 1.7 g/dL (ref 0.8–1.7)
TOTAL PROTEIN, SERUM ELECTROPHOR: 6.6 g/dL (ref 6.1–8.1)

## 2016-06-08 LAB — IMMUNOFIXATION ELECTROPHORESIS
IGG (IMMUNOGLOBIN G), SERUM: 1801 mg/dL — AB (ref 694–1618)
IgA: 586 mg/dL — ABNORMAL HIGH (ref 81–463)
IgM, Serum: 85 mg/dL (ref 48–271)

## 2016-06-09 ENCOUNTER — Encounter: Payer: Self-pay | Admitting: *Deleted

## 2016-06-10 DIAGNOSIS — T148XXA Other injury of unspecified body region, initial encounter: Secondary | ICD-10-CM | POA: Diagnosis not present

## 2016-06-10 DIAGNOSIS — E119 Type 2 diabetes mellitus without complications: Secondary | ICD-10-CM | POA: Diagnosis not present

## 2016-06-10 DIAGNOSIS — S199XXA Unspecified injury of neck, initial encounter: Secondary | ICD-10-CM | POA: Diagnosis not present

## 2016-06-10 DIAGNOSIS — R51 Headache: Secondary | ICD-10-CM | POA: Diagnosis not present

## 2016-06-10 DIAGNOSIS — F0390 Unspecified dementia without behavioral disturbance: Secondary | ICD-10-CM | POA: Diagnosis not present

## 2016-06-10 DIAGNOSIS — M542 Cervicalgia: Secondary | ICD-10-CM | POA: Diagnosis not present

## 2016-06-10 DIAGNOSIS — F79 Unspecified intellectual disabilities: Secondary | ICD-10-CM | POA: Diagnosis not present

## 2016-06-10 DIAGNOSIS — S0990XA Unspecified injury of head, initial encounter: Secondary | ICD-10-CM | POA: Diagnosis not present

## 2016-06-10 DIAGNOSIS — T1490XA Injury, unspecified, initial encounter: Secondary | ICD-10-CM | POA: Diagnosis not present

## 2016-06-10 DIAGNOSIS — F209 Schizophrenia, unspecified: Secondary | ICD-10-CM | POA: Diagnosis not present

## 2016-06-10 DIAGNOSIS — I10 Essential (primary) hypertension: Secondary | ICD-10-CM | POA: Diagnosis not present

## 2016-06-10 DIAGNOSIS — S0003XA Contusion of scalp, initial encounter: Secondary | ICD-10-CM | POA: Diagnosis not present

## 2016-06-11 DIAGNOSIS — R2689 Other abnormalities of gait and mobility: Secondary | ICD-10-CM | POA: Diagnosis not present

## 2016-06-11 DIAGNOSIS — R259 Unspecified abnormal involuntary movements: Secondary | ICD-10-CM | POA: Diagnosis not present

## 2016-07-01 DIAGNOSIS — R339 Retention of urine, unspecified: Secondary | ICD-10-CM | POA: Diagnosis not present

## 2016-07-01 DIAGNOSIS — N390 Urinary tract infection, site not specified: Secondary | ICD-10-CM | POA: Diagnosis not present

## 2016-07-06 ENCOUNTER — Ambulatory Visit (INDEPENDENT_AMBULATORY_CARE_PROVIDER_SITE_OTHER): Payer: Medicare Other | Admitting: Family

## 2016-07-06 ENCOUNTER — Encounter: Payer: Self-pay | Admitting: Family

## 2016-07-06 VITALS — BP 140/66 | HR 85 | Temp 98.3°F | Resp 16 | Ht 70.0 in | Wt 167.6 lb

## 2016-07-06 DIAGNOSIS — R3911 Hesitancy of micturition: Secondary | ICD-10-CM

## 2016-07-06 LAB — POC URINALSYSI DIPSTICK (AUTOMATED)
BILIRUBIN UA: NEGATIVE
GLUCOSE UA: NEGATIVE
KETONES UA: NEGATIVE
NITRITE UA: NEGATIVE
Spec Grav, UA: 1.02
Urobilinogen, UA: NEGATIVE
pH, UA: 6

## 2016-07-06 MED ORDER — CIPROFLOXACIN HCL 250 MG PO TABS: 250.0000 mg | ORAL_TABLET | Freq: Two times a day (BID) | ORAL | 0 refills | Status: DC

## 2016-07-06 NOTE — Progress Notes (Signed)
Subjective:    Patient ID: Edward [name], male    DOB: June 24, 1946, 70 y.o.   MRN: [id number]  HPI  Edward [name] is a 70 yr old male who presents today with his caregiver Butch Penny from his group home. Pt is unable to provide history.  She reports patient has had some urinary hesitancy.  Urine is noted to be cloudy with a strong odor. Caregiver notes that he went to the Urologist last week and when they tried to pull back the foreskin he developed some tearing of the foreskin.   Caregiver notes generalized weakness, has good days and bad days in terms of his mobility. This is unchanged from his baseline. 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 They are working to get him transferred to an [facility] which has a better staff/resident ratio to better meet his care needs.    Review of Systems See HPI  Past Medical History:  Diagnosis Date  . Anemia   . BPH (benign prostatic hyperplasia)   . Cataract   . Diabetes mellitus   . Hypertension    DENIES  . Mental retardation   . Schizophrenia ([employer])   . Thyroid disease   . Urinary incontinence      Social History   Social History  . Marital status: Single    Spouse name: N/A  . Number of children: N/A  . Years of education: N/A   Occupational History  . Not on file.   Social History Main Topics  . Smoking status: Never Smoker  . Smokeless tobacco: Current User    Types: Chew     Comment: chews tobacco  . Alcohol use No  . Drug use: No  . Sexual activity: Not on file   Other Topics Concern  . Not on file   Social History Narrative   Lives in a group home   Sister [name] is his legal guardian.   Sister and her family live locally    Past Surgical History:  Procedure Laterality Date  . PROSTATE SURGERY     x2, ?procedure    Family History  Problem Relation Age of Onset  . Arthritis Mother   . Arrhythmia Mother   . Lymphoma Mother    non-hodgkins  . Kidney disease Mother   . Heart disease Father   . Stroke Father     No Known Allergies  Current Outpatient Prescriptions on File Prior to Visit  Medication Sig Dispense Refill  . aspirin 81 MG tablet Take 81 mg by mouth daily.    . Blood Glucose Monitoring Suppl (ACCU-CHEK AVIVA PLUS) W/DEVICE KIT DX E11.9 1 kit 0  . dutasteride (AVODART) 0.5 MG capsule Take 1 capsule (0.5 mg total) by mouth daily. 30 capsule 5  . Emollient (CERAVE) CREA Apply 1 application topically. For dry skin on feet and legs    . ferrous sulfate 325 (65 FE) MG tablet Take 1 tablet (325 mg total) by mouth 2 (two) times daily with a meal. 60 tablet 5  . FLUoxetine (PROZAC) 10 MG capsule Take 1 capsule by mouth daily.    Marland Kitchen glucose blood (ACCU-CHEK AVIVA) test strip Use as instructed to check blood sugar once a day.  DX  E11.9 100 each 1  . ketoconazole (NIZORAL) 2 % cream Apply 1 application topically 2 (two) times daily. 3 days a week, Monday, Wednesday and friday 15 g   .  Lancet Devices (ACCU-CHEK SOFTCLIX) lancets Use as instructed  DX E11.9 10 each 1  . levothyroxine (SYNTHROID, LEVOTHROID) 100 MCG tablet Take 1 tablet (100 mcg total) by mouth daily. 30 tablet 5  . lip balm (BLISTEX) OINT Apply 1 application topically 4 (four) times daily as needed for lip care.    . Loperamide HCl (IMODIUM A-D PO) Take by mouth as needed.    . Nutritional Supplements (NUTRITIONAL SHAKE PLUS) LIQD Drink 1 shake by mouth daily. 30 Bottle 5  . polyethylene glycol powder (GLYCOLAX/MIRALAX) powder 1 packet in 8 oz of water or juice once daily AS NEEDED for constipation 3350 g 1  . QUEtiapine (SEROQUEL) 50 MG tablet Take 50 mg by mouth 2 (two) times daily.    . Simethicone (MYLANTA GAS PO) Take by mouth as needed.    . tamsulosin (FLOMAX) 0.4 MG CAPS capsule Take 1 capsule (0.4 mg total) by mouth at bedtime. 30 capsule 2  . thioridazine (MELLARIL) 100 MG tablet Take 200 mg by mouth at bedtime.     Marland Kitchen thioridazine  (MELLARIL) 50 MG tablet Take 1 tablet (50 mg total) by mouth every morning. 30 tablet 10   No current facility-administered medications on file prior to visit.     BP 140/66 (BP Location: Right Arm, Cuff Size: Normal)   Pulse 85   Temp 98.3 F (36.8 C) (Oral)   Resp 16   Ht 5' 10"  (1.778 m)   Wt 167 lb 9.6 oz (76 kg)   SpO2 100% Comment: room air  BMI 24.05 kg/m       Objective:   Physical Exam  Constitutional: He appears well-developed and well-nourished. No distress.  Cardiovascular: Normal rate and regular rhythm.   No murmur heard. Pulmonary/Chest: Effort normal and breath sounds normal. No respiratory distress. He has no wheezes. He has no rales.  Genitourinary:  Genitourinary Comments: Examined penis- no obvious skin tear today.  Very cloudy, white, foul smelling urine is collected via clean catch  Musculoskeletal: He exhibits no edema.  Neurological: He is alert.  (chaperone present for visit)        Assessment & Plan:  UTI-  UA notes 3+ leuks, 1+ protein.  Will start cipro and send urine for culture. I did instruct the caregiver that should his condition change/deteriorate then they would need to bring him the ED.  I did sign the FL2 for his higher level facility.

## 2016-07-06 NOTE — Progress Notes (Signed)
Pre visit review using our clinic review tool, if applicable. No additional management support is needed unless otherwise documented below in the visit note. 

## 2016-07-06 NOTE — Patient Instructions (Signed)
Begin Cipro for urinary tract infection. Call if Bill develops fever, confusion or increased weakness.

## 2016-07-07 LAB — URINE CULTURE: Organism ID, Bacteria: NO GROWTH

## 2016-07-08 ENCOUNTER — Telehealth: Payer: Self-pay | Admitting: Family

## 2016-07-08 NOTE — Telephone Encounter (Signed)
Surprisingly, the urine did not grow any bacteria.  Could you please contact caregiver and ask how he is doing.  Has the urinary odor improved, is he voiding without difficulty?

## 2016-07-12 NOTE — Telephone Encounter (Signed)
Called Butch Penny and left a message for call back.    Called and spoke to [place], pt's legal guardian (on pt's DPR).  She said she saw patient on Saturday.  At that time he was complaining of left sided back pain.  He described the pain as "something is pulling," but Sybill says pt isn't always the best at describing things.  However,she could tell he was uncomfortable.  She's unsure of urine odor, color or if he's voiding without difficulty.  She says she will not go back out to group home until Wednesday (07/14/16), therefore she will call group home and have them call us.  Awaiting call back.

## 2016-07-13 NOTE — Telephone Encounter (Signed)
Spoke with Golda Acre and she states that the Cipro seems to have helped but pt still appears to be in quite a bit of discomfort.  Was walking bent over on Saturday. States that he is not hurting but feels a pulling sensation. [name] contacted pt's urologist and he is out of the office most of the week. Was told that they could have a nurse tech see him and do an xray. [name] wants to know if you think they should do that or follow up with PCP here? Please advise?

## 2016-07-13 NOTE — Telephone Encounter (Signed)
Notified pt's sister and scheduled appt for tomorrow at 1:30pm.

## 2016-07-13 NOTE — Telephone Encounter (Signed)
Caller name: Ilay Verhoff Relationship to patient: sister Can be reached: [phone]  Reason for call: Pt legal guardian, sister, Golda Acre is requesting call back from [place] or [place] specifically about lab results.

## 2016-07-13 NOTE — Telephone Encounter (Signed)
Lets schedule him here with a provider.

## 2016-07-14 ENCOUNTER — Encounter: Payer: Self-pay | Admitting: Family

## 2016-07-14 ENCOUNTER — Ambulatory Visit (INDEPENDENT_AMBULATORY_CARE_PROVIDER_SITE_OTHER): Payer: Medicare Other | Admitting: Family

## 2016-07-14 VITALS — BP 122/62 | HR 85 | Temp 98.3°F | Resp 16 | Ht 70.0 in | Wt 168.8 lb

## 2016-07-14 DIAGNOSIS — G8929 Other chronic pain: Secondary | ICD-10-CM

## 2016-07-14 DIAGNOSIS — M545 Low back pain: Secondary | ICD-10-CM | POA: Diagnosis not present

## 2016-07-14 NOTE — Progress Notes (Signed)
Pre visit review using our clinic review tool, if applicable. No additional management support is needed unless otherwise documented below in the visit note. 

## 2016-07-14 NOTE — Patient Instructions (Signed)
Merry Christmas

## 2016-07-14 NOTE — Progress Notes (Signed)
Subjective:    Patient ID: Edward [name], male    DOB: Jul 30, 1945, 70 y.o.   MRN: [id number]  HPI  Mr.  Edward [name] is a 70 yr old male who presents today for follow up.  Last visit he was noted to have very foul smelling cloudy urine.  He was treated with cipro.  Interestingly, his culture did not grow any bacteria.  He is brought today by his sister who noted that the patient complained of some left low back pain yesterday. Today the patient denies dysuria and denies low back pain.   Review of Systems    see HPI  Past Medical History:  Diagnosis Date  . Anemia   . BPH (benign prostatic hyperplasia)   . Cataract   . Diabetes mellitus   . Hypertension    DENIES  . Mental retardation   . Schizophrenia ([place])   . Thyroid disease   . Urinary incontinence      Social History   Social History  . Marital status: Single    Spouse name: N/A  . Number of children: N/A  . Years of education: N/A   Occupational History  . Not on file.   Social History Main Topics  . Smoking status: Never Smoker  . Smokeless tobacco: Current User    Types: Chew     Comment: chews tobacco  . Alcohol use No  . Drug use: No  . Sexual activity: Not on file   Other Topics Concern  . Not on file   Social History Narrative   Lives in a group home   Sister Edward [name] is his legal guardian.   Sister and her family live locally    Past Surgical History:  Procedure Laterality Date  . PROSTATE SURGERY     x2, ?procedure    Family History  Problem Relation Age of Onset  . Arthritis Mother   . Arrhythmia Mother   . Lymphoma Mother     non-hodgkins  . Kidney disease Mother   . Heart disease Father   . Stroke Father     No Known Allergies  Current Outpatient Prescriptions on File Prior to Visit  Medication Sig Dispense Refill  . aspirin 81 MG tablet Take 81 mg by mouth daily.    . Blood Glucose Monitoring Suppl (ACCU-CHEK AVIVA PLUS) W/DEVICE KIT DX E11.9 1 kit 0  . dutasteride  (AVODART) 0.5 MG capsule Take 1 capsule (0.5 mg total) by mouth daily. 30 capsule 5  . Emollient (CERAVE) CREA Apply 1 application topically. For dry skin on feet and legs    . ferrous sulfate 325 (65 FE) MG tablet Take 1 tablet (325 mg total) by mouth 2 (two) times daily with a meal. 60 tablet 5  . FLUoxetine (PROZAC) 10 MG capsule Take 1 capsule by mouth daily.    Marland Kitchen glucose blood (ACCU-CHEK AVIVA) test strip Use as instructed to check blood sugar once a day.  DX  E11.9 100 each 1  . ketoconazole (NIZORAL) 2 % cream Apply 1 application topically 2 (two) times daily. 3 days a week, Monday, Wednesday and friday 15 g   . Lancet Devices (ACCU-CHEK SOFTCLIX) lancets Use as instructed  DX E11.9 10 each 1  . levothyroxine (SYNTHROID, LEVOTHROID) 100 MCG tablet Take 1 tablet (100 mcg total) by mouth daily. 30 tablet 5  . lip balm (BLISTEX) OINT Apply 1 application topically 4 (four) times daily as needed for lip care.    . Loperamide HCl (IMODIUM  A-D PO) Take by mouth as needed.    . Nutritional Supplements (NUTRITIONAL SHAKE PLUS) LIQD Drink 1 shake by mouth daily. 30 Bottle 5  . polyethylene glycol powder (GLYCOLAX/MIRALAX) powder 1 packet in 8 oz of water or juice once daily AS NEEDED for constipation 3350 g 1  . QUEtiapine (SEROQUEL) 50 MG tablet Take 50 mg by mouth 2 (two) times daily.    . Simethicone (MYLANTA GAS PO) Take by mouth as needed.    . tamsulosin (FLOMAX) 0.4 MG CAPS capsule Take 1 capsule (0.4 mg total) by mouth at bedtime. 30 capsule 2  . thioridazine (MELLARIL) 100 MG tablet Take 200 mg by mouth at bedtime.     Marland Kitchen thioridazine (MELLARIL) 50 MG tablet Take 1 tablet (50 mg total) by mouth every morning. 30 tablet 10   No current facility-administered medications on file prior to visit.     BP 122/62 (BP Location: Right Arm, Cuff Size: Normal)   Pulse 85   Temp 98.3 F (36.8 C) (Oral)   Resp 16   Ht 5' 10"  (1.778 m)   Wt 168 lb 12.8 oz (76.6 kg)   SpO2 100% Comment: room air   BMI 24.22 kg/m    Objective:   Physical Exam  Constitutional: He appears well-developed and well-nourished. No distress.  HENT:  Head: Normocephalic and atraumatic.  Cardiovascular: Normal rate and regular rhythm.   No murmur heard. Pulmonary/Chest: Effort normal and breath sounds normal. No respiratory distress. He has no wheezes. He has no rales.  Musculoskeletal: He exhibits no edema.  Neurological: He is alert.  Skin: Skin is warm and dry.          Assessment & Plan:  Low back pain- resolved.  Monitor. No current urinary symptoms. His sister is working on getting him transferred to a higher level of care within the state system.

## 2016-07-30 ENCOUNTER — Ambulatory Visit: Payer: Self-pay | Admitting: Family

## 2016-07-30 ENCOUNTER — Ambulatory Visit (INDEPENDENT_AMBULATORY_CARE_PROVIDER_SITE_OTHER): Payer: Medicare Other | Admitting: Family

## 2016-07-30 ENCOUNTER — Encounter: Payer: Self-pay | Admitting: Family

## 2016-07-30 VITALS — BP 138/80 | HR 86 | Temp 97.9°F | Resp 16 | Ht 70.0 in | Wt 172.6 lb

## 2016-07-30 DIAGNOSIS — M549 Dorsalgia, unspecified: Secondary | ICD-10-CM

## 2016-07-30 MED ORDER — MELOXICAM 7.5 MG PO TABS: 7.5000 mg | ORAL_TABLET | Freq: Every day | ORAL | 0 refills | Status: DC

## 2016-07-30 MED ORDER — MELOXICAM 7.5 MG PO TABS: 7.5000 mg | ORAL_TABLET | Freq: Every day | ORAL | 0 refills | Status: AC

## 2016-07-30 NOTE — Progress Notes (Signed)
Pre visit review using our clinic review tool, if applicable. No additional management support is needed unless otherwise documented below in the visit note. 

## 2016-07-30 NOTE — Patient Instructions (Signed)
Please begin meloxicam once daily for low back pain. Call if new/worsening symptoms or if symptoms are not improved in 1 week.

## 2016-07-30 NOTE — Progress Notes (Signed)
 Subjective:    Patient ID: Edward [name], male    DOB: 05/15/1946, 70 y.o.   MRN: [id number]  HPI  Edward [name] 70 yr old male who presents today with c/o left low back pain.  He is accompanied by his sister (HCPOA) and his caregiver from his group home. Pain seems to radiate into the left buttock.  Caregiver notes that pt has been somewhat restless due to the discomfort. They deny any foul smelling urine. Pt denies dysuria. Caregiver is requesting rx for a shower chair.   Review of Systems See HPI  Past Medical History:  Diagnosis Date  . Anemia   . BPH (benign prostatic hyperplasia)   . Cataract   . Diabetes mellitus   . Hypertension    DENIES  . Mental retardation   . Schizophrenia (HCC)   . Thyroid disease   . Urinary incontinence      Social History   Social History  . Marital status: Single    Spouse name: N/A  . Number of children: N/A  . Years of education: N/A   Occupational History  . Not on file.   Social History Main Topics  . Smoking status: Never Smoker  . Smokeless tobacco: Current User    Types: Chew     Comment: chews tobacco  . Alcohol use No  . Drug use: No  . Sexual activity: Not on file   Other Topics Concern  . Not on file   Social History Narrative   Lives in a group home   Sister Sybill is his legal guardian.   Sister and her family live locally    Past Surgical History:  Procedure Laterality Date  . PROSTATE SURGERY     x2, ?procedure    Family History  Problem Relation Age of Onset  . Arthritis Mother   . Arrhythmia Mother   . Lymphoma Mother     non-hodgkins  . Kidney disease Mother   . Heart disease Father   . Stroke Father     No Known Allergies  Current Outpatient Prescriptions on File Prior to Visit  Medication Sig Dispense Refill  . aspirin 81 MG tablet Take 81 mg by mouth daily.    . Blood Glucose Monitoring Suppl (ACCU-CHEK AVIVA PLUS) W/DEVICE KIT DX E11.9 1 kit 0  . dutasteride (AVODART) 0.5 MG  capsule Take 1 capsule (0.5 mg total) by mouth daily. 30 capsule 5  . Emollient (CERAVE) CREA Apply 1 application topically. For dry skin on feet and legs    . ferrous sulfate 325 (65 FE) MG tablet Take 1 tablet (325 mg total) by mouth 2 (two) times daily with a meal. 60 tablet 5  . FLUoxetine (PROZAC) 10 MG capsule Take 1 capsule by mouth daily.    . glucose blood (ACCU-CHEK AVIVA) test strip Use as instructed to check blood sugar once a day.  DX  E11.9 100 each 1  . ketoconazole (NIZORAL) 2 % cream Apply 1 application topically 2 (two) times daily. 3 days a week, Monday, Wednesday and friday 15 g   . Lancet Devices (ACCU-CHEK SOFTCLIX) lancets Use as instructed  DX E11.9 10 each 1  . levothyroxine (SYNTHROID, LEVOTHROID) 100 MCG tablet Take 1 tablet (100 mcg total) by mouth daily. 30 tablet 5  . lip balm (BLISTEX) OINT Apply 1 application topically 4 (four) times daily as needed for lip care.    . Loperamide HCl (IMODIUM A-D PO) Take by mouth as needed.    .   Nutritional Supplements (NUTRITIONAL SHAKE PLUS) LIQD Drink 1 shake by mouth daily. 30 Bottle 5  . polyethylene glycol powder (GLYCOLAX/MIRALAX) powder 1 packet in 8 oz of water or juice once daily AS NEEDED for constipation 3350 g 1  . QUEtiapine (SEROQUEL) 50 MG tablet Take 50 mg by mouth 2 (two) times daily.    . Simethicone (MYLANTA GAS PO) Take by mouth as needed.    . tamsulosin (FLOMAX) 0.4 MG CAPS capsule Take 1 capsule (0.4 mg total) by mouth at bedtime. 30 capsule 2  . thioridazine (MELLARIL) 100 MG tablet Take 200 mg by mouth at bedtime.     . thioridazine (MELLARIL) 50 MG tablet Take 1 tablet (50 mg total) by mouth every morning. 30 tablet 10   No current facility-administered medications on file prior to visit.     BP 138/80 (BP Location: Right Arm, Cuff Size: Normal)   Pulse 86   Temp 97.9 F (36.6 C) (Oral)   Resp 16   Ht 5' 10" (1.778 m)   Wt 172 lb 9.6 oz (78.3 kg)   SpO2 100% Comment: room air  BMI 24.77 kg/m        Objective:   Physical Exam  Constitutional: He is oriented to person, place, and time. He appears well-developed and well-nourished. No distress.  HENT:  Head: Normocephalic and atraumatic.  Cardiovascular: Normal rate and regular rhythm.   No murmur heard. Pulmonary/Chest: Effort normal and breath sounds normal. No respiratory distress. He has no wheezes. He has no rales.  Abdominal: Soft. He exhibits no distension and no mass. There is no tenderness. There is no rebound and no guarding.  Musculoskeletal: He exhibits no edema.       Cervical back: He exhibits no tenderness.       Thoracic back: He exhibits no tenderness.       Lumbar back: He exhibits no tenderness.  Neurological: He is alert and oriented to person, place, and time.  Skin: Skin is warm and dry.  Psychiatric: He has a normal mood and affect. His behavior is normal. Thought content normal.          Assessment & Plan:  Musculoskeletal low back pain- pt pointes to the top of the left buttock as where the pain is the worst. Will give trial of short course of meloxicam. If pain worsens or fails to improve will plan additional imaging.   

## 2016-08-02 ENCOUNTER — Ambulatory Visit (HOSPITAL_BASED_OUTPATIENT_CLINIC_OR_DEPARTMENT_OTHER)
Admission: RE | Admit: 2016-08-02 | Discharge: 2016-08-02 | Disposition: A | Discharge status: Home / Self Care | Payer: Medicare Other | Source: Ambulatory Visit | Attending: Family | Admitting: Family

## 2016-08-02 ENCOUNTER — Other Ambulatory Visit: Payer: Self-pay | Admitting: Family

## 2016-08-02 ENCOUNTER — Telehealth: Payer: Self-pay | Admitting: Family

## 2016-08-02 ENCOUNTER — Encounter (HOSPITAL_BASED_OUTPATIENT_CLINIC_OR_DEPARTMENT_OTHER): Payer: Self-pay

## 2016-08-02 ENCOUNTER — Ambulatory Visit (HOSPITAL_BASED_OUTPATIENT_CLINIC_OR_DEPARTMENT_OTHER): Payer: Medicare Other

## 2016-08-02 DIAGNOSIS — M48061 Spinal stenosis, lumbar region without neurogenic claudication: Secondary | ICD-10-CM | POA: Insufficient documentation

## 2016-08-02 DIAGNOSIS — M545 Low back pain, unspecified: Secondary | ICD-10-CM

## 2016-08-02 DIAGNOSIS — M4697 Unspecified inflammatory spondylopathy, lumbosacral region: Secondary | ICD-10-CM | POA: Insufficient documentation

## 2016-08-02 DIAGNOSIS — K8020 Calculus of gallbladder without cholecystitis without obstruction: Secondary | ICD-10-CM | POA: Insufficient documentation

## 2016-08-02 DIAGNOSIS — M2578 Osteophyte, vertebrae: Secondary | ICD-10-CM | POA: Insufficient documentation

## 2016-08-02 DIAGNOSIS — M4696 Unspecified inflammatory spondylopathy, lumbar region: Secondary | ICD-10-CM | POA: Diagnosis not present

## 2016-08-02 DIAGNOSIS — R109 Unspecified abdominal pain: Secondary | ICD-10-CM

## 2016-08-02 DIAGNOSIS — N3289 Other specified disorders of bladder: Secondary | ICD-10-CM | POA: Diagnosis not present

## 2016-08-02 DIAGNOSIS — R10A0 Flank pain, unspecified side: Secondary | ICD-10-CM

## 2016-08-02 DIAGNOSIS — R195 Other fecal abnormalities: Secondary | ICD-10-CM | POA: Insufficient documentation

## 2016-08-02 DIAGNOSIS — N1330 Unspecified hydronephrosis: Secondary | ICD-10-CM | POA: Insufficient documentation

## 2016-08-02 NOTE — Telephone Encounter (Signed)
See order(s).

## 2016-08-02 NOTE — Telephone Encounter (Signed)
Let's have him complete CT scan to look for a kidney stone. I will also order an x ray of his lower back. Lets try to get this done today if possible.

## 2016-08-02 NOTE — Telephone Encounter (Signed)
Notified pt's caregiver. Was told by radiology that CT needs to be changed to Renal Stone Study. Rental stone Study placed and pt will be here by 2pm.

## 2016-08-02 NOTE — Telephone Encounter (Signed)
Delray Alt with [facility] called stating that the patient is not feeling any better since his appointment on 07/30/16. She states that the patient is actually feeling worse. She states that she was told at the appointment to call back for instruction if patient is not feeling better. Please advise.    Phone: [phone]

## 2016-08-02 NOTE — Telephone Encounter (Signed)
See additional phone note from PCP on 08/02/16.

## 2016-08-02 NOTE — Telephone Encounter (Signed)
Order sent to Imaging

## 2016-08-03 ENCOUNTER — Telehealth: Payer: Self-pay | Admitting: Family

## 2016-08-03 MED ORDER — HYDROCODONE-ACETAMINOPHEN 2.5-325 MG PO TABS: 1.0000 | ORAL_TABLET | Freq: Two times a day (BID) | ORAL | 0 refills | Status: AC | PRN

## 2016-08-03 MED ORDER — DOCUSATE SODIUM 100 MG PO CAPS: 100.0000 mg | ORAL_CAPSULE | Freq: Two times a day (BID) | ORAL | 5 refills | Status: AC

## 2016-08-03 MED ORDER — HYDROCODONE-ACETAMINOPHEN 2.5-325 MG PO TABS: 1.0000 | ORAL_TABLET | Freq: Two times a day (BID) | ORAL | 0 refills | Status: DC | PRN

## 2016-08-03 MED ORDER — DOCUSATE SODIUM 100 MG PO CAPS
100.0000 mg | ORAL_CAPSULE | Freq: Two times a day (BID) | ORAL | 5 refills | Status: DC
Start: 2016-08-03 — End: 2016-08-03

## 2016-08-03 NOTE — Telephone Encounter (Signed)
Notified Butch Penny and she voices understanding. Note faxed to her at [phone].

## 2016-08-03 NOTE — Telephone Encounter (Signed)
Reviewed findings of CT spine/abd/pelvis with [name], pt's HCPOA.  She is not interested in surgery for patient.  We discussed adding rx for pain. (would avoid nsaid due to risk of renal side effects and would avoid tramadol due to risk of interaction with prozac).  Will add low dose hydrocodone prn and a stool softener (standing) due to constipation.  We discussed risk/benefit of pain medications including increased risk of falls and she verbalizes understanding and wishes to proceed.   Please notify group home of medication changes above. See rx for pick up.

## 2016-08-03 NOTE — Telephone Encounter (Signed)
He may have more stools on the stool softener, but that would be good if we can keep him cleaned out. Currently his very backed up.  I would want to know if he develops diarrhea.

## 2016-08-03 NOTE — Telephone Encounter (Signed)
Notified Caregiver, Butch Penny per verbal from PCP that  CT shows constipation and degenerating discs. Will give hydrocodone as needed only for moderate to severe pain; not intended for daily use and to give colace 100mg  twice a day as constipation may worsen with pain medication. She states that pt currently has 2-5 bowel movements every day and passes large amounts of stool each time. States pt never actually uses the commode but wear depends. She prompts pt to use the commode multiple times a day but pt always states that he doesn't need to go but eventually goes in his depends. She is concerned that he is unable to tell that he needs to urinate or have bowel movements. She is concerned about increased stools with addition of daily stool softeners. Please advise?

## 2016-08-05 ENCOUNTER — Telehealth: Payer: Self-pay | Admitting: Family

## 2016-08-05 NOTE — Telephone Encounter (Signed)
[facility] called regarding the diagnosis for a shower chair. Please advise.   *they did not leave a phone number*

## 2016-08-06 NOTE — Telephone Encounter (Signed)
M[place] Disease please.

## 2016-08-06 NOTE — Telephone Encounter (Signed)
Edward [name]-- do we use back pain or is there another diagnosis?

## 2016-08-06 NOTE — Telephone Encounter (Signed)
[place], [phone] and was transferred to the [facility] office. Spoke with Diante and she states the group home manager provided them with the below diagnosis and no further information is needed at this time.

## 2016-08-17 DIAGNOSIS — N139 Obstructive and reflux uropathy, unspecified: Secondary | ICD-10-CM | POA: Diagnosis not present

## 2016-08-24 ENCOUNTER — Telehealth: Payer: Self-pay | Admitting: Family

## 2016-08-24 MED ORDER — NUTRITIONAL SHAKE PLUS PO LIQD: ORAL | 5 refills | Status: DC

## 2016-08-24 NOTE — Telephone Encounter (Signed)
Caller name Kludt,[name] Relation to pt: sister   Call back [place]: [place] [address], [place] [phone] (Phone) [phone] (Fax)    Reason for call:  Sister requesting a water pill Rx, please advise

## 2016-08-24 NOTE — Telephone Encounter (Signed)
Patient's guardian, Elizandro Macisaac, called stating that the patient's facility, [facility], needs very specific prescription documentation. That being said, the patient's prescription for his Nutritional Supplements (NUTRITIONAL SHAKE PLUS) LIQD States that the shake is chocolate. When [name] picked up new shakes for the patient they were vanilla instead. Golda Acre is requesting that Melissa change the prescription to say chocolate or vanilla shakes. She also stated that the patient will be out of his chocolate shakes tomorrow and the facility will not give him the vanilla shake without an updated prescription. Please advise   [name] Phone: [phone]

## 2016-08-24 NOTE — Telephone Encounter (Signed)
Spoke with sister. She requests that we update nutritional prescription for chocolate or vanilla shake since last shakes that she picked up at [facility] were vanilla and group home says they cannot give them since current order requests chocolate. Rx updated and faxed to [phone]. Scheduled pt for evaluation of swelling in both legs for tomorrow at 11am with PCP. Spoke with Butch Penny and advised her of updated Rx and appt for tomorrow.

## 2016-08-25 ENCOUNTER — Ambulatory Visit (INDEPENDENT_AMBULATORY_CARE_PROVIDER_SITE_OTHER): Payer: Medicare Other | Admitting: Family

## 2016-08-25 ENCOUNTER — Encounter: Payer: Self-pay | Admitting: Family

## 2016-08-25 VITALS — BP 137/63 | HR 84 | Temp 97.9°F | Wt 165.4 lb

## 2016-08-25 DIAGNOSIS — R609 Edema, unspecified: Secondary | ICD-10-CM | POA: Diagnosis not present

## 2016-08-25 LAB — BASIC METABOLIC PANEL
BUN: 39 mg/dL — ABNORMAL HIGH (ref 6–23)
CO2: 26 mEq/L (ref 19–32)
Calcium: 8.6 mg/dL (ref 8.4–10.5)
Chloride: 110 mEq/L (ref 96–112)
Creatinine, Ser: 2.27 mg/dL — ABNORMAL HIGH (ref 0.40–1.50)
GFR: 30.23 mL/min — AB (ref >60.00)
Glucose, Bld: 143 mg/dL — ABNORMAL HIGH (ref 70–99)
POTASSIUM: 3.6 meq/L (ref 3.5–5.1)
SODIUM: 142 meq/L (ref 135–145)

## 2016-08-25 MED ORDER — FUROSEMIDE 20 MG PO TABS: ORAL_TABLET | ORAL | 2 refills | Status: DC

## 2016-08-25 MED ORDER — NUTRITIONAL SHAKE PLUS PO LIQD: ORAL | 5 refills | Status: DC

## 2016-08-25 NOTE — Progress Notes (Signed)
Pre visit review using our clinic review tool, if applicable. No additional management support is needed unless otherwise documented below in the visit note. 

## 2016-08-25 NOTE — Patient Instructions (Signed)
Please complete lab work prior to leaving. Begin lasix 20mg  once daily in the AM as needed for swelling.

## 2016-08-25 NOTE — Progress Notes (Signed)
**Note Edward-Identified via Obfuscation** Subjective:    Patient ID: Edward [name], male    DOB: [birth year], 71 y.o.   MRN: [id number]  HPI  Edward [name] is a 71 yr old male who presents today with LE edema.    Caregiver notes LE edema in the evenings. Notes that the patient is uncomfortable due to the edema in the evenings.   She notes that that he had PVR of 1050 last week at urology.    Patient and caregiver deny SOB.    Wt Readings from Last 3 Encounters:  08/25/16 165 lb 6.4 oz (75 kg)  07/30/16 172 lb 9.6 oz (78.3 kg)  07/14/16 168 lb 12.8 oz (76.6 kg)    Review of Systems    see HPI  Past Medical History:  Diagnosis Date  . Anemia   . BPH (benign prostatic hyperplasia)   . Cataract   . Diabetes mellitus   . Hypertension    DENIES  . Mental retardation   . Schizophrenia ([place])   . Thyroid disease   . Urinary incontinence      Social History   Social History  . Marital status: Single    Spouse name: N/A  . Number of children: N/A  . Years of education: N/A   Occupational History  . Not on file.   Social History Main Topics  . Smoking status: Never Smoker  . Smokeless tobacco: Current User    Types: Chew     Comment: chews tobacco  . Alcohol use No  . Drug use: No  . Sexual activity: Not on file   Other Topics Concern  . Not on file   Social History Narrative   Lives in a group home   Sister [name] is his legal guardian.   Sister and her family live locally    Past Surgical History:  Procedure Laterality Date  . PROSTATE SURGERY     x2, ?procedure    Family History  Problem Relation Age of Onset  . Arthritis Mother   . Arrhythmia Mother   . Lymphoma Mother     non-hodgkins  . Kidney disease Mother   . Heart disease Father   . Stroke Father     No Known Allergies  Current Outpatient Prescriptions on File Prior to Visit  Medication Sig Dispense Refill  . aspirin 81 MG tablet Take 81 mg by mouth daily.    . Blood Glucose Monitoring Suppl (ACCU-CHEK AVIVA PLUS)  W/DEVICE KIT DX E11.9 1 kit 0  . docusate sodium (COLACE) 100 MG capsule Take 1 capsule (100 mg total) by mouth 2 (two) times daily. 60 capsule 5  . dutasteride (AVODART) 0.5 MG capsule Take 1 capsule (0.5 mg total) by mouth daily. 30 capsule 5  . Emollient (CERAVE) CREA Apply 1 application topically. For dry skin on feet and legs    . ferrous sulfate 325 (65 FE) MG tablet Take 1 tablet (325 mg total) by mouth 2 (two) times daily with a meal. 60 tablet 5  . FLUoxetine (PROZAC) 10 MG capsule Take 1 capsule by mouth daily.    Marland Kitchen glucose blood (ACCU-CHEK AVIVA) test strip Use as instructed to check blood sugar once a day.  DX  E11.9 100 each 1  . Hydrocodone-Acetaminophen 2.5-325 MG TABS Take 1 tablet by mouth 2 (two) times daily as needed. 45 tablet 0  . ketoconazole (NIZORAL) 2 % cream Apply 1 application topically 2 (two) times daily. 3 days a week, Monday, Wednesday and friday 15 g   .  Lancet Devices (ACCU-CHEK SOFTCLIX) lancets Use as instructed  DX E11.9 10 each 1  . levothyroxine (SYNTHROID, LEVOTHROID) 100 MCG tablet Take 1 tablet (100 mcg total) by mouth daily. 30 tablet 5  . lip balm (BLISTEX) OINT Apply 1 application topically 4 (four) times daily as needed for lip care.    . Loperamide HCl (IMODIUM A-D PO) Take by mouth as needed.    . meloxicam (MOBIC) 7.5 MG tablet Take 1 tablet (7.5 mg total) by mouth daily. 14 tablet 0  . polyethylene glycol powder (GLYCOLAX/MIRALAX) powder 1 packet in 8 oz of water or juice once daily AS NEEDED for constipation 3350 g 1  . QUEtiapine (SEROQUEL) 50 MG tablet Take 50 mg by mouth 2 (two) times daily.    . Simethicone (MYLANTA GAS PO) Take by mouth as needed.    . tamsulosin (FLOMAX) 0.4 MG CAPS capsule Take 1 capsule (0.4 mg total) by mouth at bedtime. 30 capsule 2  . thioridazine (MELLARIL) 100 MG tablet Take 200 mg by mouth at bedtime.     Marland Kitchen thioridazine (MELLARIL) 50 MG tablet Take 1 tablet (50 mg total) by mouth every morning. 30 tablet 10   No  current facility-administered medications on file prior to visit.     BP 137/63 (BP Location: Right Arm, Cuff Size: Normal)   Pulse 84   Temp 97.9 F (36.6 C) (Oral)   Wt 165 lb 6.4 oz (75 kg)   SpO2 99%   BMI 23.73 kg/m    Objective:   Physical Exam  Constitutional: He is oriented to person, place, and time. He appears well-developed and well-nourished. No distress.  HENT:  Head: Normocephalic and atraumatic.  Cardiovascular: Normal rate and regular rhythm.   No murmur heard. Pulmonary/Chest: Effort normal and breath sounds normal. No respiratory distress. He has no wheezes. He has no rales.  Musculoskeletal:  2+ bilateral LE edema  Neurological: He is alert and oriented to person, place, and time.  Skin: Skin is warm and dry.          Assessment & Plan:  Edema- new. no SOB- lungs are clear. Will add furosemide 8m daily PRN.  Will need close follow up to assess potassium.

## 2016-08-27 ENCOUNTER — Ambulatory Visit: Payer: Self-pay | Admitting: Cardiovascular Disease

## 2016-09-01 ENCOUNTER — Ambulatory Visit (INDEPENDENT_AMBULATORY_CARE_PROVIDER_SITE_OTHER): Payer: Medicare Other | Admitting: Family

## 2016-09-01 ENCOUNTER — Encounter: Payer: Self-pay | Admitting: Family

## 2016-09-01 ENCOUNTER — Telehealth: Payer: Self-pay | Admitting: Family

## 2016-09-01 VITALS — BP 136/86 | HR 98 | Temp 98.1°F | Ht 70.0 in | Wt 165.2 lb

## 2016-09-01 DIAGNOSIS — R609 Edema, unspecified: Secondary | ICD-10-CM | POA: Diagnosis not present

## 2016-09-01 LAB — BASIC METABOLIC PANEL
BUN: 45 mg/dL — AB (ref 6–23)
CALCIUM: 8.7 mg/dL (ref 8.4–10.5)
CO2: 23 mEq/L (ref 19–32)
CREATININE: 2.51 mg/dL — AB (ref 0.40–1.50)
Chloride: 110 mEq/L (ref 96–112)
GFR: 26.92 mL/min — AB (ref >60.00)
GLUCOSE: 196 mg/dL — AB (ref 70–99)
POTASSIUM: 3.6 meq/L (ref 3.5–5.1)
Sodium: 140 mEq/L (ref 135–145)

## 2016-09-01 MED ORDER — FUROSEMIDE 20 MG PO TABS: ORAL_TABLET | ORAL | 3 refills | Status: DC

## 2016-09-01 NOTE — Progress Notes (Signed)
Pre visit review using our clinic review tool, if applicable. No additional management support is needed unless otherwise documented below in the visit note. 

## 2016-09-01 NOTE — Telephone Encounter (Signed)
Pharmacy is calling with questions regarding patient's furosemide (LASIX) 20 MG tablet  Please advise  Pharmacy Phone: [phone]

## 2016-09-01 NOTE — Progress Notes (Signed)
Subjective:    Patient ID: Edward [name], male    DOB: 01/16/1946, 71 y.o.   MRN: [id number]  HPI  Edward [name] is a 71 yr old male who presents today for follow up of his lower extremity edema.He is accompanied by the caregiver from his group home. Last visit we added lasix 13m once daily.  Caregiver notes that they have given him 4 prn doses for his LE edema in the last 1 week. She reports that the patient is no longer complaining in the evenings that his legs hurt.  Wt Readings from Last 3 Encounters:  09/01/16 165 lb 3.2 oz (74.9 kg)  08/25/16 165 lb 6.4 oz (75 kg)  07/30/16 172 lb 9.6 oz (78.3 kg)   Review of Systems See HPI  Past Medical History:  Diagnosis Date  . Anemia   . BPH (benign prostatic hyperplasia)   . Cataract   . Diabetes mellitus   . Hypertension    DENIES  . Mental retardation   . Schizophrenia ([place]   . Thyroid disease   . Urinary incontinence      Social History   Social History  . Marital status: Single    Spouse name: N/A  . Number of children: N/A  . Years of education: N/A   Occupational History  . Not on file.   Social History Main Topics  . Smoking status: Never Smoker  . Smokeless tobacco: Current User    Types: Chew     Comment: chews tobacco  . Alcohol use No  . Drug use: No  . Sexual activity: Not on file   Other Topics Concern  . Not on file   Social History Narrative   Lives in a group home   Sister SKathaleen Buryis his legal guardian.   Sister and her family live locally    Past Surgical History:  Procedure Laterality Date  . PROSTATE SURGERY     x2, ?procedure    Family History  Problem Relation Age of Onset  . Arthritis Mother   . Arrhythmia Mother   . Lymphoma Mother     non-hodgkins  . Kidney disease Mother   . Heart disease Father   . Stroke Father     No Known Allergies  Current Outpatient Prescriptions on File Prior to Visit  Medication Sig Dispense Refill  . aspirin 81 MG tablet Take 81 mg by  mouth daily.    . Blood Glucose Monitoring Suppl (ACCU-CHEK AVIVA PLUS) W/DEVICE KIT DX E11.9 1 kit 0  . docusate sodium (COLACE) 100 MG capsule Take 1 capsule (100 mg total) by mouth 2 (two) times daily. 60 capsule 5  . dutasteride (AVODART) 0.5 MG capsule Take 1 capsule (0.5 mg total) by mouth daily. 30 capsule 5  . Emollient (CERAVE) CREA Apply 1 application topically. For dry skin on feet and legs    . ferrous sulfate 325 (65 FE) MG tablet Take 1 tablet (325 mg total) by mouth 2 (two) times daily with a meal. 60 tablet 5  . FLUoxetine (PROZAC) 10 MG capsule Take 1 capsule by mouth daily.    . furosemide (LASIX) 20 MG tablet 1 tablet by mouth once daily as needed for swelling. 30 tablet 2  . glucose blood (ACCU-CHEK AVIVA) test strip Use as instructed to check blood sugar once a day.  DX  E11.9 100 each 1  . Hydrocodone-Acetaminophen 2.5-325 MG TABS Take 1 tablet by mouth 2 (two) times daily as needed. 45 tablet  0  . ketoconazole (NIZORAL) 2 % cream Apply 1 application topically 2 (two) times daily. 3 days a week, Monday, Wednesday and friday 15 g   . Lancet Devices (ACCU-CHEK SOFTCLIX) lancets Use as instructed  DX E11.9 10 each 1  . levothyroxine (SYNTHROID, LEVOTHROID) 100 MCG tablet Take 1 tablet (100 mcg total) by mouth daily. 30 tablet 5  . lip balm (BLISTEX) OINT Apply 1 application topically 4 (four) times daily as needed for lip care.    . Loperamide HCl (IMODIUM A-D PO) Take by mouth as needed.    . meloxicam (MOBIC) 7.5 MG tablet Take 1 tablet (7.5 mg total) by mouth daily. 14 tablet 0  . Nutritional Supplements (NUTRITIONAL SHAKE PLUS) LIQD Drink 1 shake by mouth daily. 30 Bottle 5  . polyethylene glycol powder (GLYCOLAX/MIRALAX) powder 1 packet in 8 oz of water or juice once daily AS NEEDED for constipation 3350 g 1  . QUEtiapine (SEROQUEL) 50 MG tablet Take 50 mg by mouth 2 (two) times daily.    . Simethicone (MYLANTA GAS PO) Take by mouth as needed.    . tamsulosin (FLOMAX) 0.4  MG CAPS capsule Take 1 capsule (0.4 mg total) by mouth at bedtime. 30 capsule 2  . thioridazine (MELLARIL) 100 MG tablet Take 200 mg by mouth at bedtime.     Marland Kitchen thioridazine (MELLARIL) 50 MG tablet Take 1 tablet (50 mg total) by mouth every morning. 30 tablet 10   No current facility-administered medications on file prior to visit.     BP 136/86 (BP Location: Right Arm, Cuff Size: Normal)   Pulse 98   Temp 98.1 F (36.7 C) (Oral)   Ht 5' 10"  (1.778 m)   Wt 165 lb 3.2 oz (74.9 kg)   SpO2 100%   BMI 23.70 kg/m       Objective:   Physical Exam  Constitutional: He appears well-developed and well-nourished. No distress.  HENT:  Head: Normocephalic and atraumatic.  Cardiovascular: Normal rate and regular rhythm.   No murmur heard. Pulmonary/Chest: Effort normal and breath sounds normal. No respiratory distress. He has no wheezes. He has no rales.  Musculoskeletal:  2+ bilateral pedal edema.   Neurological: He is alert.  Skin: Skin is warm and dry.  Psychiatric: Judgment normal.          Assessment & Plan:  Edema- slight improvement with PRN lasix.  I think he would benefit from daily dosing.  Change to 35m PO daily. Repeat BMET is stable, very slighlty increase in creatinine since last visit.

## 2016-09-01 NOTE — Telephone Encounter (Signed)
Spoke with Maudie Mercury at [facility] and she wants to know if they are supposed to D/C pt's prn order of furosemide or keep it in addition to the once daily order? She states sometimes they will keep a prn order in case pt gains a certain amount of pounds, etc. . .  Please advise?

## 2016-09-01 NOTE — Patient Instructions (Signed)
Please complete lab work prior to leaving.   

## 2016-09-01 NOTE — Telephone Encounter (Signed)
D/c prn order. We will continue daily.

## 2016-09-02 ENCOUNTER — Encounter: Payer: Self-pay | Admitting: Family

## 2016-09-03 NOTE — Telephone Encounter (Signed)
Notified Kim at [facility] on 09/01/16.

## 2016-09-06 ENCOUNTER — Telehealth: Payer: Self-pay | Admitting: Family

## 2016-09-06 ENCOUNTER — Encounter: Payer: Self-pay | Admitting: Cardiovascular Disease

## 2016-09-06 ENCOUNTER — Ambulatory Visit (INDEPENDENT_AMBULATORY_CARE_PROVIDER_SITE_OTHER): Payer: Medicare Other | Admitting: Cardiovascular Disease

## 2016-09-06 ENCOUNTER — Ambulatory Visit (HOSPITAL_COMMUNITY)
Admission: RE | Admit: 2016-09-06 | Discharge: 2016-09-06 | Disposition: A | Discharge status: Home / Self Care | Payer: Medicare Other | Source: Ambulatory Visit | Attending: Cardiovascular Disease | Admitting: Cardiovascular Disease

## 2016-09-06 VITALS — BP 131/71 | HR 79 | Ht 70.0 in | Wt 165.0 lb

## 2016-09-06 DIAGNOSIS — N178 Other acute kidney failure: Secondary | ICD-10-CM | POA: Diagnosis not present

## 2016-09-06 DIAGNOSIS — R609 Edema, unspecified: Secondary | ICD-10-CM | POA: Insufficient documentation

## 2016-09-06 DIAGNOSIS — R9431 Abnormal electrocardiogram [ECG] [EKG]: Secondary | ICD-10-CM

## 2016-09-06 DIAGNOSIS — R0602 Shortness of breath: Secondary | ICD-10-CM

## 2016-09-06 LAB — COMPREHENSIVE METABOLIC PANEL
ALBUMIN: 3.2 g/dL — AB (ref 3.6–5.1)
ALT: 9 U/L (ref 9–46)
AST: 14 U/L (ref 10–35)
Alkaline Phosphatase: 85 U/L (ref 40–115)
BILIRUBIN TOTAL: 0.3 mg/dL (ref 0.2–1.2)
BUN: 42 mg/dL — AB (ref 7–25)
CHLORIDE: 109 mmol/L (ref 98–110)
CO2: 24 mmol/L (ref 20–31)
Calcium: 8.4 mg/dL — ABNORMAL LOW (ref 8.6–10.3)
Creat: 2.55 mg/dL — ABNORMAL HIGH (ref 0.70–1.18)
GLUCOSE: 91 mg/dL (ref 65–99)
Potassium: 4.1 mmol/L (ref 3.5–5.3)
Sodium: 144 mmol/L (ref 135–146)
Total Protein: 7.4 g/dL (ref 6.1–8.1)

## 2016-09-06 NOTE — Progress Notes (Signed)
Cardiology Office Note   Date:  09/06/2016   ID:  Edward [name] [birth year], MRN [id number]  PCP:  Nance Pear., NP  Cardiologist:   Skeet Latch, MD   Chief Complaint  Patient presents with  . Follow-up    1 year:       History of Present Illness: Edward [name] is a 71 y.o. male with hypertension, hyperlipidemia, orthostatic hypotension, schizophrenia, diabetes mellitus, prolonged QT and hypothyroidism, who presents for follow up.  Edward [name] lives in a group home.  He was initially sen 07/2015 after his EKG was noted to show a prolonged QTc of 474.  He was asymptomatic and his QT prolongation was thought to be due to antipsychotics.  He had been on a stable regimen since the 1970s and his QTc was relatively stable.  Therefore no changes were made to his regimen.  Edward [name] sister and caretaker note that he has been experiencing lower extremity edema for the last 6 weeks. He hasn't seemed short of breath and they haven't noted orthopnea.  He denies chest pain or pressure.  He saw his PCP who started him on lasix, which has helped.  His weight has gone down by 7 lb.  However, his creatinine has increased from 1.1 to 2.5.  He was referred back to cardiology for further evaluation.  Edward [name] has also been struggling with urinary retention and requires intermittent catheterizations.     Past Medical History:  Diagnosis Date  . Anemia   . BPH (benign prostatic hyperplasia)   . Cataract   . Diabetes mellitus   . Hypertension    DENIES  . Mental retardation   . Schizophrenia ([place])   . Thyroid disease   . Urinary incontinence     Past Surgical History:  Procedure Laterality Date  . PROSTATE SURGERY     x2, ?procedure     Current Outpatient Prescriptions  Medication Sig Dispense Refill  . aspirin 81 MG tablet Take 81 mg by mouth daily.    . Blood Glucose Monitoring Suppl (ACCU-CHEK AVIVA PLUS) W/DEVICE KIT DX E11.9 1 kit 0  . docusate sodium  (COLACE) 100 MG capsule Take 1 capsule (100 mg total) by mouth 2 (two) times daily. 60 capsule 5  . dutasteride (AVODART) 0.5 MG capsule Take 1 capsule (0.5 mg total) by mouth daily. 30 capsule 5  . Emollient (CERAVE) CREA Apply 1 application topically. For dry skin on feet and legs    . ferrous sulfate 325 (65 FE) MG tablet Take 1 tablet (325 mg total) by mouth 2 (two) times daily with a meal. 60 tablet 5  . FLUoxetine (PROZAC) 10 MG capsule Take 1 capsule by mouth daily.    . furosemide (LASIX) 20 MG tablet Take 20 mg by mouth daily.    Marland Kitchen glucose blood (ACCU-CHEK AVIVA) test strip Use as instructed to check blood sugar once a day.  DX  E11.9 100 each 1  . Hydrocodone-Acetaminophen 2.5-325 MG TABS Take 1 tablet by mouth 2 (two) times daily as needed. 45 tablet 0  . ketoconazole (NIZORAL) 2 % cream Apply 1 application topically 2 (two) times daily. 3 days a week, Monday, Wednesday and friday 15 g   . Lancet Devices (ACCU-CHEK SOFTCLIX) lancets Use as instructed  DX E11.9 10 each 1  . levothyroxine (SYNTHROID, LEVOTHROID) 100 MCG tablet Take 1 tablet (100 mcg total) by mouth daily. 30 tablet 5  . lip balm (BLISTEX) OINT Apply 1 application topically  4 (four) times daily as needed for lip care.    . Loperamide HCl (IMODIUM A-D PO) Take by mouth as needed.    . meloxicam (MOBIC) 7.5 MG tablet Take 1 tablet (7.5 mg total) by mouth daily. 14 tablet 0  . Nutritional Supplements (NUTRITIONAL SHAKE PLUS) LIQD Drink 1 shake by mouth daily. 30 Bottle 5  . polyethylene glycol powder (GLYCOLAX/MIRALAX) powder 1 packet in 8 oz of water or juice once daily AS NEEDED for constipation 3350 g 1  . QUEtiapine (SEROQUEL) 50 MG tablet Take 50 mg by mouth 2 (two) times daily.    . Simethicone (MYLANTA GAS PO) Take by mouth as needed.    . tamsulosin (FLOMAX) 0.4 MG CAPS capsule Take 1 capsule (0.4 mg total) by mouth at bedtime. 30 capsule 2  . thioridazine (MELLARIL) 100 MG tablet Take 200 mg by mouth at bedtime.       Marland Kitchen thioridazine (MELLARIL) 50 MG tablet Take 1 tablet (50 mg total) by mouth every morning. 30 tablet 10   No current facility-administered medications for this visit.     Allergies:   Patient has no known allergies.    Social History:  The patient  reports that he has never smoked. His smokeless tobacco use includes Chew. He reports that he does not drink alcohol or use drugs.   Family History:  The patient's family history includes Arrhythmia in his mother; Arthritis in his mother; Heart disease in his father; Kidney disease in his mother; Lymphoma in his mother; Stroke in his father.    ROS:  Please see the history of present illness.   Otherwise, review of systems are positive for none.   All other systems are reviewed and negative.    PHYSICAL EXAM: VS:  BP 131/71   Pulse 79   Ht 5' 10"  (1.778 m)   Wt 74.8 kg (165 lb)   BMI 23.68 kg/m  , BMI Body mass index is 23.68 kg/m. GENERAL:  Well appearing.  No acute distress.   HEENT:  Pupils equal round and reactive, fundi not visualized, oral mucosa unremarkable NECK:  No jugular venous distention, waveform within normal limits, carotid upstroke brisk and symmetric, no bruits, no thyromegaly LYMPHATICS:  No cervical adenopathy LUNGS:  Clear to auscultation bilaterally HEART:  RRR.  PMI not displaced or sustained,S1 and S2 within normal limits, no S3, no S4, no clicks, no rubs, no murmurs ABD:  Flat, positive bowel sounds normal in frequency in pitch, no bruits, no rebound, no guarding, no midline pulsatile mass, no hepatomegaly, no splenomegaly EXT:  2 plus pulses throughout, 2+ LE edema to lower tibia on L, ankle on R, no cyanosis no clubbing SKIN:  No rashes no nodules.  Excoriations on the right anterior tibia NEURO:  Cranial nerves II through XII grossly intact, motor grossly intact throughout PSYCH:  Cognitively intact, oriented to person place and time    EKG:  EKG is ordered today. The ekg ordered today demonstrates inus  rhythm. A beats per minute. First degree AV block. Poor R wave progression. Nonspecific ST changes. 09/06/16: Sinus rhythm rate 79 bpm.  Low voltage limb leads.  QTc 472   Recent Labs: 12/15/2015: ALT 13; TSH 2.83 02/20/2016: Hemoglobin 12.6; Platelets 273.0 09/01/2016: BUN 45; Creatinine, Ser 2.51; Potassium 3.6; Sodium 140    Lipid Panel    Component Value Date/Time   CHOL 116 08/20/2015 1142   TRIG 100.0 08/20/2015 1142   HDL 34.30 (L) 08/20/2015 1142   CHOLHDL  3 08/20/2015 1142   VLDL 20.0 08/20/2015 1142   LDLCALC 62 08/20/2015 1142      Wt Readings from Last 3 Encounters:  09/06/16 74.8 kg (165 lb)  09/01/16 74.9 kg (165 lb 3.2 oz)  08/25/16 75 kg (165 lb 6.4 oz)      ASSESSMENT AND PLAN:  # Lower extremity edema:  Mr. Lynam has bilateral lower extremity edema that is worse on the left than the right.  He doesn't have significant JVD or crackles on exam.  It isn't clear that his symptoms are due to heart failure.  We will get a BNP, lower extremity Dopplers and a CMP.  Recommend compression stockings.  Given the significant increase in his creatinine, we will reduce lasix to 10m daily.  Recommend compression stockings.    # Prolonged QTc: Stable  # Acute renal failure: Reduce lasix to 20 mg daily.  Repeat BMP at follow up.  Current medicines are reviewed at length with the patient today.  The patient does not have concerns regarding medicines.  The following changes have been made:  Reduce lasix to 20 mg daily  Labs/ tests ordered today include:   Orders Placed This Encounter  Procedures  . Compression stockings  . B Nat Peptide  . Comprehensive metabolic panel  . EKG 12-Lead  . ECHOCARDIOGRAM COMPLETE     Disposition:   FU with Mateya Torti C. ROval Linsey MD, [facility].   This note was written with the assistance of speech recognition software.  Please excuse any transcriptional errors.  Signed, Amani Marseille C. ROval Linsey MD, F[facility] Center 09/06/2016 9:59 PM    [facility]

## 2016-09-06 NOTE — Telephone Encounter (Signed)
Edward [name]-- was this for his musculoskeletal back pain on 07/30/16 office visit?

## 2016-09-06 NOTE — Telephone Encounter (Signed)
Caller name: Janett Billow Relationship to patient: [place] Can be reached: [phone] Pharmacy:  Reason for call: Need a dx for the tub seat order that was sent to [facility]

## 2016-09-06 NOTE — Patient Instructions (Addendum)
Medication Instructions:  DECREASE YOUR FUROSEMIDE TO 20 MG ONCE A DAY   Labwork: CMET/BNP AT [facility] ON THE FIRST FLOOR  Testing/Procedures: Your physician has requested that you have an echocardiogram. Echocardiography is a painless test that uses sound waves to create images of your heart. It provides your doctor with information about the size and shape of your heart and how well your heart's chambers and valves are working. This procedure takes approximately one hour. There are no restrictions for this procedure. [place] [address]  Your physician has requested that you have a lower or upper extremity venous duplex. This test is an ultrasound of the veins in the legs or arms. It looks at venous blood flow that carries blood from the heart to the legs or arms. Allow one hour for a Lower Venous exam. Allow thirty minutes for an Upper Venous exam. There are no restrictions or special instructions.  Follow-Up: Your physician recommends that you schedule a follow-up appointment in: 2 WEEKS   Any Other Special Instructions Will Be Listed Below (If Applicable). DR Rowley IS RECOMMENDING SUPPORT STOCKINGS, MEDIUM STRENGTH    If you need a refill on your cardiac medications before your next appointment, please call your pharmacy.

## 2016-09-07 LAB — BRAIN NATRIURETIC PEPTIDE: Brain Natriuretic Peptide: 47 pg/mL (ref <100)

## 2016-09-07 NOTE — Telephone Encounter (Signed)
Called [facility] did not get an answer, could not leave a vm

## 2016-09-07 NOTE — Telephone Encounter (Signed)
We can use diagnosis:  Motor neuron disease please.

## 2016-09-08 NOTE — Telephone Encounter (Signed)
No answer.  [facility] gave me the direct number [phone] ext [phone]

## 2016-09-09 ENCOUNTER — Ambulatory Visit: Payer: Medicare Other | Admitting: Cardiovascular Disease

## 2016-09-10 NOTE — Telephone Encounter (Signed)
Spoke with Seth Bake at [facility]. She states Janett Billow is not available but she will send Dx  G12.20 to her they will call if any further information is needed.

## 2016-09-17 ENCOUNTER — Other Ambulatory Visit (INDEPENDENT_AMBULATORY_CARE_PROVIDER_SITE_OTHER): Payer: Medicare Other

## 2016-09-17 ENCOUNTER — Ambulatory Visit (INDEPENDENT_AMBULATORY_CARE_PROVIDER_SITE_OTHER): Payer: Medicare Other | Admitting: Family

## 2016-09-17 DIAGNOSIS — N289 Disorder of kidney and ureter, unspecified: Secondary | ICD-10-CM

## 2016-09-17 LAB — BASIC METABOLIC PANEL
BUN: 31 mg/dL — AB (ref 6–23)
CHLORIDE: 108 meq/L (ref 96–112)
CO2: 27 mEq/L (ref 19–32)
CREATININE: 2.1 mg/dL — AB (ref 0.40–1.50)
Calcium: 8.4 mg/dL (ref 8.4–10.5)
GFR: 33.06 mL/min — ABNORMAL LOW (ref >60.00)
Glucose, Bld: 183 mg/dL — ABNORMAL HIGH (ref 70–99)
Potassium: 3.5 mEq/L (ref 3.5–5.1)
Sodium: 143 mEq/L (ref 135–145)

## 2016-09-17 NOTE — Progress Notes (Signed)
Pt scheduled on PCP schedule for labs in error. Per 09/06/16 lab note from cardiologist Skeet Latch), pt is supposed to have basic metabolic panel to reassess worsening kidney function. Order entered.

## 2016-09-20 DIAGNOSIS — R339 Retention of urine, unspecified: Secondary | ICD-10-CM | POA: Diagnosis not present

## 2016-09-21 ENCOUNTER — Ambulatory Visit (HOSPITAL_COMMUNITY): Payer: Medicare Other | Attending: Cardiovascular Disease

## 2016-09-21 ENCOUNTER — Other Ambulatory Visit: Payer: Self-pay

## 2016-09-21 DIAGNOSIS — I361 Nonrheumatic tricuspid (valve) insufficiency: Secondary | ICD-10-CM | POA: Diagnosis not present

## 2016-09-21 DIAGNOSIS — R069 Unspecified abnormalities of breathing: Secondary | ICD-10-CM | POA: Diagnosis present

## 2016-09-21 DIAGNOSIS — R0602 Shortness of breath: Secondary | ICD-10-CM

## 2016-09-21 DIAGNOSIS — I501 Left ventricular failure: Secondary | ICD-10-CM | POA: Insufficient documentation

## 2016-09-21 DIAGNOSIS — R609 Edema, unspecified: Secondary | ICD-10-CM

## 2016-09-21 IMAGING — MR MR CERVICAL SPINE W/O CM
4 of 5 series · 29 of 48 positions shown · non-contrast
Comparison: Brain MR performed same date dictated separately.
05/31/2014 CT cervical spine.

CLINICAL DATA: 70-year-old male with new onset (6 weeks) of slurred
speech and left-sided decreased motility and weakness. Falling more
frequently. Gait abnormality. Initial encounter.

EXAM:
MRI CERVICAL SPINE WITHOUT CONTRAST
TECHNIQUE: Multiplanar, multisequence MR imaging of the cervical spine was
performed. No intravenous contrast was administered.

[Series 7: T1 · sagittal · 3.0mm · 0.66mm/px · 7 of 19 slices shown]
[im 1/19]
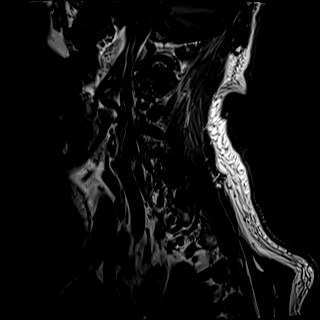
[im 4/19]
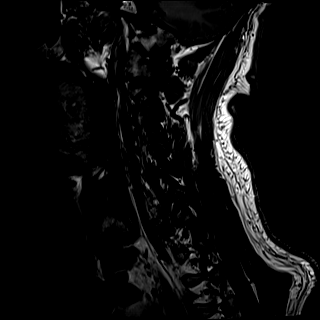
[im 7/19]
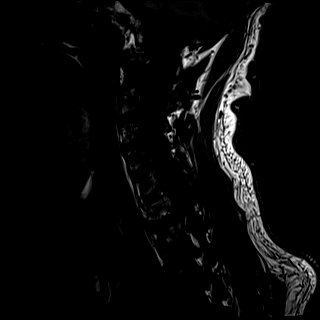
[im 10/19]
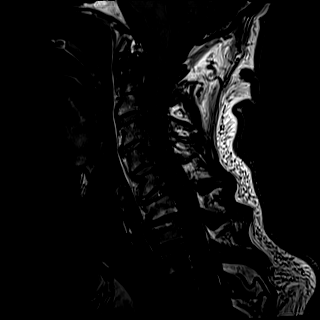
[im 13/19]
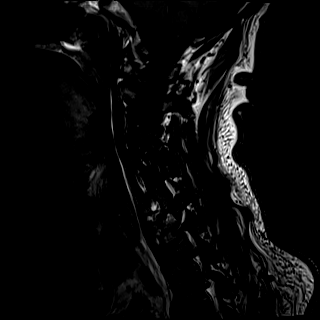
[im 16/19]
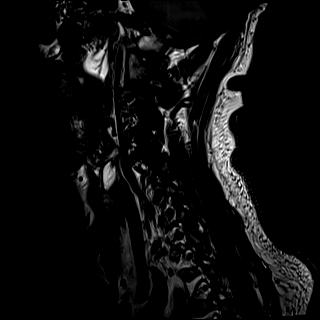
[im 19/19]
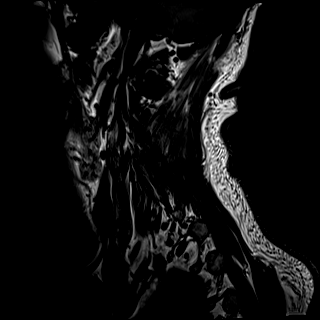

[Series 8: T2 · sagittal · 3.0mm · 0.55mm/px · 8 of 19 slices shown (1 of 2)]
[im 1/19]
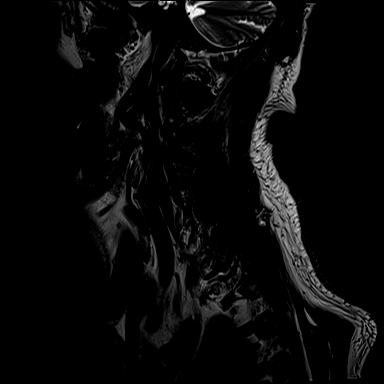
[im 3/19]
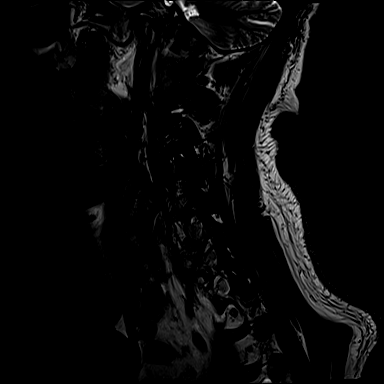
[im 6/19]
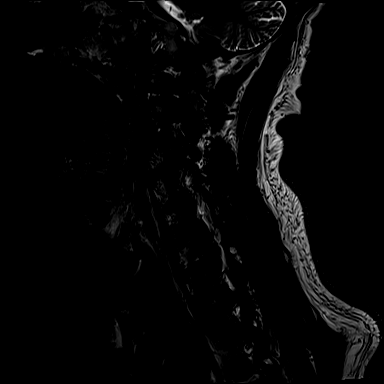
[im 8/19]
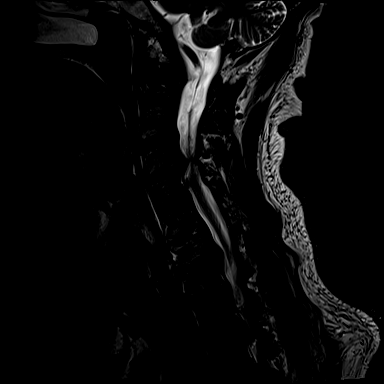
[im 11/19]
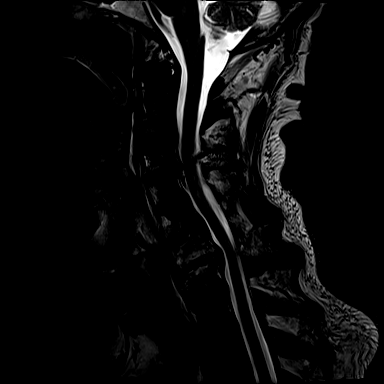
[im 13/19]
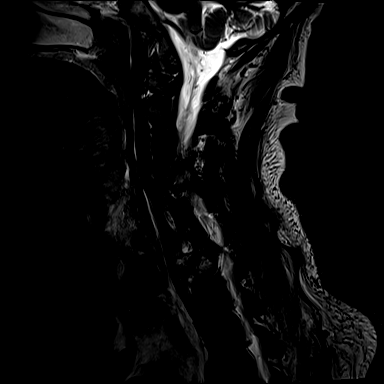
[im 16/19]
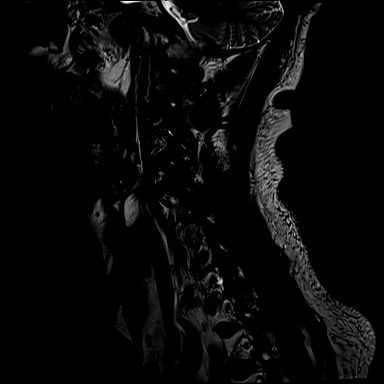
[im 19/19]
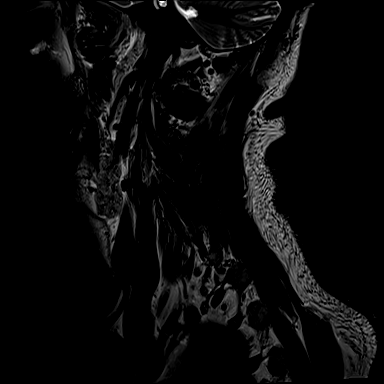

[Series 9: STIR · sagittal · 3.0mm · 0.33mm/px · 5 of 19 slices shown]
[im 1/19]
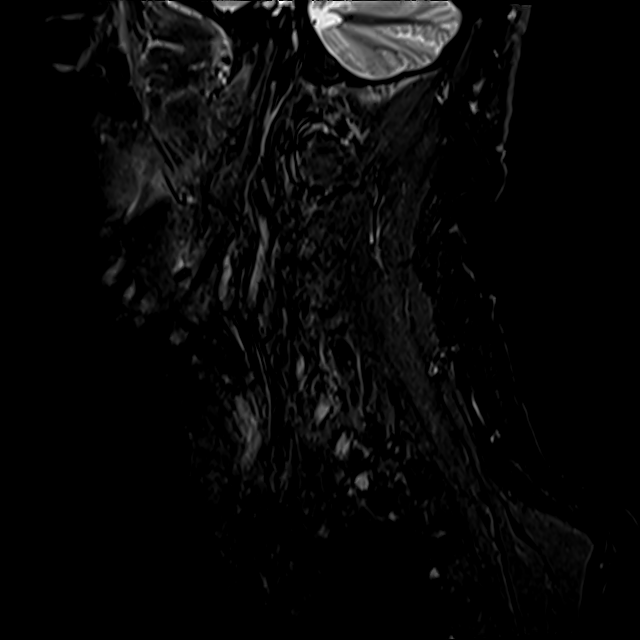
[im 3/19]
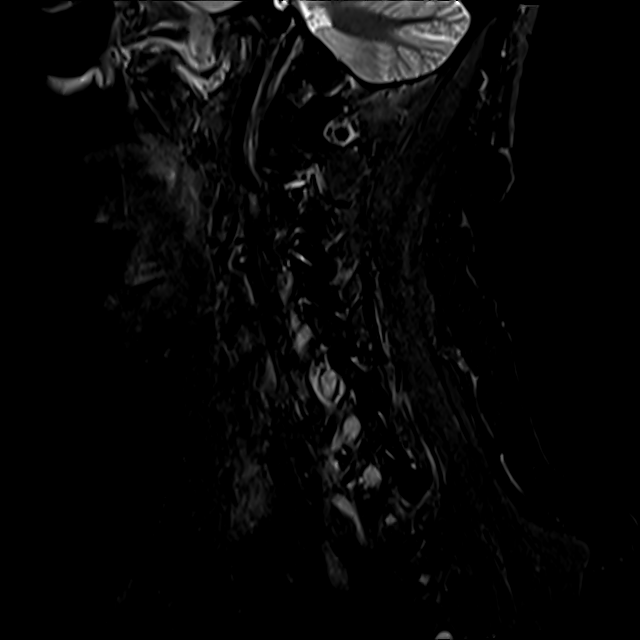
[im 6/19]
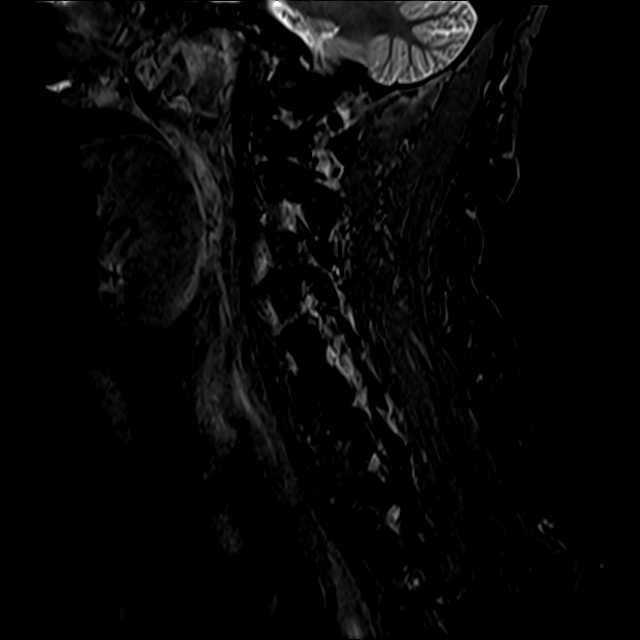
[im 11/19]
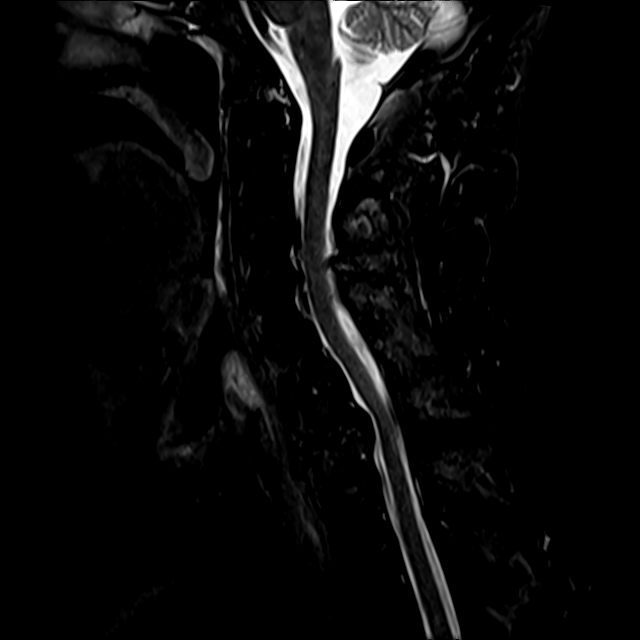
[im 16/19]
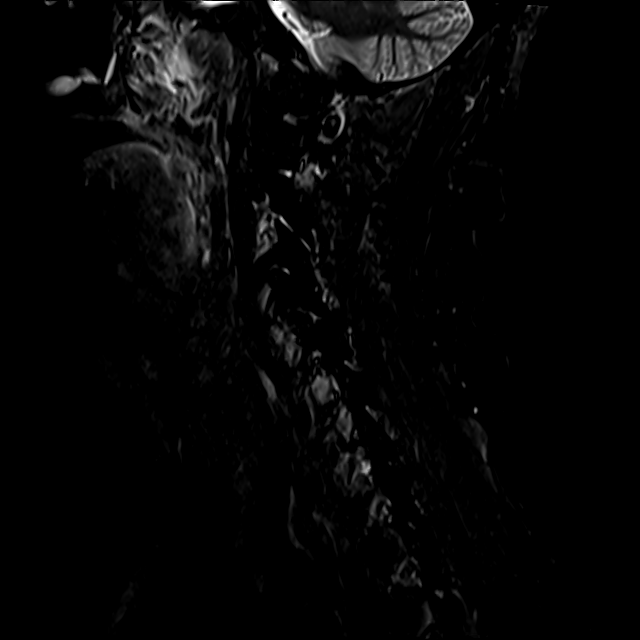

[Series 10: T2 · axial · 3.0mm · 0.50mm/px · z∈[-210,-108]mm · 9 of 33 slices shown (2 of 2)]
[im 1/33]
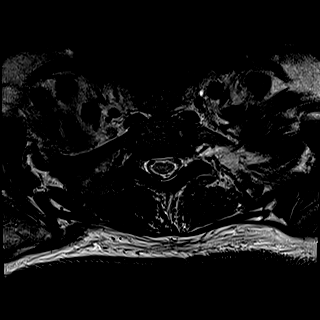
[im 6/33]
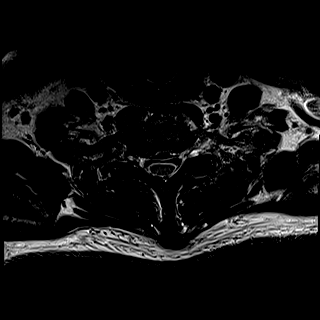
[im 11/33]
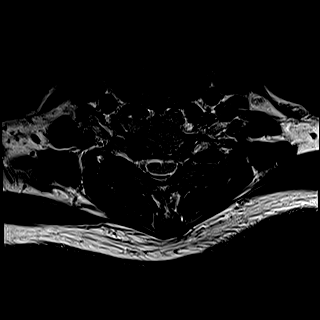
[im 14/33]
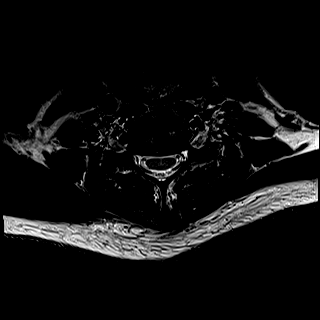
[im 17/33]
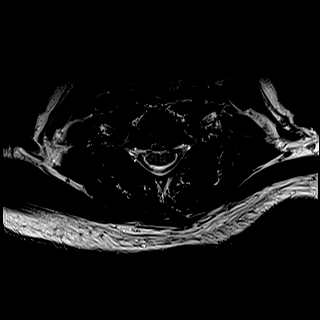
[im 19/33]
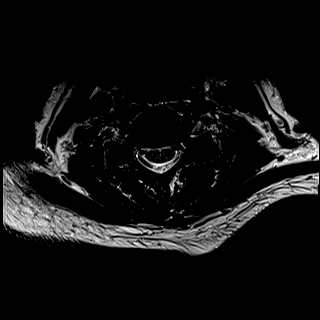
[im 22/33]
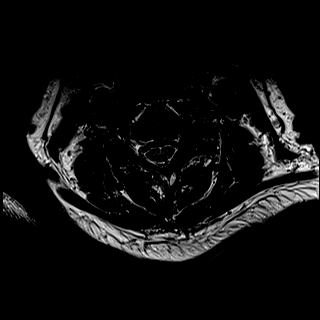
[im 27/33]
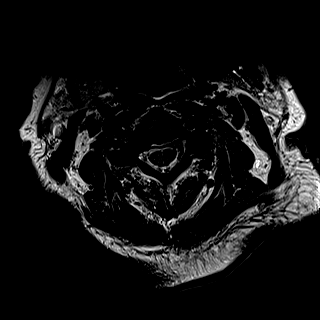
[im 33/33]
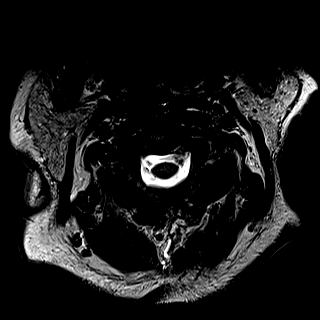

[29 of 48 positions shown; findings below may reference images not displayed]

FINDINGS: Alignment: Mild kyphosis C6-7 level.

Vertebrae: Degenerative changes without worrisome osseous
abnormality.

Cord: Mild motion artifact without focal cord signal abnormality.

Posterior Fossa, vertebral arteries, paraspinal tissues: Negative

Disc levels:

C1-2: Mild degenerative changes.

C2-3: Mild facet degenerative changes. Bulge. Slight narrowing
ventral thecal sac and minimal foraminal narrowing bilaterally.

C3-4: Broad-based disc osteophyte complex. Narrowing ventral thecal
sac with minimal cord flattening. Additionally, right posterior
lateral/ foraminal calcified protrusion/ uncinate hypertrophy with
marked right foraminal narrowing. Left uncinate hypertrophy with
mild to moderate left foraminal narrowing.

C4-5: Facet degenerative changes greater on the left. Mild left
foraminal narrowing. Minimal bulge.

C5-6:  Confluent anterior osteophyte.  Minimal bulge.

C6-7: Broad-based disc osteophyte complex with mild kyphosis
centered at this level. Mild cord flattening and posterior
displacement. Uncinate hypertrophy with moderate bilateral foraminal
narrowing.

C7-T1:  Minimal bulge right paracentral position.
IMPRESSION: Cervical spondylotic changes most notable C6-7 level and on the
right at the C3-4 level as detailed above.

## 2016-09-22 ENCOUNTER — Ambulatory Visit (INDEPENDENT_AMBULATORY_CARE_PROVIDER_SITE_OTHER): Payer: Medicare Other | Admitting: Podiatry

## 2016-09-22 ENCOUNTER — Encounter: Payer: Self-pay | Admitting: Cardiovascular Disease

## 2016-09-22 ENCOUNTER — Encounter: Payer: Self-pay | Admitting: Podiatry

## 2016-09-22 ENCOUNTER — Ambulatory Visit (INDEPENDENT_AMBULATORY_CARE_PROVIDER_SITE_OTHER): Payer: Medicare Other | Admitting: Cardiovascular Disease

## 2016-09-22 VITALS — BP 131/71 | HR 98 | Ht 70.0 in | Wt 166.4 lb

## 2016-09-22 VITALS — BP 128/65 | HR 97 | Resp 18

## 2016-09-22 DIAGNOSIS — B351 Tinea unguium: Secondary | ICD-10-CM | POA: Diagnosis not present

## 2016-09-22 DIAGNOSIS — E1140 Type 2 diabetes mellitus with diabetic neuropathy, unspecified: Secondary | ICD-10-CM | POA: Diagnosis not present

## 2016-09-22 DIAGNOSIS — R609 Edema, unspecified: Secondary | ICD-10-CM

## 2016-09-22 DIAGNOSIS — I739 Peripheral vascular disease, unspecified: Secondary | ICD-10-CM

## 2016-09-22 DIAGNOSIS — N178 Other acute kidney failure: Secondary | ICD-10-CM | POA: Diagnosis not present

## 2016-09-22 NOTE — Patient Instructions (Addendum)
Patient and patient's sister and representative present from assisted living facility today Request for debridement of toenails as patient is diabetic   Diabetes and Foot Care Diabetes may cause you to have problems because of poor blood supply (circulation) to your feet and legs. This may cause the skin on your feet to become thinner, break easier, and heal more slowly. Your skin may become dry, and the skin may peel and crack. You may also have nerve damage in your legs and feet causing decreased feeling in them. You may not notice minor injuries to your feet that could lead to infections or more serious problems. Taking care of your feet is one of the most important things you can do for yourself. Follow these instructions at home:  Wear shoes at all times, even in the house. Do not go barefoot. Bare feet are easily injured.  Check your feet daily for blisters, cuts, and redness. If you cannot see the bottom of your feet, use a mirror or ask someone for help.  Wash your feet with warm water (do not use hot water) and mild soap. Then pat your feet and the areas between your toes until they are completely dry. Do not soak your feet as this can dry your skin.  Apply a moisturizing lotion or petroleum jelly (that does not contain alcohol and is unscented) to the skin on your feet and to dry, brittle toenails. Do not apply lotion between your toes.  Trim your toenails straight across. Do not dig under them or around the cuticle. File the edges of your nails with an emery board or nail file.  Do not cut corns or calluses or try to remove them with medicine.  Wear clean socks or stockings every day. Make sure they are not too tight. Do not wear knee-high stockings since they may decrease blood flow to your legs.  Wear shoes that fit properly and have enough cushioning. To break in new shoes, wear them for just a few hours a day. This prevents you from injuring your feet. Always look in your shoes  before you put them on to be sure there are no objects inside.  Do not cross your legs. This may decrease the blood flow to your feet.  If you find a minor scrape, cut, or break in the skin on your feet, keep it and the skin around it clean and dry. These areas may be cleansed with mild soap and water. Do not cleanse the area with peroxide, alcohol, or iodine.  When you remove an adhesive bandage, be sure not to damage the skin around it.  If you have a wound, look at it several times a day to make sure it is healing.  Do not use heating pads or hot water bottles. They may burn your skin. If you have lost feeling in your feet or legs, you may not know it is happening until it is too late.  Make sure your health care provider performs a complete foot exam at least annually or more often if you have foot problems. Report any cuts, sores, or bruises to your health care provider immediately. Contact a health care provider if:  You have an injury that is not healing.  You have cuts or breaks in the skin.  You have an ingrown nail.  You notice redness on your legs or feet.  You feel burning or tingling in your legs or feet.  You have pain or cramps in your legs and  feet.  Your legs or feet are numb.  Your feet always feel cold. Get help right away if:  There is increasing redness, swelling, or pain in or around a wound.  There is a red line that goes up your leg.  Pus is coming from a wound.  You develop a fever or as directed by your health care provider.  You notice a bad smell coming from an ulcer or wound. This information is not intended to replace advice given to you by your health care provider. Make sure you discuss any questions you have with your health care provider. Document Released: 07/09/2000 Document Revised: 12/18/2015 Document Reviewed: 12/19/2012 Elsevier Interactive Patient Education  2017 [employer].

## 2016-09-22 NOTE — Patient Instructions (Signed)
Medication Instructions:  STOP LASIX (FUROSEMIDE)  Labwork: NONE  Testing/Procedures: NONE  Follow-Up: AS NEEDED

## 2016-09-22 NOTE — Progress Notes (Signed)
Cardiology Office Note   Date:  09/22/2016   ID:  Marshall, Kampf 1945/08/26, MRN [id number]  PCP:  Nance Pear., NP  Cardiologist:   Skeet Latch, MD   Chief Complaint  Patient presents with  . Follow-up    after echo      History of Present Illness: Edward [name] is a 71 y.o. male with hypertension, hyperlipidemia, orthostatic hypotension, schizophrenia, diabetes mellitus, prolonged QT and hypothyroidism, who presents for follow up.  Edward [name] lives in a group home.  He was initially sen 07/2015 after his EKG was noted to show a prolonged QTc of 474.  He was asymptomatic and his QT prolongation was thought to be due to antipsychotics.  He had been on a stable regimen since the 1970s and his QTc was relatively stable.  Therefore no changes were made to his regimen.  At his appointment 09/06/16 he reported lower extremity edema. However he did not appear to be in heart failure on exam. BNP was within normal limits. He had previously been started on Lasix which helped. However, his creatinine increased from 1.1-2.5. His Lasix dose was reduced with improvement in his creatinine to 2.1. Therefore Lasix was discontinued. It was recommended that he wear compression stockings.  Edward [name] has been feeling well And denies chest pain, shortness of breath or orthopnea. His swelling has improved significantly with the use of the compression stockings.   Past Medical History:  Diagnosis Date  . Anemia   . BPH (benign prostatic hyperplasia)   . Cataract   . Diabetes mellitus   . Hypertension    DENIES  . Mental retardation   . Schizophrenia ([place])   . Thyroid disease   . Urinary incontinence     Past Surgical History:  Procedure Laterality Date  . PROSTATE SURGERY     x2, ?procedure     Current Outpatient Prescriptions  Medication Sig Dispense Refill  . aspirin 81 MG tablet Take 81 mg by mouth daily.    . Blood Glucose Monitoring Suppl (ACCU-CHEK AVIVA PLUS)  W/DEVICE KIT DX E11.9 1 kit 0  . docusate sodium (COLACE) 100 MG capsule Take 1 capsule (100 mg total) by mouth 2 (two) times daily. 60 capsule 5  . dutasteride (AVODART) 0.5 MG capsule Take 1 capsule (0.5 mg total) by mouth daily. 30 capsule 5  . Emollient (CERAVE) CREA Apply 1 application topically. For dry skin on feet and legs    . ferrous sulfate 325 (65 FE) MG tablet Take 1 tablet (325 mg total) by mouth 2 (two) times daily with a meal. 60 tablet 5  . FLUoxetine (PROZAC) 10 MG capsule Take 1 capsule by mouth daily.    Marland Kitchen glucose blood (ACCU-CHEK AVIVA) test strip Use as instructed to check blood sugar once a day.  DX  E11.9 100 each 1  . Hydrocodone-Acetaminophen 2.5-325 MG TABS Take 1 tablet by mouth 2 (two) times daily as needed. 45 tablet 0  . ketoconazole (NIZORAL) 2 % cream Apply 1 application topically 2 (two) times daily. 3 days a week, Monday, Wednesday and friday 15 g   . Lancet Devices (ACCU-CHEK SOFTCLIX) lancets Use as instructed  DX E11.9 10 each 1  . levothyroxine (SYNTHROID, LEVOTHROID) 100 MCG tablet Take 1 tablet (100 mcg total) by mouth daily. 30 tablet 5  . lip balm (BLISTEX) OINT Apply 1 application topically 4 (four) times daily as needed for lip care.    . Loperamide HCl (IMODIUM A-D PO)  Take by mouth as needed.    . meloxicam (MOBIC) 7.5 MG tablet Take 1 tablet (7.5 mg total) by mouth daily. 14 tablet 0  . Nutritional Supplements (NUTRITIONAL SHAKE PLUS) LIQD Drink 1 shake by mouth daily. 30 Bottle 5  . polyethylene glycol powder (GLYCOLAX/MIRALAX) powder 1 packet in 8 oz of water or juice once daily AS NEEDED for constipation 3350 g 1  . QUEtiapine (SEROQUEL) 50 MG tablet Take 50 mg by mouth 2 (two) times daily.    . Simethicone (MYLANTA GAS PO) Take by mouth as needed.    . tamsulosin (FLOMAX) 0.4 MG CAPS capsule Take 1 capsule (0.4 mg total) by mouth at bedtime. 30 capsule 2  . thioridazine (MELLARIL) 100 MG tablet Take 200 mg by mouth at bedtime.     Marland Kitchen thioridazine  (MELLARIL) 50 MG tablet Take 1 tablet (50 mg total) by mouth every morning. 30 tablet 10   No current facility-administered medications for this visit.     Allergies:   Patient has no known allergies.    Social History:  The patient  reports that he has never smoked. His smokeless tobacco use includes Chew. He reports that he does not drink alcohol or use drugs.   Family History:  The patient's family history includes Arrhythmia in his mother; Arthritis in his mother; Heart disease in his father; Kidney disease in his mother; Lymphoma in his mother; Stroke in his father.    ROS:  Please see the history of present illness.   Otherwise, review of systems are positive for none.   All other systems are reviewed and negative.    PHYSICAL EXAM: VS:  BP 131/71   Pulse 98   Ht _0  (1.778 m)   Wt 75.5 kg (166 lb 6.4 oz)   BMI 23.88 kg/m  , BMI Body mass index is 23.88 kg/m. GENERAL:  Well appearing.  No acute distress.   HEENT:  Pupils equal round and reactive, fundi not visualized, oral mucosa unremarkable NECK:  No jugular venous distention, waveform within normal limits, carotid upstroke brisk and symmetric, no bruits, no thyromegaly LYMPHATICS:  No cervical adenopathy LUNGS:  Clear to auscultation bilaterally HEART:  RRR.  PMI not displaced or sustained,S1 and S2 within normal limits, no S3, no S4, no clicks, no rubs, no murmurs ABD:  Flat, positive bowel sounds normal in frequency in pitch, no bruits, no rebound, no guarding, no midline pulsatile mass, no hepatomegaly, no splenomegaly EXT:  2 plus pulses throughout, trace bilateral LE edema. No cyanosis no clubbing SKIN:  No rashes no nodules.  Excoriations on the right anterior tibia NEURO:  Cranial nerves II through XII grossly intact, motor grossly intact throughout PSYCH:  Cognitively intact, oriented to person place and time   EKG:  EKG is not ordered today. The ekg ordered today demonstrates inus rhythm. A beats per minute.  First degree AV block. Poor R wave progression. Nonspecific ST changes. 09/06/16: Sinus rhythm rate 79 bpm.  Low voltage limb leads.  QTc 472   Recent Labs: 12/15/2015: TSH 2.83 02/20/2016: Hemoglobin 12.6; Platelets 273.0 09/06/2016: ALT 9; Brain Natriuretic Peptide 47.0 09/17/2016: BUN 31; Creatinine, Ser 2.10; Potassium 3.5; Sodium 143    Lipid Panel    Component Value Date/Time   CHOL 116 08/20/2015 1142   TRIG 100.0 08/20/2015 1142   HDL 34.30 (L) 08/20/2015 1142   CHOLHDL 3 08/20/2015 1142   VLDL 20.0 08/20/2015 1142   LDLCALC 62 08/20/2015 1142  Wt Readings from Last 3 Encounters:  09/22/16 75.5 kg (166 lb 6.4 oz)  09/06/16 74.8 kg (165 lb)  09/01/16 74.9 kg (165 lb 3.2 oz)      ASSESSMENT AND PLAN:  # Lower extremity edema:  Mr. Manrique has bilateral lower extremity edema has improved with stockings. BNP was normal and his echo did not reveal any evidence of heart failure. I recommended that he stop taking Lasix. This was not received by his group home and he has continued to take it daily. We will ask him to stop it today and check a BMP in 2 weeks. His swelling was likely due to venous insufficiency. I also recommended that he limit his salt  # Acute renal failure: Improving.  Stop laisx. BMP in 2 weeks.   Current medicines are reviewed at length with the patient today.  The patient does not have concerns regarding medicines.  The following changes have been made:  Stop lasix.   Labs/ tests ordered today include:   No orders of the defined types were placed in this encounter.    Disposition:   FU with Greta Yung C. Oval Linsey, MD, [facility] as needed   This note was written with the assistance of speech recognition software.  Please excuse any transcriptional errors.  Signed, Chirsty Armistead C. Oval Linsey, MD, [facility]  09/22/2016 1:03 PM    [place]

## 2016-09-22 NOTE — Progress Notes (Signed)
   Subjective:    Patient ID: Edward [name], male    DOB: December 30, 1945, 71 y.o.   MRN: [id number]  HPI   This patient presents today with his sister was the power of attorney and the group home representative. The sister is requesting debridement of the toenails which he says they have become progressively more difficult to trim over the last 6-12 months. Also, because patient is diabetic assisted living will not trim the toenails. There is no recent history of podiatric care. Patient was last evaluated in our office on 10/08/2014  Patient has a history of schizophrenia  Review of Systems  All other systems reviewed and are negative.      Objective:   Physical Exam  Patient appears confused non-orientated  Vascular: Peripheral pitting edema bilaterally DP pulses 0/4 bilaterally PT pulses 0/4 bilaterally Capillary reflex delayed bilaterally  Neurological: Sedation to 10 g monofilament wire patient not able to respond Tuning fork patient not able to respond Ankle reflex reactive bilaterally  Dermatological: No open skin lesions bilaterally The toenails are brittle, discolored with subungual debris and deformity 6-10  Musculoskeletal: Patient has hyperpronated feet bilaterally Pes planus bilaterally This no restriction ankle, subtalar, midtarsal joints bilaterally      Assessment & Plan:   Assessment: Diabetic with history of neuropathy Diabetic with peripheral arterial disease Mycotic toenails 6-10  Plan: Debridement of toenails 6-10 mechanically analytic without any bleeding  Reappoint 3 months

## 2016-10-01 ENCOUNTER — Encounter: Payer: Self-pay | Admitting: Neurology

## 2016-10-01 ENCOUNTER — Ambulatory Visit (INDEPENDENT_AMBULATORY_CARE_PROVIDER_SITE_OTHER): Payer: Medicare Other | Admitting: Neurology

## 2016-10-01 ENCOUNTER — Ambulatory Visit (INDEPENDENT_AMBULATORY_CARE_PROVIDER_SITE_OTHER): Payer: Medicare Other | Admitting: Family

## 2016-10-01 VITALS — BP 125/67 | HR 86 | Temp 98.9°F | Ht 72.0 in | Wt 170.8 lb

## 2016-10-01 VITALS — BP 120/78 | HR 92 | Ht 70.0 in | Wt 167.5 lb

## 2016-10-01 DIAGNOSIS — R4189 Other symptoms and signs involving cognitive functions and awareness: Secondary | ICD-10-CM | POA: Diagnosis not present

## 2016-10-01 DIAGNOSIS — N289 Disorder of kidney and ureter, unspecified: Secondary | ICD-10-CM

## 2016-10-01 DIAGNOSIS — G1221 Amyotrophic lateral sclerosis: Secondary | ICD-10-CM

## 2016-10-01 DIAGNOSIS — R609 Edema, unspecified: Secondary | ICD-10-CM | POA: Diagnosis not present

## 2016-10-01 LAB — BASIC METABOLIC PANEL
BUN: 27 mg/dL — AB (ref 7–25)
CHLORIDE: 107 mmol/L (ref 98–110)
CO2: 25 mmol/L (ref 20–31)
Calcium: 7.9 mg/dL — ABNORMAL LOW (ref 8.6–10.3)
Creat: 1.82 mg/dL — ABNORMAL HIGH (ref 0.70–1.18)
GLUCOSE: 157 mg/dL — AB (ref 65–99)
POTASSIUM: 3.6 mmol/L (ref 3.5–5.3)
SODIUM: 141 mmol/L (ref 135–146)

## 2016-10-01 NOTE — Progress Notes (Signed)
Pre visit review using our clinic review tool, if applicable. No additional management support is needed unless otherwise documented below in the visit note. 

## 2016-10-01 NOTE — Progress Notes (Signed)
Subjective:    Patient ID: Edward [name], male    DOB: [birth year], 71 y.o.   MRN: [id number]  HPI  Edward [name] is a 71 yr old male who presents today for follow up of his lower extremity edema. Since his last visit his lasix was d/c'd by his cardiologist and he instead has been wearing compression stockings. His sister notes that he is doing well with the compression stockings.  Renal insufficiency- continues to have bladder outlet obstruction which is affecting his renal function.   HCPOA does not wish to do any interventions for this and his facility is unable to catheterize him due to not being a skilled facility.   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  He is still ambulatory at this point.    Lab Results  Component Value Date   CREATININE 2.10 (H) 09/17/2016   Wt Readings from Last 3 Encounters:  10/01/16 170 lb 12.8 oz (77.5 kg)  10/01/16 167 lb 8 oz (76 kg)  09/22/16 166 lb 6.4 oz (75.5 kg)     Review of Systems See HPI  Past Medical History:  Diagnosis Date  . 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 Anemia   . BPH (benign prostatic hyperplasia)   . Cataract   . Diabetes mellitus   . Hypertension    DENIES  . Mental retardation   . Schizophrenia ([place])   . Thyroid disease   . Urinary incontinence      Social History   Social History  . Marital status: Single    Spouse name: N/A  . Number of children: N/A  . Years of education: N/A   Occupational History  . Not on file.   Social History Main Topics  . Smoking status: Never Smoker  . Smokeless tobacco: Current User    Types: Chew     Comment: chews tobacco  . Alcohol use No  . Drug use: No  . Sexual activity: Not on file   Other Topics Concern  . Not on file   Social History Narrative   Lives in a group home   Sister Edward [name] is his legal guardian.   Sister and her family live locally    Past Surgical History:  Procedure Laterality Date  . PROSTATE  SURGERY     x2, ?procedure    Family History  Problem Relation Age of Onset  . Arthritis Mother   . Arrhythmia Mother   . Lymphoma Mother     non-hodgkins  . Kidney disease Mother   . Heart disease Father   . Stroke Father     No Known Allergies  Current Outpatient Prescriptions on File Prior to Visit  Medication Sig Dispense Refill  . aspirin 81 MG tablet Take 81 mg by mouth daily.    . Blood Glucose Monitoring Suppl (ACCU-CHEK AVIVA PLUS) W/DEVICE KIT DX E11.9 1 kit 0  . docusate sodium (COLACE) 100 MG capsule Take 1 capsule (100 mg total) by mouth 2 (two) times daily. 60 capsule 5  . dutasteride (AVODART) 0.5 MG capsule Take 1 capsule (0.5 mg total) by mouth daily. 30 capsule 5  . Emollient (CERAVE) CREA Apply 1 application topically. For dry skin on feet and legs    . ferrous sulfate 325 (65 FE) MG tablet Take 1 tablet (325 mg total) by mouth 2 (two) times daily with a meal. 60 tablet 5  . FLUoxetine (PROZAC) 10 MG capsule Take 1 capsule by mouth  daily.    . glucose blood (ACCU-CHEK AVIVA) test strip Use as instructed to check blood sugar once a day.  DX  E11.9 100 each 1  . Hydrocodone-Acetaminophen 2.5-325 MG TABS Take 1 tablet by mouth 2 (two) times daily as needed. 45 tablet 0  . ketoconazole (NIZORAL) 2 % cream Apply 1 application topically 2 (two) times daily. 3 days a week, Monday, Wednesday and friday 15 g   . Lancet Devices (ACCU-CHEK SOFTCLIX) lancets Use as instructed  DX E11.9 10 each 1  . levothyroxine (SYNTHROID, LEVOTHROID) 100 MCG tablet Take 1 tablet (100 mcg total) by mouth daily. 30 tablet 5  . lip balm (BLISTEX) OINT Apply 1 application topically 4 (four) times daily as needed for lip care.    . Loperamide HCl (IMODIUM A-D PO) Take by mouth as needed.    . meloxicam (MOBIC) 7.5 MG tablet Take 1 tablet (7.5 mg total) by mouth daily. 14 tablet 0  . Nutritional Supplements (NUTRITIONAL SHAKE PLUS) LIQD Drink 1 shake by mouth daily. 30 Bottle 5  . polyethylene  glycol powder (GLYCOLAX/MIRALAX) powder 1 packet in 8 oz of water or juice once daily AS NEEDED for constipation 3350 g 1  . QUEtiapine (SEROQUEL) 50 MG tablet Take 50 mg by mouth 2 (two) times daily.    . Simethicone (MYLANTA GAS PO) Take by mouth as needed.    . tamsulosin (FLOMAX) 0.4 MG CAPS capsule Take 1 capsule (0.4 mg total) by mouth at bedtime. 30 capsule 2  . thioridazine (MELLARIL) 100 MG tablet Take 200 mg by mouth at bedtime.     Marland Kitchen thioridazine (MELLARIL) 50 MG tablet Take 1 tablet (50 mg total) by mouth every morning. 30 tablet 10   No current facility-administered medications on file prior to visit.     BP 125/67 (BP Location: Right Arm, Patient Position: Sitting, Cuff Size: Large)   Pulse 86   Temp 98.9 F (37.2 C) (Oral)   Ht 6' (1.829 m)   Wt 170 lb 12.8 oz (77.5 kg)   SpO2 96%   BMI 23.16 kg/m       Objective:   Physical Exam  Constitutional: He is oriented to person, place, and time. He appears well-developed and well-nourished. No distress.  HENT:  Head: Normocephalic and atraumatic.  Cardiovascular: Normal rate and regular rhythm.   No murmur heard. Pulmonary/Chest: Effort normal and breath sounds normal. No respiratory distress. He has no wheezes. He has no rales.  Musculoskeletal:  1+ bilateral LE edema  Neurological: He is alert and oriented to person, place, and time.  Left hand contacture  Skin: Skin is warm and dry.  Psychiatric: He has a normal mood and affect. His behavior is normal. Thought content normal.          Assessment & Plan:  Renal insufficiency- obtain follow up bmet.  Edema- stable off of furosemide with compression stockings, continue same.    ALS- management per neuro. Sister is looking into trying to get him moved to a facility that can provide a higher level of care.

## 2016-10-01 NOTE — Patient Instructions (Signed)
Return to clinic in 6 months.

## 2016-10-01 NOTE — Progress Notes (Signed)
Follow-up Visit   Date: 10/01/16    Edward [name] MRN: [id number] DOB: 1945-12-08   Interim History: Edward [name] is a 71 y.o. male with congenital cognitive impairment, diabetes, schizophrenia, hypertension, hypothyroidism, and BPH returning for evaluation of motor neuron disease. The patient was accompanied to the clinic by sister and group home personnel who provides nearly all of his history due to the patient's severity of cognitive impairment.    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  He has long history of incontinence as well as choking on food, because of not chewing.  He currently resides at a group home where he receives care for bath, dressing, and gait.  He is able to feed himself, but is always under supervision, because of being a high choke risk.    Starting in February 2017, he began having generalized weakness of the arms and legs. His sister noticed that his hands started having muscle wasting and he was having greater difficulty holding objects.  Since September, he has severe weakness of the left hand where he is unable to extend the fingers.  He has been walking with a walker for the past 4 years, however, for the past three months, he has started to require a gait belt.  He has been falling more than usual, and has been hospitalized twice in the past few months.    He saw Dr. Carles Collet for new left hand and face tremor.  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  MRI cervical spine does not show compressive myelopathy to explain the severity of his symptoms.  Patient was referred to see me for further evaluation.  Patient denies shortness of breath, cramps, or pain.    UPDATE 10/01/2016:  Because of the severity of his cognitive impairment, electrodiagnostic testing could  not be performed.  At the last visit, we discussed the likely diagnosis of motor neuron disease.  Since his last visit, there has been a gradual worsening of L > R muscle atrophy and weakness.  He is still able to feed himself, but drops food more frequently.  His legs are weaker and now uses a shower chair to bath.  He is still able to walk with a walker.  The major issue is that at his group home, his medical needs are greater than what they can provide, so they are exploring other options with nursing care available.  No issues with shortness of breath or worsening choking spells.     Medications:  Current Outpatient Prescriptions on File Prior to Visit  Medication Sig Dispense Refill  . aspirin 81 MG tablet Take 81 mg by mouth daily.    . Blood Glucose Monitoring Suppl (ACCU-CHEK AVIVA PLUS) W/DEVICE KIT DX E11.9 1 kit 0  . docusate sodium (COLACE) 100 MG capsule Take 1 capsule (100 mg total) by mouth 2 (two) times daily. 60 capsule 5  . dutasteride (AVODART) 0.5 MG capsule Take 1 capsule (0.5 mg total) by mouth daily. 30 capsule 5  . Emollient (CERAVE) CREA Apply 1 application topically. For dry skin on feet and legs    . ferrous sulfate 325 (65 FE) MG tablet Take 1 tablet (325 mg total) by mouth 2 (two) times daily with a meal. 60 tablet 5  . FLUoxetine (PROZAC) 10 MG capsule Take 1 capsule by  mouth daily.    Marland Kitchen glucose blood (ACCU-CHEK AVIVA) test strip Use as instructed to check blood sugar once a day.  DX  E11.9 100 each 1  . Hydrocodone-Acetaminophen 2.5-325 MG TABS Take 1 tablet by mouth 2 (two) times daily as needed. 45 tablet 0  . ketoconazole (NIZORAL) 2 % cream Apply 1 application topically 2 (two) times daily. 3 days a week, Monday, Wednesday and friday 15 g   . Lancet Devices (ACCU-CHEK SOFTCLIX) lancets Use as instructed  DX E11.9 10 each 1  . levothyroxine (SYNTHROID, LEVOTHROID) 100 MCG tablet Take 1 tablet (100 mcg total) by mouth daily. 30 tablet 5  . lip balm (BLISTEX) OINT  Apply 1 application topically 4 (four) times daily as needed for lip care.    . Loperamide HCl (IMODIUM A-D PO) Take by mouth as needed.    . meloxicam (MOBIC) 7.5 MG tablet Take 1 tablet (7.5 mg total) by mouth daily. 14 tablet 0  . Nutritional Supplements (NUTRITIONAL SHAKE PLUS) LIQD Drink 1 shake by mouth daily. 30 Bottle 5  . polyethylene glycol powder (GLYCOLAX/MIRALAX) powder 1 packet in 8 oz of water or juice once daily AS NEEDED for constipation 3350 g 1  . QUEtiapine (SEROQUEL) 50 MG tablet Take 50 mg by mouth 2 (two) times daily.    . Simethicone (MYLANTA GAS PO) Take by mouth as needed.    . tamsulosin (FLOMAX) 0.4 MG CAPS capsule Take 1 capsule (0.4 mg total) by mouth at bedtime. 30 capsule 2  . thioridazine (MELLARIL) 100 MG tablet Take 200 mg by mouth at bedtime.     Marland Kitchen thioridazine (MELLARIL) 50 MG tablet Take 1 tablet (50 mg total) by mouth every morning. 30 tablet 10   No current facility-administered medications on file prior to visit.     Allergies: No Known Allergies  Review of Systems:  CONSTITUTIONAL: No fevers, chills, night sweats, or weight loss.  EYES: No visual changes or eye pain ENT: No hearing changes.  No history of nose bleeds.   RESPIRATORY: No cough, wheezing and shortness of breath.   CARDIOVASCULAR: Negative for chest pain, and palpitations.   GI: Negative for abdominal discomfort, blood in stools or black stools.  No recent change in bowel habits.   GU:  +history of incontinence.   MUSCLOSKELETAL: No history of joint pain or swelling.  No myalgias.   SKIN: Negative for lesions, rash, and itching.   ENDOCRINE: Negative for cold or heat intolerance, polydipsia or goiter.   PSYCH:  + depression or anxiety symptoms.   NEURO: As Above.   Vital Signs:  BP 120/78   Pulse 92   Ht 5' 10"  (1.778 m)   Wt 167 lb 8 oz (76 kg)   SpO2 98%   BMI 24.03 kg/m   Neurological Exam: MENTAL STATUS including orientation to time, place, person, recent and remote  memory, attention span and concentration, language, and fund of knowledge is extremely poor.  History is obtained through sister.  His speech is slurred and nonsensical.  He does not follow simple commands.    CRANIAL NERVES:  Pupils equal round and reactive to light.  Normal conjugate, extra-ocular eye movements in all directions of gaze.  No ptosis. Normal facial sensation.  Face is symmetric. Palate elevates symmetrically.  Tongue is midline, no fasciculations. Tongue strength is 5/5. Pathological facial reflexes are present.   MOTOR:  Because of his cognitive deficits, accurate assessment of his motor strength is difficult.  Neck flexion is  4/5.  He is 4+/5 proximally in the arms and legs.  Distal hand strength is very weak, 3+/5 on the right, 1/5 finger extension on the left.  ABP is 2/5 on the right, 0/5 on the left.  There is profound atrophy of the intrinsic hand muscles with evidence of split hand on the right.  Left forearm and left TA is also atrophied.  In the lower extremities, there is left foot drop with dorsiflexion 2/5.  Tone is normal throughout.  Fasciculations are present in the forearm and medial gastrocnemius muscles bilaterally.  Left hand tremor and head tremor is present.   MSRs:  Right                                                                 Left brachioradialis 2+  brachioradialis 2+  biceps 2+  biceps 2+  triceps 2+  triceps 2+  patellar 3+  patellar 3+  ankle jerk 2+  ankle jerk 2+  Hoffman no  Hoffman no  plantar response down  plantar response down   COORDINATION/GAIT:  Gait is not tested.  Data: MRI brain wo contrast 03/30/2016: No acute intracranial abnormality. Remote small left cerebellar infarct. Mild to moderate chronic microvascular changes. Mild global atrophy without hydrocephalus. Minimal to mild paranasal sinus mucosal thickening most notable involving the maxillary sinuses.  MRI cervical spine wo contrast 03/30/2016:   Cervical spondylotic  changes most notable C6-7 level and on the right at the C3-4 level as detailed above.  Lab Results  Component Value Date   TSH 2.83 12/15/2015   Labs 06/04/2016:  CK 34, aldolase 3.2, SPEP with IFE no M protein, vitamin B12 641  IMPRESSION/PLAN: Mr. 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   Motor strength testing is greatly hampered by patients cognition and inability to follow commands.  There is evidence of lower motor neuron dysfunction involving the bulbar, cervical and lumbosacral region with upper motor neuron involving the bulbar region.  His work-up has included MRI brain and cervical spine, which does not show intracranial etiology or compressive myelopathy.  With his history and exam, most likely has amyotrophic lateral sclerosis.  We discussed medications used in the management of ALS including riluzole and radicava, both were declined.  I recommended strategies to help with manipulating objects such as silverware to aide with feeding.  The major issue at this time is finding him a facility that can care for his cognitive and medical needs, and they are working with a case Freight forwarder for this.  As his disease progresses, he will need greater level of care.  I offered palliative care service, but this was vehemently declined by his sister.  Unfortunately, at this juncture, there is not much I can offer.  I will continue to follow him and address symptom management as his disease progresses.  Return to clinic in 6 months  The duration of this appointment visit was 30 minutes of face-to-face time with the patient.  Greater than 50% of this time was spent in counseling, explanation of diagnosis, planning of further management, and coordination of care.   Thank you for allowing me to participate in patient's care.  If I can answer any additional questions, I  would be pleased to do so.    Sincerely,    Donika K. Posey Pronto, DO

## 2016-10-03 ENCOUNTER — Telehealth: Payer: Self-pay | Admitting: Family

## 2016-10-03 MED ORDER — CALCIUM CARB-CHOLECALCIFEROL 600-800 MG-UNIT PO TABS: 1.0000 | ORAL_TABLET | Freq: Every day | ORAL | Status: DC

## 2016-10-03 NOTE — Telephone Encounter (Signed)
Kidney function has improved slightly. Calcium is a bit low.  I would like him to add caltrate 600mg  + vit D 400 iu once daily.

## 2016-10-04 MED ORDER — CALCIUM CARB-CHOLECALCIFEROL 600-800 MG-UNIT PO TABS: 1.0000 | ORAL_TABLET | Freq: Every day | ORAL | 0 refills | Status: DC

## 2016-10-04 NOTE — Addendum Note (Signed)
Addended by: Hinton Dyer on: 10/04/2016 09:30 AM   Modules accepted: Orders

## 2016-10-04 NOTE — Telephone Encounter (Addendum)
Tried to call pt's daughter lvm to call back. Spoke with Butch Penny at group home about pt results and that rx was sent to [place] voiced understanding.  Faxed phone note to Butch Penny at [phone]

## 2016-10-05 MED ORDER — NUTRITIONAL SHAKE PLUS PO LIQD: ORAL | 5 refills | Status: DC

## 2016-10-05 NOTE — Addendum Note (Signed)
Addended by: Kelle Darting A on: 10/05/2016 10:34 AM   Modules accepted: Orders

## 2016-10-05 NOTE — Telephone Encounter (Signed)
Notified pt's sister and she voices understanding. States she has a lot of trouble getting nutritional supplement through [employer] as she is not a member there. She is a member of [facility] in [place] where she lives and would like to try to get supplement through them. Reports that [facility] had a Joline Salt brand that pt really liked. Rx sent to [facility] in [place] with note requesting Joline Salt brand or similar substitute.

## 2016-10-06 ENCOUNTER — Ambulatory Visit: Payer: Self-pay | Admitting: Family

## 2016-10-12 MED ORDER — CALCIUM CARBONATE-VITAMIN D 600-400 MG-UNIT PO TABS: 1.0000 | ORAL_TABLET | Freq: Every day | ORAL | 5 refills | Status: AC

## 2016-10-12 NOTE — Telephone Encounter (Signed)
Spoke with Butch Penny. She states pharmacy did not receive calcium prescription and she did not receive a fax order. Confirmed below fax # below and re-sent Rx to [facility] and refaxed copy of phone note.

## 2016-10-12 NOTE — Addendum Note (Signed)
Addended by: Kelle Darting A on: 10/12/2016 01:00 PM   Modules accepted: Orders

## 2016-10-12 NOTE — Telephone Encounter (Signed)
House manger of Group home patient is staying Butch Penny,  they are needing the rx resend to [place] they did not receive the prescriptions. Please resend. [phone]

## 2016-10-13 DIAGNOSIS — R339 Retention of urine, unspecified: Secondary | ICD-10-CM | POA: Diagnosis not present

## 2016-10-18 DIAGNOSIS — R338 Other retention of urine: Secondary | ICD-10-CM | POA: Diagnosis not present

## 2016-10-18 DIAGNOSIS — N400 Enlarged prostate without lower urinary tract symptoms: Secondary | ICD-10-CM | POA: Diagnosis not present

## 2016-10-19 ENCOUNTER — Telehealth: Payer: Self-pay | Admitting: Family

## 2016-10-19 MED ORDER — NUTRITIONAL SUPPLEMENT PO LIQD: ORAL | 0 refills | Status: DC

## 2016-10-19 NOTE — Telephone Encounter (Signed)
[name] sister called in to request a call back Golda Acre  [phone]

## 2016-10-19 NOTE — Telephone Encounter (Signed)
Spoke with pt's sister:  1-- She was unable to get an equivalent nutritional shake of [employer]'s brand from [employer]. They feel [facility]'s Equate brand would be equivalent. Pt would like to pick up a written prescription for Equate Nutritional Shakes (specify vanilla or chocolate). Rx pended.  2--  Pt saw Dr Shirlean Mylar yesterday [facility]) and was told that he didn't feel monthly cath will benefit pt going forward and pt could stop it. Golda Acre is concerned because of what she has been told by other urologist in the past and she has requested that they continue monthly cath. Dr Shirlean Mylar agreed to let pt continue Cath but Golda Acre would like your opinion to make sure she is not pursuing something in vain. States pt was told in 2010 by [facility] that he should be cathed every 3 months after his TURP. Dr Edwinna Areola with [facility] previously recommended pt continue cath every 6 weeks to prevent infection. She understands that pt's kidney function will likely worsen and he is not a dialysis candidate. Please advise?

## 2016-10-19 NOTE — Telephone Encounter (Signed)
Notified pt's sister and she will pick up Rx on Thursday. Rx placed on ledge for PCP signature.

## 2016-10-19 NOTE — Telephone Encounter (Signed)
I don't think that it is harmful for him to be cathed, however it is unlikely to change the course of his worsening renal function unless it was to be done on a daily basis which is not reasonable for him.  He is at increased risk of infection always due to his retained urine.  I am not convinced that a monthly cath will dramatically change this risk.  I think that it is OK either way- up to her.  rx signed.

## 2016-10-19 NOTE — Telephone Encounter (Signed)
Rx signed and placed at front desk for pick up.

## 2016-10-25 ENCOUNTER — Telehealth: Payer: Self-pay | Admitting: Family

## 2016-10-25 NOTE — Telephone Encounter (Signed)
Caller name: Delray Alt Relation to pt: Residential Teaching laboratory technician  Call back number: [phone]  Reason for call: Butch Penny dropped of documents for PCP to have on file and also is needing some documents to be signed and dated since pt is needing documents ASAP. Documents given to [place]. Butch Penny would pick up documents as soon as it ready. (pt's helper was informed that PCP is not here for the wk) Butch Penny stated ok to have document signed by provider who is taken care of Melissa's pt. Please advise.

## 2016-10-26 NOTE — Telephone Encounter (Signed)
Spoke with Butch Penny at group home regarding FL2 for skilled nursing facility. She states that pt is needing a level of care that they are not equipped to handle regarding pts bladder / kidney conditions. States pt has constant urine leakage, belly feels hard all the time and pt complains of pain. States they are trying to do what is best for pt's health and safety. Advised her that PCP is out of the office this week and this will not be signed by covering PCP since this is a new transition. I asked Butch Penny if they had already notified pt's sister (legal guardian) and she repeated that they are trying to get pt the level of care he needs. Spoke with PCP via phone and advised of request. She recommends office visit with pt, sister and Butch Penny (group home) to discuss care goals for the pt and how best to achieve these goals. Scheduled appt for 11/05/16 at 2:15pm as sister states she will not be in this area until Wednesday of the following week. Edward [name] of appt date time and she voices understanding. Urology note and FL2 form placed in PCP's red folder awaiting appt.

## 2016-10-30 NOTE — Telephone Encounter (Signed)
Noted  

## 2016-11-04 ENCOUNTER — Telehealth: Payer: Self-pay | Admitting: Family

## 2016-11-04 NOTE — Telephone Encounter (Signed)
Caller name:Maines,[name] Relation to pt: sister  Call back number: [phone]    Reason for call:  Sister would like to speak with Gilmore Laroche prior to patient 11/05/16 appointment regarding patient care. Sister states the home would like to remove patient from a group home setting into a private facility, sister is against this suggestion and would like to know if a conference call can be conducted between her, lawyer and Lenna Sciara, please advise

## 2016-11-04 NOTE — Telephone Encounter (Signed)
Melissa and Gilmore Laroche will be out of the office until Friday, 11/05/16 [off on Thursday]/SLS 04/12

## 2016-11-05 ENCOUNTER — Encounter: Payer: Self-pay | Admitting: Family

## 2016-11-05 ENCOUNTER — Ambulatory Visit (INDEPENDENT_AMBULATORY_CARE_PROVIDER_SITE_OTHER): Payer: Medicare Other | Admitting: Family

## 2016-11-05 VITALS — BP 131/63 | HR 91 | Resp 18 | Ht 70.0 in | Wt 172.8 lb

## 2016-11-05 DIAGNOSIS — F79 Unspecified intellectual disabilities: Secondary | ICD-10-CM | POA: Diagnosis not present

## 2016-11-05 DIAGNOSIS — G1221 Amyotrophic lateral sclerosis: Secondary | ICD-10-CM | POA: Diagnosis not present

## 2016-11-05 NOTE — Telephone Encounter (Signed)
Spoke with Golda Acre and PCP. We will not conference call at this time. Will proceed with office visit today with pt, sister and group home. Advised [name] she can contact the [facility] and sign a release for records / information that lawyer is requesting. She voices understanding.

## 2016-11-05 NOTE — Progress Notes (Signed)
Pre visit review using our clinic review tool, if applicable. No additional management support is needed unless otherwise documented below in the visit note. 

## 2016-11-05 NOTE — Progress Notes (Signed)
Subjective:    Patient ID: Edward [name], male    DOB: May 10, 1946, 71 y.o.   MRN: [id number]  HPI  [name] is  71 yr old male with ALS, CRF in setting of bladder outlet obstruction.  He is currently residing a a group home which per his sister is a Level II group home.  He needs some assistance with bathing and is incontinent.  He is ambulatory with a walker. He is verbal.  He is able to feed himself if his meals are prepared.   His sister who is his legal guardian and his HCPOA presents today along with the Mudlogger and a supervisor from him Group home at [place]. Per the patient's sister, because he has needs that exceed his current living situation, he has funding from the state for an additional 1:1 staff person.  The group home employees state that this is only temporary until he can be placed elsewhere and they feel that none of the higher level facilities will take him because of his ALS diagnosis.    Review of Systems See HPI  Past Medical History:  Diagnosis Date  . ALS (amyotrophic lateral sclerosis) ([place]) 06/04/2016  . Anemia   . BPH (benign prostatic hyperplasia)   . Cataract   . Diabetes mellitus   . Hypertension    DENIES  . Mental retardation   . Schizophrenia ([place])   . Thyroid disease   . Urinary incontinence      Social History   Social History  . Marital status: Single    Spouse name: N/A  . Number of children: N/A  . Years of education: N/A   Occupational History  . Not on file.   Social History Main Topics  . Smoking status: Never Smoker  . Smokeless tobacco: Current User    Types: Chew     Comment: chews tobacco  . Alcohol use No  . Drug use: No  . Sexual activity: Not on file   Other Topics Concern  . Not on file   Social History Narrative   Lives in a group home   Sister Edward [name] is his legal guardian.   Sister and her family live locally    Past Surgical History:  Procedure Laterality Date  . PROSTATE SURGERY     x2,  ?procedure    Family History  Problem Relation Age of Onset  . Arthritis Mother   . Arrhythmia Mother   . Lymphoma Mother     non-hodgkins  . Kidney disease Mother   . Heart disease Father   . Stroke Father     No Known Allergies  Current Outpatient Prescriptions on File Prior to Visit  Medication Sig Dispense Refill  . aspirin 81 MG tablet Take 81 mg by mouth daily.    . Calcium Carbonate-Vitamin D (CALCIUM 600+D) 600-400 MG-UNIT tablet Take 1 tablet by mouth daily. 30 tablet 5  . docusate sodium (COLACE) 100 MG capsule Take 1 capsule (100 mg total) by mouth 2 (two) times daily. 60 capsule 5  . dutasteride (AVODART) 0.5 MG capsule Take 1 capsule (0.5 mg total) by mouth daily. 30 capsule 5  . Emollient (CERAVE) CREA Apply 1 application topically. For dry skin on feet and legs    . ferrous sulfate 325 (65 FE) MG tablet Take 1 tablet (325 mg total) by mouth 2 (two) times daily with a meal. 60 tablet 5  . FLUoxetine (PROZAC) 10 MG capsule Take 1 capsule by mouth daily.    Marland Kitchen  Hydrocodone-Acetaminophen 2.5-325 MG TABS Take 1 tablet by mouth 2 (two) times daily as needed. 45 tablet 0  . ketoconazole (NIZORAL) 2 % cream Apply 1 application topically 2 (two) times daily. 3 days a week, Monday, Wednesday and friday 15 g   . levothyroxine (SYNTHROID, LEVOTHROID) 100 MCG tablet Take 1 tablet (100 mcg total) by mouth daily. 30 tablet 5  . Loperamide HCl (IMODIUM A-D PO) Take by mouth as needed.    . NUTRITIONAL SUPPLEMENT LIQD EQUATE NUTRITIONAL SHAKE. (VANILLA OR CHOCOLATE).  Drink 1 shake daily. 30 Can 0  . QUEtiapine (SEROQUEL) 50 MG tablet Take 50 mg by mouth 2 (two) times daily.    . Simethicone (MYLANTA GAS PO) Take by mouth as needed.    . tamsulosin (FLOMAX) 0.4 MG CAPS capsule Take 1 capsule (0.4 mg total) by mouth at bedtime. 30 capsule 2  . thioridazine (MELLARIL) 100 MG tablet Take 200 mg by mouth at bedtime.     Marland Kitchen thioridazine (MELLARIL) 50 MG tablet Take 1 tablet (50 mg total) by  mouth every morning. 30 tablet 10  . Blood Glucose Monitoring Suppl (ACCU-CHEK AVIVA PLUS) W/DEVICE KIT DX E11.9 1 kit 0  . glucose blood (ACCU-CHEK AVIVA) test strip Use as instructed to check blood sugar once a day.  DX  E11.9 100 each 1  . Lancet Devices (ACCU-CHEK SOFTCLIX) lancets Use as instructed  DX E11.9 10 each 1  . lip balm (BLISTEX) OINT Apply 1 application topically 4 (four) times daily as needed for lip care.    . meloxicam (MOBIC) 7.5 MG tablet Take 1 tablet (7.5 mg total) by mouth daily. 14 tablet 0  . polyethylene glycol powder (GLYCOLAX/MIRALAX) powder 1 packet in 8 oz of water or juice once daily AS NEEDED for constipation 3350 g 1   No current facility-administered medications on file prior to visit.     BP 131/63 (BP Location: Right Arm, Cuff Size: Large)   Pulse 91   Resp 18   Ht 5' 10"  (1.778 m)   Wt 172 lb 12.8 oz (78.4 kg)   SpO2 100% Comment: room air  BMI 24.79 kg/m       Objective:   Physical Exam  Constitutional: He is oriented to person, place, and time. He appears well-developed and well-nourished. No distress.  HENT:  Head: Normocephalic and atraumatic.  Cardiovascular: Normal rate and regular rhythm.   No murmur heard. Pulmonary/Chest: Effort normal and breath sounds normal. No respiratory distress. He has no wheezes. He has no rales.  Abdominal: There is no tenderness.  Mild abdominal distension without tenderness or guarding.   Musculoskeletal: He exhibits no edema.  Neurological: He is alert and oriented to person, place, and time.  Skin: Skin is warm and dry.  Psychiatric: He has a normal mood and affect. His behavior is normal. Thought content normal.          Assessment & Plan:  Mental retardation/ALS- very difficult situation.  I do not feel that he has any skilled nursing needs at this time- though this will likely change in the future as his ALS progresses. I do not think that he is experiencing any pain from his urinary retention.   Sister wants to focus on less interventions and patient's comfort and therefore does not want to have an indwelling catheter placed or to do in and out catheterizations routinely due to discomfort.  Sister is trying to put together an AFL (Adult family living) through the state in [place].  She does not wish for him to go to a skilled facility at this time as she feels that he would not have care as good as he currently has and tells me that this would take him outside of the state system. She is currently seeking legal advice and has been told that there are care facilities within the state system that he is eligible for.  I have asked the group home to have their RN call me who did his home/placement assessment so we can discuss his current medical needs. I will also try to discuss case with social work.  60 minutes spent with pt today as well as with sister and group home staff. >50% of this time was spent counseling patient and family on patient's medical need.  Case was also discussed with Dr. Charlett Blake.

## 2016-11-06 ENCOUNTER — Telehealth: Payer: Self-pay | Admitting: Family

## 2016-11-06 MED ORDER — CLOTRIMAZOLE 1 % EX CREA: 1.0000 "application " | TOPICAL_CREAM | Freq: Two times a day (BID) | CUTANEOUS | 0 refills | Status: AC

## 2016-11-06 NOTE — Telephone Encounter (Signed)
Could you please call Beverly Sessions and see if we can get in touch with a social worker who can discuss his case with me? thanks

## 2016-11-08 NOTE — Telephone Encounter (Signed)
Noted  

## 2016-11-08 NOTE — Telephone Encounter (Signed)
Spoke with pt's sister. We discussed possibility of SNF.  She fears that he will be mistreated in that type of facility. He has been refused by the ICF-MR's that have evaluated him thus far.  I do think that ICF-MR would be medically appropriate for him. She wishes to continue to pursue placement in ICF rather than SNF.  Wants to "give it 3 more months."

## 2016-11-08 NOTE — Telephone Encounter (Signed)
I spoke with [facility] and was told that they do not keep social workers on staff and they utilize outside agencies for that.

## 2016-11-10 ENCOUNTER — Telehealth: Payer: Self-pay | Admitting: Family

## 2016-11-10 NOTE — Telephone Encounter (Signed)
Caller name: Dorena Cookey  Relation to pt: RN from [employer] back [phone]  Reason for call:  RN states NP would like to speak with her before signing FL2 form, please advise.[phone]

## 2016-11-11 NOTE — Telephone Encounter (Signed)
Contacted Dorena Cookey- no answer, left message for call back to my cell.

## 2016-11-14 NOTE — Telephone Encounter (Signed)
Please contact group home and sister and inform them that I have have completed the [facility] for ICF level of care.

## 2016-11-15 NOTE — Telephone Encounter (Signed)
[  11/15/2016 12:21 PM] Inocente Salles:  Delray Alt is here to p/u paperwork for Mr. Carne. Is that ok? She isn't on DPR. She said she spoke to you. [11/15/2016 12:21 PM] Luberta Mutter, Gilmore Laroche:  Yes, actually she is or was. she is the caregiver at the group home he stays in. thanks [11/15/2016 12:22 PM] Inocente Salles:  ok. thanks. Ok. Gave it to her. Thank you.

## 2016-11-15 NOTE — Telephone Encounter (Signed)
Addendum: I did speak with Dorena Cookey- RN who completed Mr. Heberle assessment on 4/19. She reports that in fact the patient has been accepted to several ICF-MR facilities but that his sister has refused the facilities which she has been given. RN is most concerned with his urinary symptoms and need for catheter placement. Sister has adamantly declined foley placement or in and out caths. At this point, I still think that an ICF would be an appropriate option for him especially since we will not be adding any additional skilled needs at this time. I have signed FL2 for ICF placement.

## 2016-11-15 NOTE — Telephone Encounter (Signed)
Left detailed message on pt's sister's voicemail re: form completion. Notified pt Edward [name] at the group home that forms have been placed at the front desk for pick. Copy has been sent for scanning.

## 2016-11-18 DIAGNOSIS — R338 Other retention of urine: Secondary | ICD-10-CM | POA: Diagnosis not present

## 2016-11-24 ENCOUNTER — Other Ambulatory Visit: Payer: Self-pay | Admitting: Family

## 2016-11-25 NOTE — Telephone Encounter (Signed)
Refill sent per [facility] refill protocol/SLS  

## 2016-11-25 NOTE — Telephone Encounter (Signed)
Caller name: DHAVAL WOO [facility]en Fass Call back number: [phone]  Pharmacy:[facility]  Reason for call:  Edward [name] (SISTER /LEGAL [place] lvm requesting a refill for NUTRITIONAL SUPPLEMENT LIQD, , please advise

## 2016-11-29 ENCOUNTER — Telehealth: Payer: Self-pay | Admitting: Family

## 2016-11-29 ENCOUNTER — Other Ambulatory Visit: Payer: Self-pay | Admitting: Family

## 2016-11-29 NOTE — Telephone Encounter (Signed)
Attempted to reach Joe [facility]) at below # and left message to return my call.

## 2016-11-29 NOTE — Telephone Encounter (Signed)
Caller name: Wille Glaser Relationship to patient: Care Coordinator  Can be reached: [phone] Pharmacy:  Reason for call: Care Coordinator request call back to discuss patient's care

## 2016-11-30 NOTE — Telephone Encounter (Signed)
Request for 90-day supply granted D/C PREVIOUS SCRIPTS FOR THIS MEDICATION/SLS /

## 2016-11-30 NOTE — Telephone Encounter (Signed)
Spoke with Care Coordinator, Joe. He is wanting PCPs recommendation for medical care and staff credentials (number and level of staff) needed to Provide care for pt.  States he is having difficulty getting any ICF facility across the state to accept pt for care due to his medical needs. Current facility only has 1 staff per 6 individuals and no "awake staff" thru the night.  States ICF is similar to facility he is in but with more staff. Facilities are concerned about his bladder (?kidney failure) and mobility needs (?Parkinsons). Guardian will not allow current facility to use wheelchair so they use gait belt and walker and they are concerned about pt's frequent falls. States current day program is wanting to discharge him from their program because pt refuses to participate. Day program is activity based and they report that pt refuses, curses and yells. States pt is full dependent for bathing and constant changing of his depends because of constant bladder leakage. States pt qualifies for AFL (Aternate Family Living / family environment) but doesn't think that would work out due to his medical needs. He is wanting to verify if ICF is accurate level of care for pt? Urology suggests SNF because of need for catheter. Do you know of any other suggestions outside of ICF facility?

## 2016-12-01 NOTE — Telephone Encounter (Signed)
Notified pt's sister (Guardian), of below recommendation. She was very upset and still feels like the current group home and ICF facilities that are denying him are doing so out of "not wanting to accept the work" that pt currently requires. She is concerned that if pt is placed in a SNF that his health / mobility will decline rapidly, that he will not get the direct care he needs. She will try to contact her lawyer and the case manager at the group home. She requests that PCP call her in 24 hours to give her time to talk with the lawyer she has contacted for the disabled. She would like to discuss with PCP?

## 2016-12-01 NOTE — Telephone Encounter (Signed)
Given that the group home is unable to safely care for him and we are unable to find an ICF level of placement for him, my recommendation at this time is SNF for Edward [name]. Please notify Care Coordinator and patient's sister Edward [name].

## 2016-12-02 NOTE — Telephone Encounter (Addendum)
Caller name:Fishburn,[name] Relation to pt:  Guardian / sister  Call back number: [phone]   Reason for call:  Checking on the status of message below, informed sister PCP is out of the office today and will follow up.

## 2016-12-02 NOTE — Telephone Encounter (Signed)
Called sister, no answer, left message.

## 2016-12-03 NOTE — Telephone Encounter (Signed)
Edward [name] returned call but PCP in clinic with pt's and states she will call her at lunch. Edward [name] is in agreement and awaits PCP call.

## 2016-12-06 NOTE — Telephone Encounter (Signed)
Late entry. Spoke to patient's sister on 12/03/16 at 12:30 PM.  She states that she is working with an attorney for disable persons in regards to his care.  She adamantly refuses his placement in a SNF facility and believes that he will best be served in the state system.  She is looking into another facility closer to her home and notes that she plans to remove him from his current situation in the next month or two. States she will even take him home with her if needed.  She states that the case manager Joe denied calling us to discuss another level of care and told her that he only contacted [place] to request patient records.  To my knowledge, they did not request records from our office. I advised her that we do not release patient records to anyone other than the HCPOA.   Please let the facility know that I spoke to the sister.  I do feel that ICF level of care would be appropriate if they can find placement.  While SNF would also be a viable option, the family is refusing SNF.

## 2016-12-06 NOTE — Telephone Encounter (Signed)
Notified Joe that we are unable to release records at this time as pt's HCPOA has declined consent to release record.

## 2016-12-06 NOTE — Telephone Encounter (Signed)
Notified Joe at [facility]. He requests that we send most recent office note since they have not received any from [place] in the past. Only has the handwritten orders from each visit. Spoke with pt's HCPOA regarding request and she does not want [place] to release records at this time. Currently has a lawyer that has advised her not to release records at this time. Will notify Joe.

## 2016-12-16 DIAGNOSIS — R338 Other retention of urine: Secondary | ICD-10-CM | POA: Diagnosis not present

## 2016-12-22 ENCOUNTER — Ambulatory Visit (INDEPENDENT_AMBULATORY_CARE_PROVIDER_SITE_OTHER): Payer: Medicare Other | Admitting: Podiatry

## 2016-12-22 ENCOUNTER — Encounter: Payer: Self-pay | Admitting: Podiatry

## 2016-12-22 DIAGNOSIS — M79676 Pain in unspecified toe(s): Secondary | ICD-10-CM

## 2016-12-22 DIAGNOSIS — B351 Tinea unguium: Secondary | ICD-10-CM | POA: Diagnosis not present

## 2016-12-22 DIAGNOSIS — E0842 Diabetes mellitus due to underlying condition with diabetic polyneuropathy: Secondary | ICD-10-CM

## 2016-12-23 NOTE — Progress Notes (Signed)
   SUBJECTIVE Patient with a history of diabetes mellitus presents to office today complaining of elongated, thickened nails. Pain while ambulating in shoes. Patient is unable to trim their own nails.   OBJECTIVE General Patient is awake, alert, and oriented x 3 and in no acute distress. Derm Skin is dry and supple bilateral. Negative open lesions or macerations. Remaining integument unremarkable. Nails are tender, long, thickened and dystrophic with subungual debris, consistent with onychomycosis, 1-5 bilateral. No signs of infection noted. Vasc  DP and PT pedal pulses palpable bilaterally. Temperature gradient within normal limits.  Neuro Epicritic and protective threshold sensation diminished bilaterally.  Musculoskeletal Exam No symptomatic pedal deformities noted bilateral. Muscular strength within normal limits.  ASSESSMENT 1. Diabetes Mellitus w/ peripheral neuropathy 2. Onychomycosis of nail due to dermatophyte bilateral 3. Pain in foot bilateral  PLAN OF CARE 1. Patient evaluated today. 2. Instructed to maintain good pedal hygiene and foot care. Stressed importance of controlling blood sugar.  3. Mechanical debridement of nails 1-5 bilaterally performed using a nail nipper. Filed with dremel without incident.  4. Return to clinic in 3 mos.     Alessio Bogan M. Wilna Pennie, DPM [facility]  Dr. Azlyn Wingler M. Abbye Lao, DPM    [address]                                        [place], [place] [address]                Office [phone]  Fax [phone]       

## 2016-12-24 DIAGNOSIS — N400 Enlarged prostate without lower urinary tract symptoms: Secondary | ICD-10-CM | POA: Diagnosis not present

## 2016-12-24 DIAGNOSIS — R339 Retention of urine, unspecified: Secondary | ICD-10-CM | POA: Diagnosis not present

## 2016-12-24 DIAGNOSIS — E119 Type 2 diabetes mellitus without complications: Secondary | ICD-10-CM | POA: Diagnosis not present

## 2016-12-24 DIAGNOSIS — F209 Schizophrenia, unspecified: Secondary | ICD-10-CM | POA: Diagnosis not present

## 2016-12-24 DIAGNOSIS — R0602 Shortness of breath: Secondary | ICD-10-CM | POA: Diagnosis not present

## 2016-12-24 DIAGNOSIS — R251 Tremor, unspecified: Secondary | ICD-10-CM | POA: Diagnosis not present

## 2016-12-24 DIAGNOSIS — F79 Unspecified intellectual disabilities: Secondary | ICD-10-CM | POA: Diagnosis not present

## 2016-12-24 DIAGNOSIS — I10 Essential (primary) hypertension: Secondary | ICD-10-CM | POA: Diagnosis not present

## 2016-12-24 DIAGNOSIS — Z79899 Other long term (current) drug therapy: Secondary | ICD-10-CM | POA: Diagnosis not present

## 2016-12-24 DIAGNOSIS — Z7982 Long term (current) use of aspirin: Secondary | ICD-10-CM | POA: Diagnosis not present

## 2016-12-25 DIAGNOSIS — R339 Retention of urine, unspecified: Secondary | ICD-10-CM | POA: Diagnosis not present

## 2016-12-25 DIAGNOSIS — R100 Acute abdomen: Secondary | ICD-10-CM | POA: Diagnosis not present

## 2016-12-27 DIAGNOSIS — N139 Obstructive and reflux uropathy, unspecified: Secondary | ICD-10-CM | POA: Diagnosis not present

## 2016-12-27 DIAGNOSIS — N400 Enlarged prostate without lower urinary tract symptoms: Secondary | ICD-10-CM | POA: Diagnosis not present

## 2017-01-05 ENCOUNTER — Ambulatory Visit (INDEPENDENT_AMBULATORY_CARE_PROVIDER_SITE_OTHER): Payer: Medicare Other | Admitting: Family

## 2017-01-05 ENCOUNTER — Encounter: Payer: Self-pay | Admitting: Family

## 2017-01-05 VITALS — BP 126/58 | HR 70 | Temp 98.1°F | Resp 16 | Ht 70.0 in | Wt 171.4 lb

## 2017-01-05 DIAGNOSIS — N289 Disorder of kidney and ureter, unspecified: Secondary | ICD-10-CM | POA: Diagnosis not present

## 2017-01-05 DIAGNOSIS — R339 Retention of urine, unspecified: Secondary | ICD-10-CM

## 2017-01-05 DIAGNOSIS — D649 Anemia, unspecified: Secondary | ICD-10-CM | POA: Diagnosis not present

## 2017-01-05 DIAGNOSIS — I10 Essential (primary) hypertension: Secondary | ICD-10-CM | POA: Diagnosis not present

## 2017-01-05 DIAGNOSIS — D509 Iron deficiency anemia, unspecified: Secondary | ICD-10-CM | POA: Diagnosis not present

## 2017-01-05 LAB — CBC WITH DIFFERENTIAL/PLATELET
BASOS ABS: 0.1 10*3/uL (ref 0.0–0.1)
BASOS PCT: 1.1 % (ref 0.0–3.0)
EOS ABS: 0.4 10*3/uL (ref 0.0–0.7)
Eosinophils Relative: 5.3 % — ABNORMAL HIGH (ref 0.0–5.0)
HEMATOCRIT: 33.1 % — AB (ref 39.0–52.0)
HEMOGLOBIN: 10.8 g/dL — AB (ref 13.0–17.0)
LYMPHS PCT: 20.7 % (ref 12.0–46.0)
Lymphs Abs: 1.5 10*3/uL (ref 0.7–4.0)
MCHC: 32.8 g/dL (ref 30.0–36.0)
MCV: 92.9 fl (ref 78.0–100.0)
MONO ABS: 0.3 10*3/uL (ref 0.1–1.0)
Monocytes Relative: 4.6 % (ref 3.0–12.0)
Neutro Abs: 5 10*3/uL (ref 1.4–7.7)
Neutrophils Relative %: 68.3 % (ref 43.0–77.0)
PLATELETS: 360 10*3/uL (ref 150.0–400.0)
RBC: 3.56 Mil/uL — ABNORMAL LOW (ref 4.22–5.81)
RDW: 15.5 % (ref 11.5–15.5)
WBC: 7.4 10*3/uL (ref 4.0–10.5)

## 2017-01-05 LAB — BASIC METABOLIC PANEL
BUN: 34 mg/dL — ABNORMAL HIGH (ref 6–23)
CALCIUM: 9 mg/dL (ref 8.4–10.5)
CHLORIDE: 106 meq/L (ref 96–112)
CO2: 25 meq/L (ref 19–32)
CREATININE: 1.72 mg/dL — AB (ref 0.40–1.50)
GFR: 41.59 mL/min — ABNORMAL LOW (ref >60.00)
Glucose, Bld: 174 mg/dL — ABNORMAL HIGH (ref 70–99)
Potassium: 4 mEq/L (ref 3.5–5.1)
SODIUM: 140 meq/L (ref 135–145)

## 2017-01-05 LAB — IRON: Iron: 51 ug/dL (ref 42–165)

## 2017-01-05 LAB — FERRITIN: FERRITIN: 375.9 ng/mL — AB (ref 22.0–322.0)

## 2017-01-05 NOTE — Progress Notes (Signed)
Subjective:    Patient ID: Edward [name], male    DOB: 01/08/1946, 71 y.o.   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 He will be moving to a group home closer to his sister in [place] and this will be his last visit with [place]. He is accompanied by his sister and a worker from his group home.  Urinary retention- was recently seen at [facility] with urinary retention on 12/24/16.  [facility] well since this visit. Caregiver noted that he initially complained of sob and chest pain.  Troponin was negative.  He had a foley placed briefly. Went to urology on Monday. Had about 400 cc removed.  Continues to have urinary incontinence.    Renal insufficiency- last Cr was 1.73 at [facility] (labs reviewed).    Anemia- was noted to have hgb 10.5. He continues bid iron.   Review of Systems See HPI  Past Medical History:  Diagnosis Date  . 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 Anemia   . BPH (benign prostatic hyperplasia)   . Cataract   . Diabetes mellitus   . Hypertension    DENIES  . Mental retardation   . Schizophrenia ([place])   . Thyroid disease   . Urinary incontinence      Social History   Social History  . Marital status: Single    Spouse name: N/A  . Number of children: N/A  . Years of education: N/A   Occupational History  . Not on file.   Social History Main Topics  . Smoking status: Never Smoker  . Smokeless tobacco: Current User    Types: Chew     Comment: chews tobacco  . Alcohol use No  . Drug use: No  . Sexual activity: Not on file   Other Topics Concern  . Not on file   Social History Narrative   Lives in a group home   Sister [name] is his legal guardian.   Sister and her family live locally    Past Surgical History:  Procedure Laterality Date  . PROSTATE SURGERY     x2, ?procedure    Family History  Problem Relation Age of Onset    . Arthritis Mother   . Arrhythmia Mother   . Lymphoma Mother        non-hodgkins  . Kidney disease Mother   . Heart disease Father   . Stroke Father     No Known Allergies  Current Outpatient Prescriptions on File Prior to Visit  Medication Sig Dispense Refill  . aspirin 81 MG tablet Take 81 mg by mouth daily.    . Blood Glucose Monitoring Suppl (ACCU-CHEK AVIVA PLUS) W/DEVICE KIT DX E11.9 1 kit 0  . Calcium Carbonate-Vitamin D (CALCIUM 600+D) 600-400 MG-UNIT tablet Take 1 tablet by mouth daily. 30 tablet 5  . clotrimazole (LOTRIMIN) 1 % cream Apply 1 application topically 2 (two) times daily. Apply once daily as needed for ringworm, athletes fee, jock or groin itch 30 g 0  . docusate sodium (COLACE) 100 MG capsule Take 1 capsule (100 mg total) by mouth 2 (two) times daily. 60 capsule 5  . dutasteride (AVODART) 0.5 MG capsule Take 1 capsule (0.5 mg total) by mouth daily. 30 capsule 5  . Emollient (CERAVE) CREA Apply 1 application topically. For dry skin on feet and legs    .  ferrous sulfate 325 (65 FE) MG tablet Take 1 tablet (325 mg total) by mouth 2 (two) times daily with a meal. 60 tablet 5  . FLUoxetine (PROZAC) 10 MG capsule Take 1 capsule by mouth daily.    Marland Kitchen glucose blood (ACCU-CHEK AVIVA) test strip Use as instructed to check blood sugar once a day.  DX  E11.9 100 each 1  . Hydrocodone-Acetaminophen 2.5-325 MG TABS Take 1 tablet by mouth 2 (two) times daily as needed. 45 tablet 0  . ketoconazole (NIZORAL) 2 % cream Apply 1 application topically 2 (two) times daily. 3 days a week, Monday, Wednesday and friday 15 g   . Lancet Devices (ACCU-CHEK SOFTCLIX) lancets Use as instructed  DX E11.9 10 each 1  . levothyroxine (SYNTHROID, LEVOTHROID) 100 MCG tablet Take 1 tablet (100 mcg total) by mouth daily. 30 tablet 5  . lip balm (BLISTEX) OINT Apply 1 application topically 4 (four) times daily as needed for lip care.    . Loperamide HCl (IMODIUM A-D PO) Take by mouth as needed.    .  meloxicam (MOBIC) 7.5 MG tablet Take 1 tablet (7.5 mg total) by mouth daily. 14 tablet 0  . NUTRITIONAL SUPPLEMENT LIQD Drink on can once daily 90 Can 0  . polyethylene glycol powder (GLYCOLAX/MIRALAX) powder 1 packet in 8 oz of water or juice once daily AS NEEDED for constipation 3350 g 1  . QUEtiapine (SEROQUEL) 50 MG tablet Take 50 mg by mouth 2 (two) times daily.    . Simethicone (MYLANTA GAS PO) Take by mouth as needed.    . tamsulosin (FLOMAX) 0.4 MG CAPS capsule Take 1 capsule (0.4 mg total) by mouth at bedtime. 30 capsule 2  . thioridazine (MELLARIL) 100 MG tablet Take 200 mg by mouth at bedtime.     Marland Kitchen thioridazine (MELLARIL) 50 MG tablet Take 1 tablet (50 mg total) by mouth every morning. 30 tablet 10   No current facility-administered medications on file prior to visit.     BP (!) 126/58 (BP Location: Right Arm, Cuff Size: Normal)   Pulse 70   Temp 98.1 F (36.7 C) (Oral)   Resp 16   Ht 5' 10"  (1.778 m)   Wt 171 lb 6.4 oz (77.7 kg)   SpO2 100%   BMI 24.59 kg/m       Objective:   Physical Exam  Constitutional: He appears well-developed and well-nourished. No distress.  HENT:  Head: Normocephalic and atraumatic.  Cardiovascular: Normal rate and regular rhythm.   No murmur heard. Pulmonary/Chest: Effort normal and breath sounds normal. No respiratory distress. He has no wheezes. He has no rales.  Musculoskeletal: He exhibits no edema.  Muscle atrophy noted bilateral hands  Neurological: He is alert.  Skin: Skin is warm and dry.          Assessment & Plan:

## 2017-01-05 NOTE — Patient Instructions (Signed)
Please complete lab work prior to leaving. Good luck at your new home!

## 2017-01-06 ENCOUNTER — Telehealth: Payer: Self-pay | Admitting: Family

## 2017-01-06 DIAGNOSIS — N289 Disorder of kidney and ureter, unspecified: Secondary | ICD-10-CM | POA: Insufficient documentation

## 2017-01-06 DIAGNOSIS — R339 Retention of urine, unspecified: Secondary | ICD-10-CM | POA: Insufficient documentation

## 2017-01-06 MED ORDER — FERROUS SULFATE 325 (65 FE) MG PO TABS: 325.0000 mg | ORAL_TABLET | Freq: Every day | ORAL | 5 refills | Status: DC

## 2017-01-06 NOTE — Telephone Encounter (Signed)
Kidney function is stable. He is still anemic but iron looks ok. Change iron from twice daily to once daily.

## 2017-01-06 NOTE — Assessment & Plan Note (Signed)
Lab Results  Component Value Date   CREATININE 1.72 (H) 01/05/2017   Stable.  Monitor.

## 2017-01-06 NOTE — Assessment & Plan Note (Signed)
BP Readings from Last 3 Encounters:  01/05/17 (!) 126/58  11/05/16 131/63  10/01/16 125/67   BP stable. Not current on BP medication.

## 2017-01-06 NOTE — Assessment & Plan Note (Signed)
Continues to follow with urology.  Family has declined foley catheter.

## 2017-01-06 NOTE — Assessment & Plan Note (Signed)
Check follow up cbc, serum iron. Family has declined further  GI work up.

## 2017-01-07 NOTE — Telephone Encounter (Signed)
LMOM informing [name] sister Golda Acre of lab results and recommendations. Instructed to call if questions/concerns.

## 2017-01-13 DIAGNOSIS — R338 Other retention of urine: Secondary | ICD-10-CM | POA: Diagnosis not present

## 2017-01-14 NOTE — Telephone Encounter (Signed)
[name] sister called back states that the group home will need a new order to change the iron to once daily, states it can be faxed if any questions please contact Alwyn Ren at the group home

## 2017-01-17 MED ORDER — FERROUS SULFATE 325 (65 FE) MG PO TABS: 325.0000 mg | ORAL_TABLET | Freq: Every day | ORAL | 5 refills | Status: AC

## 2017-01-17 NOTE — Addendum Note (Signed)
Addended by: Kelle Darting A on: 01/17/2017 09:21 AM   Modules accepted: Orders

## 2017-01-17 NOTE — Telephone Encounter (Signed)
Rx sent with new directions to [facility]. Faxed order from phone note to Butch Penny [phone]).

## 2017-01-31 ENCOUNTER — Telehealth: Payer: Self-pay | Admitting: *Deleted

## 2017-01-31 NOTE — Telephone Encounter (Signed)
Received call from pt's sister, Golda Acre stating pt just moved to his new facility near [place] on Thursday. She received call from nurse at the group home today stating pt Has a "pretty bad bed sore on his bottom" and she was wondering if pt's previous group home had contacted [place] about the sore in the last 2 weeks prior to his move. She states she was out of the country over the last 2 weeks but had not been contacted by anyone that she could see. Advised her no record in our system of contact from previous group home. She also asked if we had received a records release from [facility] (new group home). Advised her I have seen no request and request has not been scanned into the record yet.

## 2017-02-02 ENCOUNTER — Telehealth: Payer: Self-pay | Admitting: Family

## 2017-02-02 NOTE — Telephone Encounter (Signed)
Edward [name] printed FL2 and placed in your red folder for review before [name] fax it. Please advise?

## 2017-02-02 NOTE — Telephone Encounter (Signed)
Patients sister called in requesting for a copy of the patients FL2 be sent to his new facility. Most recent FL2 was completed on 11/24/16 and she states this was the most recent and the new facility would accept this.   Would you verify that this information is still correct before we fax the form over  Fax is [phone]  Sister can be reached at [phone] with questions

## 2017-02-03 NOTE — Telephone Encounter (Signed)
FL2 reviewed. OK to send. Tks.

## 2017-02-03 NOTE — Telephone Encounter (Signed)
FL2 copy faxed to below #. Notified pt's sister.

## 2017-03-09 DIAGNOSIS — R5383 Other fatigue: Secondary | ICD-10-CM | POA: Diagnosis not present

## 2017-03-09 DIAGNOSIS — F79 Unspecified intellectual disabilities: Secondary | ICD-10-CM | POA: Diagnosis not present

## 2017-03-09 DIAGNOSIS — R338 Other retention of urine: Secondary | ICD-10-CM | POA: Diagnosis not present

## 2017-03-09 DIAGNOSIS — K5900 Constipation, unspecified: Secondary | ICD-10-CM | POA: Diagnosis not present

## 2017-03-09 DIAGNOSIS — E139 Other specified diabetes mellitus without complications: Secondary | ICD-10-CM | POA: Diagnosis not present

## 2017-03-09 DIAGNOSIS — D649 Anemia, unspecified: Secondary | ICD-10-CM | POA: Diagnosis not present

## 2017-03-09 DIAGNOSIS — F209 Schizophrenia, unspecified: Secondary | ICD-10-CM | POA: Diagnosis not present

## 2017-03-09 DIAGNOSIS — N183 Chronic kidney disease, stage 3 (moderate): Secondary | ICD-10-CM | POA: Diagnosis not present

## 2017-03-09 DIAGNOSIS — E039 Hypothyroidism, unspecified: Secondary | ICD-10-CM | POA: Diagnosis not present

## 2017-03-09 DIAGNOSIS — Z66 Do not resuscitate: Secondary | ICD-10-CM | POA: Diagnosis not present

## 2017-03-10 DIAGNOSIS — R338 Other retention of urine: Secondary | ICD-10-CM | POA: Diagnosis not present

## 2017-03-10 DIAGNOSIS — N471 Phimosis: Secondary | ICD-10-CM | POA: Diagnosis not present

## 2017-04-07 DIAGNOSIS — R339 Retention of urine, unspecified: Secondary | ICD-10-CM | POA: Diagnosis not present

## 2017-04-14 ENCOUNTER — Ambulatory Visit: Payer: Self-pay | Admitting: Neurology

## 2017-04-20 DIAGNOSIS — E039 Hypothyroidism, unspecified: Secondary | ICD-10-CM | POA: Diagnosis not present

## 2017-04-20 DIAGNOSIS — Z79899 Other long term (current) drug therapy: Secondary | ICD-10-CM | POA: Diagnosis not present

## 2017-04-20 DIAGNOSIS — Z7982 Long term (current) use of aspirin: Secondary | ICD-10-CM | POA: Diagnosis not present

## 2017-04-20 DIAGNOSIS — R5383 Other fatigue: Secondary | ICD-10-CM | POA: Diagnosis not present

## 2017-04-20 DIAGNOSIS — R1110 Vomiting, unspecified: Secondary | ICD-10-CM | POA: Diagnosis not present

## 2017-04-20 DIAGNOSIS — E139 Other specified diabetes mellitus without complications: Secondary | ICD-10-CM | POA: Diagnosis not present

## 2017-04-20 DIAGNOSIS — D649 Anemia, unspecified: Secondary | ICD-10-CM | POA: Diagnosis not present

## 2017-04-21 DIAGNOSIS — Z720 Tobacco use: Secondary | ICD-10-CM | POA: Diagnosis not present

## 2017-04-21 DIAGNOSIS — N401 Enlarged prostate with lower urinary tract symptoms: Secondary | ICD-10-CM | POA: Diagnosis not present

## 2017-04-21 DIAGNOSIS — N1330 Unspecified hydronephrosis: Secondary | ICD-10-CM | POA: Diagnosis not present

## 2017-04-21 DIAGNOSIS — N320 Bladder-neck obstruction: Secondary | ICD-10-CM | POA: Diagnosis not present

## 2017-04-21 DIAGNOSIS — F209 Schizophrenia, unspecified: Secondary | ICD-10-CM | POA: Diagnosis not present

## 2017-04-21 DIAGNOSIS — N179 Acute kidney failure, unspecified: Secondary | ICD-10-CM | POA: Diagnosis not present

## 2017-04-21 DIAGNOSIS — F79 Unspecified intellectual disabilities: Secondary | ICD-10-CM | POA: Diagnosis not present

## 2017-04-21 DIAGNOSIS — R1110 Vomiting, unspecified: Secondary | ICD-10-CM | POA: Diagnosis not present

## 2017-04-21 DIAGNOSIS — K5900 Constipation, unspecified: Secondary | ICD-10-CM | POA: Diagnosis not present

## 2017-04-21 DIAGNOSIS — K5641 Fecal impaction: Secondary | ICD-10-CM | POA: Diagnosis not present

## 2017-04-21 DIAGNOSIS — N138 Other obstructive and reflux uropathy: Secondary | ICD-10-CM | POA: Diagnosis not present

## 2017-04-22 DIAGNOSIS — R112 Nausea with vomiting, unspecified: Secondary | ICD-10-CM | POA: Diagnosis not present

## 2017-04-22 DIAGNOSIS — N390 Urinary tract infection, site not specified: Secondary | ICD-10-CM | POA: Diagnosis not present

## 2017-04-22 DIAGNOSIS — R339 Retention of urine, unspecified: Secondary | ICD-10-CM | POA: Diagnosis not present

## 2017-04-22 DIAGNOSIS — K5641 Fecal impaction: Secondary | ICD-10-CM | POA: Diagnosis not present

## 2017-04-22 DIAGNOSIS — N179 Acute kidney failure, unspecified: Secondary | ICD-10-CM | POA: Diagnosis not present

## 2017-04-23 DIAGNOSIS — K5641 Fecal impaction: Secondary | ICD-10-CM | POA: Diagnosis not present

## 2017-04-23 DIAGNOSIS — R109 Unspecified abdominal pain: Secondary | ICD-10-CM | POA: Diagnosis not present

## 2017-04-23 DIAGNOSIS — N179 Acute kidney failure, unspecified: Secondary | ICD-10-CM | POA: Diagnosis not present

## 2017-04-23 DIAGNOSIS — K5900 Constipation, unspecified: Secondary | ICD-10-CM | POA: Diagnosis not present

## 2017-04-23 DIAGNOSIS — R112 Nausea with vomiting, unspecified: Secondary | ICD-10-CM | POA: Diagnosis not present

## 2017-04-23 DIAGNOSIS — J189 Pneumonia, unspecified organism: Secondary | ICD-10-CM | POA: Diagnosis not present

## 2017-04-23 DIAGNOSIS — N390 Urinary tract infection, site not specified: Secondary | ICD-10-CM | POA: Diagnosis not present

## 2017-04-24 DIAGNOSIS — K5641 Fecal impaction: Secondary | ICD-10-CM | POA: Diagnosis not present

## 2017-04-24 DIAGNOSIS — N179 Acute kidney failure, unspecified: Secondary | ICD-10-CM | POA: Diagnosis not present

## 2017-04-24 DIAGNOSIS — N390 Urinary tract infection, site not specified: Secondary | ICD-10-CM | POA: Diagnosis not present

## 2017-04-28 DIAGNOSIS — N138 Other obstructive and reflux uropathy: Secondary | ICD-10-CM | POA: Diagnosis not present

## 2017-04-28 DIAGNOSIS — M62542 Muscle wasting and atrophy, not elsewhere classified, left hand: Secondary | ICD-10-CM | POA: Diagnosis not present

## 2017-04-28 DIAGNOSIS — F79 Unspecified intellectual disabilities: Secondary | ICD-10-CM | POA: Diagnosis not present

## 2017-04-28 DIAGNOSIS — Z7982 Long term (current) use of aspirin: Secondary | ICD-10-CM | POA: Diagnosis not present

## 2017-04-28 DIAGNOSIS — R2689 Other abnormalities of gait and mobility: Secondary | ICD-10-CM | POA: Diagnosis not present

## 2017-04-28 DIAGNOSIS — Z87440 Personal history of urinary (tract) infections: Secondary | ICD-10-CM | POA: Diagnosis not present

## 2017-04-28 DIAGNOSIS — M62541 Muscle wasting and atrophy, not elsewhere classified, right hand: Secondary | ICD-10-CM | POA: Diagnosis not present

## 2017-04-28 DIAGNOSIS — N401 Enlarged prostate with lower urinary tract symptoms: Secondary | ICD-10-CM | POA: Diagnosis not present

## 2017-04-28 DIAGNOSIS — N1330 Unspecified hydronephrosis: Secondary | ICD-10-CM | POA: Diagnosis not present

## 2017-04-28 DIAGNOSIS — R253 Fasciculation: Secondary | ICD-10-CM | POA: Diagnosis not present

## 2017-04-28 DIAGNOSIS — F259 Schizoaffective disorder, unspecified: Secondary | ICD-10-CM | POA: Diagnosis not present

## 2017-05-03 DIAGNOSIS — N471 Phimosis: Secondary | ICD-10-CM | POA: Diagnosis not present

## 2017-05-03 DIAGNOSIS — R339 Retention of urine, unspecified: Secondary | ICD-10-CM | POA: Diagnosis not present

## 2017-05-05 DIAGNOSIS — F79 Unspecified intellectual disabilities: Secondary | ICD-10-CM | POA: Diagnosis not present

## 2017-05-05 DIAGNOSIS — M62542 Muscle wasting and atrophy, not elsewhere classified, left hand: Secondary | ICD-10-CM | POA: Diagnosis not present

## 2017-05-05 DIAGNOSIS — N1330 Unspecified hydronephrosis: Secondary | ICD-10-CM | POA: Diagnosis not present

## 2017-05-05 DIAGNOSIS — M62541 Muscle wasting and atrophy, not elsewhere classified, right hand: Secondary | ICD-10-CM | POA: Diagnosis not present

## 2017-05-05 DIAGNOSIS — R2689 Other abnormalities of gait and mobility: Secondary | ICD-10-CM | POA: Diagnosis not present

## 2017-05-05 DIAGNOSIS — F259 Schizoaffective disorder, unspecified: Secondary | ICD-10-CM | POA: Diagnosis not present

## 2017-05-10 DIAGNOSIS — C4491 Basal cell carcinoma of skin, unspecified: Secondary | ICD-10-CM | POA: Diagnosis not present

## 2017-05-10 DIAGNOSIS — D492 Neoplasm of unspecified behavior of bone, soft tissue, and skin: Secondary | ICD-10-CM | POA: Diagnosis not present

## 2017-05-10 DIAGNOSIS — K5909 Other constipation: Secondary | ICD-10-CM | POA: Diagnosis not present

## 2017-05-10 DIAGNOSIS — Z23 Encounter for immunization: Secondary | ICD-10-CM | POA: Diagnosis not present

## 2017-05-12 DIAGNOSIS — F79 Unspecified intellectual disabilities: Secondary | ICD-10-CM | POA: Diagnosis not present

## 2017-05-12 DIAGNOSIS — R2689 Other abnormalities of gait and mobility: Secondary | ICD-10-CM | POA: Diagnosis not present

## 2017-05-12 DIAGNOSIS — M62542 Muscle wasting and atrophy, not elsewhere classified, left hand: Secondary | ICD-10-CM | POA: Diagnosis not present

## 2017-05-12 DIAGNOSIS — M62541 Muscle wasting and atrophy, not elsewhere classified, right hand: Secondary | ICD-10-CM | POA: Diagnosis not present

## 2017-05-12 DIAGNOSIS — N1330 Unspecified hydronephrosis: Secondary | ICD-10-CM | POA: Diagnosis not present

## 2017-05-12 DIAGNOSIS — F259 Schizoaffective disorder, unspecified: Secondary | ICD-10-CM | POA: Diagnosis not present

## 2017-05-19 DIAGNOSIS — F79 Unspecified intellectual disabilities: Secondary | ICD-10-CM | POA: Diagnosis not present

## 2017-05-19 DIAGNOSIS — M62542 Muscle wasting and atrophy, not elsewhere classified, left hand: Secondary | ICD-10-CM | POA: Diagnosis not present

## 2017-05-19 DIAGNOSIS — N1330 Unspecified hydronephrosis: Secondary | ICD-10-CM | POA: Diagnosis not present

## 2017-05-19 DIAGNOSIS — F259 Schizoaffective disorder, unspecified: Secondary | ICD-10-CM | POA: Diagnosis not present

## 2017-05-19 DIAGNOSIS — M62541 Muscle wasting and atrophy, not elsewhere classified, right hand: Secondary | ICD-10-CM | POA: Diagnosis not present

## 2017-05-19 DIAGNOSIS — R2689 Other abnormalities of gait and mobility: Secondary | ICD-10-CM | POA: Diagnosis not present

## 2017-05-25 DIAGNOSIS — N1330 Unspecified hydronephrosis: Secondary | ICD-10-CM | POA: Diagnosis not present

## 2017-05-25 DIAGNOSIS — F259 Schizoaffective disorder, unspecified: Secondary | ICD-10-CM | POA: Diagnosis not present

## 2017-05-25 DIAGNOSIS — F79 Unspecified intellectual disabilities: Secondary | ICD-10-CM | POA: Diagnosis not present

## 2017-05-25 DIAGNOSIS — M62542 Muscle wasting and atrophy, not elsewhere classified, left hand: Secondary | ICD-10-CM | POA: Diagnosis not present

## 2017-05-25 DIAGNOSIS — M62541 Muscle wasting and atrophy, not elsewhere classified, right hand: Secondary | ICD-10-CM | POA: Diagnosis not present

## 2017-05-25 DIAGNOSIS — R2689 Other abnormalities of gait and mobility: Secondary | ICD-10-CM | POA: Diagnosis not present

## 2017-06-01 DIAGNOSIS — M62541 Muscle wasting and atrophy, not elsewhere classified, right hand: Secondary | ICD-10-CM | POA: Diagnosis not present

## 2017-06-01 DIAGNOSIS — M62542 Muscle wasting and atrophy, not elsewhere classified, left hand: Secondary | ICD-10-CM | POA: Diagnosis not present

## 2017-06-01 DIAGNOSIS — F79 Unspecified intellectual disabilities: Secondary | ICD-10-CM | POA: Diagnosis not present

## 2017-06-01 DIAGNOSIS — F259 Schizoaffective disorder, unspecified: Secondary | ICD-10-CM | POA: Diagnosis not present

## 2017-06-01 DIAGNOSIS — R2689 Other abnormalities of gait and mobility: Secondary | ICD-10-CM | POA: Diagnosis not present

## 2017-06-01 DIAGNOSIS — N1330 Unspecified hydronephrosis: Secondary | ICD-10-CM | POA: Diagnosis not present

## 2017-06-07 DIAGNOSIS — F259 Schizoaffective disorder, unspecified: Secondary | ICD-10-CM | POA: Diagnosis not present

## 2017-06-07 DIAGNOSIS — R2689 Other abnormalities of gait and mobility: Secondary | ICD-10-CM | POA: Diagnosis not present

## 2017-06-07 DIAGNOSIS — N1330 Unspecified hydronephrosis: Secondary | ICD-10-CM | POA: Diagnosis not present

## 2017-06-07 DIAGNOSIS — M62541 Muscle wasting and atrophy, not elsewhere classified, right hand: Secondary | ICD-10-CM | POA: Diagnosis not present

## 2017-06-07 DIAGNOSIS — F79 Unspecified intellectual disabilities: Secondary | ICD-10-CM | POA: Diagnosis not present

## 2017-06-07 DIAGNOSIS — M62542 Muscle wasting and atrophy, not elsewhere classified, left hand: Secondary | ICD-10-CM | POA: Diagnosis not present

## 2017-06-08 DIAGNOSIS — C44219 Basal cell carcinoma of skin of left ear and external auricular canal: Secondary | ICD-10-CM | POA: Diagnosis not present

## 2017-06-08 DIAGNOSIS — C44209 Unspecified malignant neoplasm of skin of left ear and external auricular canal: Secondary | ICD-10-CM | POA: Diagnosis not present

## 2017-06-10 DIAGNOSIS — M62542 Muscle wasting and atrophy, not elsewhere classified, left hand: Secondary | ICD-10-CM | POA: Diagnosis not present

## 2017-06-10 DIAGNOSIS — R339 Retention of urine, unspecified: Secondary | ICD-10-CM | POA: Diagnosis not present

## 2017-06-10 DIAGNOSIS — R2689 Other abnormalities of gait and mobility: Secondary | ICD-10-CM | POA: Diagnosis not present

## 2017-06-10 DIAGNOSIS — F79 Unspecified intellectual disabilities: Secondary | ICD-10-CM | POA: Diagnosis not present

## 2017-06-10 DIAGNOSIS — F259 Schizoaffective disorder, unspecified: Secondary | ICD-10-CM | POA: Diagnosis not present

## 2017-06-10 DIAGNOSIS — N1330 Unspecified hydronephrosis: Secondary | ICD-10-CM | POA: Diagnosis not present

## 2017-06-10 DIAGNOSIS — M62541 Muscle wasting and atrophy, not elsewhere classified, right hand: Secondary | ICD-10-CM | POA: Diagnosis not present

## 2017-06-14 DIAGNOSIS — Z743 Need for continuous supervision: Secondary | ICD-10-CM | POA: Diagnosis not present

## 2017-06-14 DIAGNOSIS — R0602 Shortness of breath: Secondary | ICD-10-CM | POA: Diagnosis not present

## 2017-06-14 DIAGNOSIS — T18128A Food in esophagus causing other injury, initial encounter: Secondary | ICD-10-CM | POA: Diagnosis not present

## 2017-06-14 DIAGNOSIS — I10 Essential (primary) hypertension: Secondary | ICD-10-CM | POA: Diagnosis not present

## 2017-06-14 DIAGNOSIS — E119 Type 2 diabetes mellitus without complications: Secondary | ICD-10-CM | POA: Diagnosis not present

## 2017-06-14 DIAGNOSIS — Z7982 Long term (current) use of aspirin: Secondary | ICD-10-CM | POA: Diagnosis not present

## 2017-06-14 DIAGNOSIS — Z79899 Other long term (current) drug therapy: Secondary | ICD-10-CM | POA: Diagnosis not present

## 2017-06-14 DIAGNOSIS — R279 Unspecified lack of coordination: Secondary | ICD-10-CM | POA: Diagnosis not present

## 2017-06-14 DIAGNOSIS — T17300A Unspecified foreign body in larynx causing asphyxiation, initial encounter: Secondary | ICD-10-CM | POA: Diagnosis not present

## 2017-06-14 DIAGNOSIS — T18108A Unspecified foreign body in esophagus causing other injury, initial encounter: Secondary | ICD-10-CM | POA: Diagnosis not present

## 2017-06-23 DIAGNOSIS — R339 Retention of urine, unspecified: Secondary | ICD-10-CM | POA: Diagnosis not present

## 2017-06-23 DIAGNOSIS — N471 Phimosis: Secondary | ICD-10-CM | POA: Diagnosis not present

## 2017-06-23 DIAGNOSIS — I129 Hypertensive chronic kidney disease with stage 1 through stage 4 chronic kidney disease, or unspecified chronic kidney disease: Secondary | ICD-10-CM | POA: Diagnosis not present

## 2017-06-23 DIAGNOSIS — N390 Urinary tract infection, site not specified: Secondary | ICD-10-CM | POA: Diagnosis not present

## 2017-06-23 DIAGNOSIS — G1221 Amyotrophic lateral sclerosis: Secondary | ICD-10-CM | POA: Diagnosis not present

## 2017-06-23 DIAGNOSIS — L89312 Pressure ulcer of right buttock, stage 2: Secondary | ICD-10-CM | POA: Diagnosis not present

## 2017-06-27 DIAGNOSIS — C44219 Basal cell carcinoma of skin of left ear and external auricular canal: Secondary | ICD-10-CM | POA: Diagnosis not present

## 2017-06-27 DIAGNOSIS — C44212 Basal cell carcinoma of skin of right ear and external auricular canal: Secondary | ICD-10-CM | POA: Diagnosis not present

## 2017-06-27 DIAGNOSIS — C44209 Unspecified malignant neoplasm of skin of left ear and external auricular canal: Secondary | ICD-10-CM | POA: Diagnosis not present

## 2017-06-27 DIAGNOSIS — D2322 Other benign neoplasm of skin of left ear and external auricular canal: Secondary | ICD-10-CM | POA: Diagnosis not present

## 2017-06-28 DIAGNOSIS — R339 Retention of urine, unspecified: Secondary | ICD-10-CM | POA: Diagnosis not present

## 2017-06-28 DIAGNOSIS — G1221 Amyotrophic lateral sclerosis: Secondary | ICD-10-CM | POA: Diagnosis not present

## 2017-06-28 DIAGNOSIS — L89312 Pressure ulcer of right buttock, stage 2: Secondary | ICD-10-CM | POA: Diagnosis not present

## 2017-06-28 DIAGNOSIS — N471 Phimosis: Secondary | ICD-10-CM | POA: Diagnosis not present

## 2017-06-28 DIAGNOSIS — N390 Urinary tract infection, site not specified: Secondary | ICD-10-CM | POA: Diagnosis not present

## 2017-06-28 DIAGNOSIS — I129 Hypertensive chronic kidney disease with stage 1 through stage 4 chronic kidney disease, or unspecified chronic kidney disease: Secondary | ICD-10-CM | POA: Diagnosis not present

## 2017-06-28 DIAGNOSIS — C44212 Basal cell carcinoma of skin of right ear and external auricular canal: Secondary | ICD-10-CM | POA: Diagnosis not present

## 2017-06-28 DIAGNOSIS — C44209 Unspecified malignant neoplasm of skin of left ear and external auricular canal: Secondary | ICD-10-CM | POA: Diagnosis not present

## 2017-06-30 DIAGNOSIS — G1221 Amyotrophic lateral sclerosis: Secondary | ICD-10-CM | POA: Diagnosis not present

## 2017-06-30 DIAGNOSIS — N471 Phimosis: Secondary | ICD-10-CM | POA: Diagnosis not present

## 2017-06-30 DIAGNOSIS — R339 Retention of urine, unspecified: Secondary | ICD-10-CM | POA: Diagnosis not present

## 2017-06-30 DIAGNOSIS — I129 Hypertensive chronic kidney disease with stage 1 through stage 4 chronic kidney disease, or unspecified chronic kidney disease: Secondary | ICD-10-CM | POA: Diagnosis not present

## 2017-06-30 DIAGNOSIS — L89312 Pressure ulcer of right buttock, stage 2: Secondary | ICD-10-CM | POA: Diagnosis not present

## 2017-06-30 DIAGNOSIS — N390 Urinary tract infection, site not specified: Secondary | ICD-10-CM | POA: Diagnosis not present

## 2017-07-01 DIAGNOSIS — N390 Urinary tract infection, site not specified: Secondary | ICD-10-CM | POA: Diagnosis not present

## 2017-07-01 DIAGNOSIS — I129 Hypertensive chronic kidney disease with stage 1 through stage 4 chronic kidney disease, or unspecified chronic kidney disease: Secondary | ICD-10-CM | POA: Diagnosis not present

## 2017-07-01 DIAGNOSIS — R339 Retention of urine, unspecified: Secondary | ICD-10-CM | POA: Diagnosis not present

## 2017-07-01 DIAGNOSIS — G1221 Amyotrophic lateral sclerosis: Secondary | ICD-10-CM | POA: Diagnosis not present

## 2017-07-01 DIAGNOSIS — L89312 Pressure ulcer of right buttock, stage 2: Secondary | ICD-10-CM | POA: Diagnosis not present

## 2017-07-01 DIAGNOSIS — N471 Phimosis: Secondary | ICD-10-CM | POA: Diagnosis not present

## 2017-07-05 DIAGNOSIS — N471 Phimosis: Secondary | ICD-10-CM | POA: Diagnosis not present

## 2017-07-05 DIAGNOSIS — R339 Retention of urine, unspecified: Secondary | ICD-10-CM | POA: Diagnosis not present

## 2017-07-05 DIAGNOSIS — I129 Hypertensive chronic kidney disease with stage 1 through stage 4 chronic kidney disease, or unspecified chronic kidney disease: Secondary | ICD-10-CM | POA: Diagnosis not present

## 2017-07-05 DIAGNOSIS — L89312 Pressure ulcer of right buttock, stage 2: Secondary | ICD-10-CM | POA: Diagnosis not present

## 2017-07-05 DIAGNOSIS — G1221 Amyotrophic lateral sclerosis: Secondary | ICD-10-CM | POA: Diagnosis not present

## 2017-07-05 DIAGNOSIS — N390 Urinary tract infection, site not specified: Secondary | ICD-10-CM | POA: Diagnosis not present

## 2017-07-07 DIAGNOSIS — R339 Retention of urine, unspecified: Secondary | ICD-10-CM | POA: Diagnosis not present

## 2017-07-07 DIAGNOSIS — N390 Urinary tract infection, site not specified: Secondary | ICD-10-CM | POA: Diagnosis not present

## 2017-07-07 DIAGNOSIS — G1221 Amyotrophic lateral sclerosis: Secondary | ICD-10-CM | POA: Diagnosis not present

## 2017-07-07 DIAGNOSIS — I129 Hypertensive chronic kidney disease with stage 1 through stage 4 chronic kidney disease, or unspecified chronic kidney disease: Secondary | ICD-10-CM | POA: Diagnosis not present

## 2017-07-07 DIAGNOSIS — N471 Phimosis: Secondary | ICD-10-CM | POA: Diagnosis not present

## 2017-07-07 DIAGNOSIS — L89312 Pressure ulcer of right buttock, stage 2: Secondary | ICD-10-CM | POA: Diagnosis not present

## 2017-07-08 DIAGNOSIS — C44209 Unspecified malignant neoplasm of skin of left ear and external auricular canal: Secondary | ICD-10-CM | POA: Diagnosis not present

## 2017-07-08 DIAGNOSIS — N471 Phimosis: Secondary | ICD-10-CM | POA: Diagnosis not present

## 2017-07-08 DIAGNOSIS — L89312 Pressure ulcer of right buttock, stage 2: Secondary | ICD-10-CM | POA: Diagnosis not present

## 2017-07-08 DIAGNOSIS — I129 Hypertensive chronic kidney disease with stage 1 through stage 4 chronic kidney disease, or unspecified chronic kidney disease: Secondary | ICD-10-CM | POA: Diagnosis not present

## 2017-07-08 DIAGNOSIS — R339 Retention of urine, unspecified: Secondary | ICD-10-CM | POA: Diagnosis not present

## 2017-07-08 DIAGNOSIS — G1221 Amyotrophic lateral sclerosis: Secondary | ICD-10-CM | POA: Diagnosis not present

## 2017-07-08 DIAGNOSIS — H6123 Impacted cerumen, bilateral: Secondary | ICD-10-CM | POA: Diagnosis not present

## 2017-07-08 DIAGNOSIS — N390 Urinary tract infection, site not specified: Secondary | ICD-10-CM | POA: Diagnosis not present

## 2017-07-12 DIAGNOSIS — I129 Hypertensive chronic kidney disease with stage 1 through stage 4 chronic kidney disease, or unspecified chronic kidney disease: Secondary | ICD-10-CM | POA: Diagnosis not present

## 2017-07-12 DIAGNOSIS — R339 Retention of urine, unspecified: Secondary | ICD-10-CM | POA: Diagnosis not present

## 2017-07-12 DIAGNOSIS — N471 Phimosis: Secondary | ICD-10-CM | POA: Diagnosis not present

## 2017-07-12 DIAGNOSIS — L89312 Pressure ulcer of right buttock, stage 2: Secondary | ICD-10-CM | POA: Diagnosis not present

## 2017-07-12 DIAGNOSIS — G1221 Amyotrophic lateral sclerosis: Secondary | ICD-10-CM | POA: Diagnosis not present

## 2017-07-12 DIAGNOSIS — N390 Urinary tract infection, site not specified: Secondary | ICD-10-CM | POA: Diagnosis not present

## 2017-07-14 DIAGNOSIS — N390 Urinary tract infection, site not specified: Secondary | ICD-10-CM | POA: Diagnosis not present

## 2017-07-14 DIAGNOSIS — L89312 Pressure ulcer of right buttock, stage 2: Secondary | ICD-10-CM | POA: Diagnosis not present

## 2017-07-14 DIAGNOSIS — G1221 Amyotrophic lateral sclerosis: Secondary | ICD-10-CM | POA: Diagnosis not present

## 2017-07-14 DIAGNOSIS — N471 Phimosis: Secondary | ICD-10-CM | POA: Diagnosis not present

## 2017-07-14 DIAGNOSIS — R339 Retention of urine, unspecified: Secondary | ICD-10-CM | POA: Diagnosis not present

## 2017-07-14 DIAGNOSIS — I129 Hypertensive chronic kidney disease with stage 1 through stage 4 chronic kidney disease, or unspecified chronic kidney disease: Secondary | ICD-10-CM | POA: Diagnosis not present

## 2017-07-15 DIAGNOSIS — G1221 Amyotrophic lateral sclerosis: Secondary | ICD-10-CM | POA: Diagnosis not present

## 2017-07-15 DIAGNOSIS — N390 Urinary tract infection, site not specified: Secondary | ICD-10-CM | POA: Diagnosis not present

## 2017-07-15 DIAGNOSIS — N471 Phimosis: Secondary | ICD-10-CM | POA: Diagnosis not present

## 2017-07-15 DIAGNOSIS — I129 Hypertensive chronic kidney disease with stage 1 through stage 4 chronic kidney disease, or unspecified chronic kidney disease: Secondary | ICD-10-CM | POA: Diagnosis not present

## 2017-07-15 DIAGNOSIS — L89312 Pressure ulcer of right buttock, stage 2: Secondary | ICD-10-CM | POA: Diagnosis not present

## 2017-07-15 DIAGNOSIS — R339 Retention of urine, unspecified: Secondary | ICD-10-CM | POA: Diagnosis not present

## 2017-07-20 DIAGNOSIS — G1221 Amyotrophic lateral sclerosis: Secondary | ICD-10-CM | POA: Diagnosis not present

## 2017-07-20 DIAGNOSIS — R339 Retention of urine, unspecified: Secondary | ICD-10-CM | POA: Diagnosis not present

## 2017-07-20 DIAGNOSIS — L89312 Pressure ulcer of right buttock, stage 2: Secondary | ICD-10-CM | POA: Diagnosis not present

## 2017-07-20 DIAGNOSIS — N390 Urinary tract infection, site not specified: Secondary | ICD-10-CM | POA: Diagnosis not present

## 2017-07-20 DIAGNOSIS — I129 Hypertensive chronic kidney disease with stage 1 through stage 4 chronic kidney disease, or unspecified chronic kidney disease: Secondary | ICD-10-CM | POA: Diagnosis not present

## 2017-07-20 DIAGNOSIS — N471 Phimosis: Secondary | ICD-10-CM | POA: Diagnosis not present

## 2017-07-25 DIAGNOSIS — R339 Retention of urine, unspecified: Secondary | ICD-10-CM | POA: Diagnosis not present

## 2017-07-25 DIAGNOSIS — I129 Hypertensive chronic kidney disease with stage 1 through stage 4 chronic kidney disease, or unspecified chronic kidney disease: Secondary | ICD-10-CM | POA: Diagnosis not present

## 2017-07-25 DIAGNOSIS — G1221 Amyotrophic lateral sclerosis: Secondary | ICD-10-CM | POA: Diagnosis not present

## 2017-07-25 DIAGNOSIS — N471 Phimosis: Secondary | ICD-10-CM | POA: Diagnosis not present

## 2017-07-25 DIAGNOSIS — N390 Urinary tract infection, site not specified: Secondary | ICD-10-CM | POA: Diagnosis not present

## 2017-07-25 DIAGNOSIS — L89312 Pressure ulcer of right buttock, stage 2: Secondary | ICD-10-CM | POA: Diagnosis not present

## 2017-07-26 DIAGNOSIS — L89312 Pressure ulcer of right buttock, stage 2: Secondary | ICD-10-CM | POA: Diagnosis not present

## 2017-07-26 DIAGNOSIS — N390 Urinary tract infection, site not specified: Secondary | ICD-10-CM | POA: Diagnosis not present

## 2017-07-26 DIAGNOSIS — G1221 Amyotrophic lateral sclerosis: Secondary | ICD-10-CM | POA: Diagnosis not present

## 2017-07-26 DIAGNOSIS — N471 Phimosis: Secondary | ICD-10-CM | POA: Diagnosis not present

## 2017-07-26 DIAGNOSIS — I129 Hypertensive chronic kidney disease with stage 1 through stage 4 chronic kidney disease, or unspecified chronic kidney disease: Secondary | ICD-10-CM | POA: Diagnosis not present

## 2017-07-26 DIAGNOSIS — R339 Retention of urine, unspecified: Secondary | ICD-10-CM | POA: Diagnosis not present

## 2017-07-27 DIAGNOSIS — N390 Urinary tract infection, site not specified: Secondary | ICD-10-CM | POA: Diagnosis not present

## 2017-07-27 DIAGNOSIS — L89312 Pressure ulcer of right buttock, stage 2: Secondary | ICD-10-CM | POA: Diagnosis not present

## 2017-07-27 DIAGNOSIS — R339 Retention of urine, unspecified: Secondary | ICD-10-CM | POA: Diagnosis not present

## 2017-07-27 DIAGNOSIS — I129 Hypertensive chronic kidney disease with stage 1 through stage 4 chronic kidney disease, or unspecified chronic kidney disease: Secondary | ICD-10-CM | POA: Diagnosis not present

## 2017-07-27 DIAGNOSIS — G1221 Amyotrophic lateral sclerosis: Secondary | ICD-10-CM | POA: Diagnosis not present

## 2017-07-27 DIAGNOSIS — N471 Phimosis: Secondary | ICD-10-CM | POA: Diagnosis not present

## 2017-07-28 DIAGNOSIS — F79 Unspecified intellectual disabilities: Secondary | ICD-10-CM | POA: Diagnosis not present

## 2017-07-28 DIAGNOSIS — R339 Retention of urine, unspecified: Secondary | ICD-10-CM | POA: Diagnosis not present

## 2017-07-28 DIAGNOSIS — F0390 Unspecified dementia without behavioral disturbance: Secondary | ICD-10-CM | POA: Diagnosis not present

## 2017-07-28 DIAGNOSIS — E039 Hypothyroidism, unspecified: Secondary | ICD-10-CM | POA: Diagnosis not present

## 2017-07-28 DIAGNOSIS — N471 Phimosis: Secondary | ICD-10-CM | POA: Diagnosis not present

## 2017-07-28 DIAGNOSIS — D649 Anemia, unspecified: Secondary | ICD-10-CM | POA: Diagnosis not present

## 2017-07-28 DIAGNOSIS — K5900 Constipation, unspecified: Secondary | ICD-10-CM | POA: Diagnosis not present

## 2017-07-28 DIAGNOSIS — R5383 Other fatigue: Secondary | ICD-10-CM | POA: Diagnosis not present

## 2017-07-28 DIAGNOSIS — Z66 Do not resuscitate: Secondary | ICD-10-CM | POA: Diagnosis not present

## 2017-07-28 DIAGNOSIS — R338 Other retention of urine: Secondary | ICD-10-CM | POA: Diagnosis not present

## 2017-07-28 DIAGNOSIS — M2550 Pain in unspecified joint: Secondary | ICD-10-CM | POA: Diagnosis not present

## 2017-07-28 DIAGNOSIS — E139 Other specified diabetes mellitus without complications: Secondary | ICD-10-CM | POA: Diagnosis not present

## 2017-07-28 DIAGNOSIS — N183 Chronic kidney disease, stage 3 (moderate): Secondary | ICD-10-CM | POA: Diagnosis not present

## 2017-07-28 DIAGNOSIS — L89312 Pressure ulcer of right buttock, stage 2: Secondary | ICD-10-CM | POA: Diagnosis not present

## 2017-07-28 DIAGNOSIS — F209 Schizophrenia, unspecified: Secondary | ICD-10-CM | POA: Diagnosis not present

## 2017-07-28 DIAGNOSIS — G1221 Amyotrophic lateral sclerosis: Secondary | ICD-10-CM | POA: Diagnosis not present

## 2017-07-28 DIAGNOSIS — N390 Urinary tract infection, site not specified: Secondary | ICD-10-CM | POA: Diagnosis not present

## 2017-07-28 DIAGNOSIS — I129 Hypertensive chronic kidney disease with stage 1 through stage 4 chronic kidney disease, or unspecified chronic kidney disease: Secondary | ICD-10-CM | POA: Diagnosis not present

## 2017-08-01 DIAGNOSIS — I129 Hypertensive chronic kidney disease with stage 1 through stage 4 chronic kidney disease, or unspecified chronic kidney disease: Secondary | ICD-10-CM | POA: Diagnosis not present

## 2017-08-01 DIAGNOSIS — R339 Retention of urine, unspecified: Secondary | ICD-10-CM | POA: Diagnosis not present

## 2017-08-01 DIAGNOSIS — N471 Phimosis: Secondary | ICD-10-CM | POA: Diagnosis not present

## 2017-08-01 DIAGNOSIS — N390 Urinary tract infection, site not specified: Secondary | ICD-10-CM | POA: Diagnosis not present

## 2017-08-01 DIAGNOSIS — G1221 Amyotrophic lateral sclerosis: Secondary | ICD-10-CM | POA: Diagnosis not present

## 2017-08-01 DIAGNOSIS — L89312 Pressure ulcer of right buttock, stage 2: Secondary | ICD-10-CM | POA: Diagnosis not present

## 2017-08-02 DIAGNOSIS — G1221 Amyotrophic lateral sclerosis: Secondary | ICD-10-CM | POA: Diagnosis not present

## 2017-08-02 DIAGNOSIS — L89312 Pressure ulcer of right buttock, stage 2: Secondary | ICD-10-CM | POA: Diagnosis not present

## 2017-08-02 DIAGNOSIS — N471 Phimosis: Secondary | ICD-10-CM | POA: Diagnosis not present

## 2017-08-02 DIAGNOSIS — I129 Hypertensive chronic kidney disease with stage 1 through stage 4 chronic kidney disease, or unspecified chronic kidney disease: Secondary | ICD-10-CM | POA: Diagnosis not present

## 2017-08-02 DIAGNOSIS — N390 Urinary tract infection, site not specified: Secondary | ICD-10-CM | POA: Diagnosis not present

## 2017-08-02 DIAGNOSIS — R339 Retention of urine, unspecified: Secondary | ICD-10-CM | POA: Diagnosis not present

## 2017-08-03 DIAGNOSIS — N184 Chronic kidney disease, stage 4 (severe): Secondary | ICD-10-CM | POA: Diagnosis not present

## 2017-08-03 DIAGNOSIS — K5641 Fecal impaction: Secondary | ICD-10-CM | POA: Diagnosis not present

## 2017-08-03 DIAGNOSIS — R251 Tremor, unspecified: Secondary | ICD-10-CM | POA: Diagnosis not present

## 2017-08-03 DIAGNOSIS — B370 Candidal stomatitis: Secondary | ICD-10-CM | POA: Diagnosis not present

## 2017-08-03 DIAGNOSIS — N1330 Unspecified hydronephrosis: Secondary | ICD-10-CM | POA: Diagnosis not present

## 2017-08-03 DIAGNOSIS — R419 Unspecified symptoms and signs involving cognitive functions and awareness: Secondary | ICD-10-CM | POA: Diagnosis not present

## 2017-08-04 DIAGNOSIS — G1221 Amyotrophic lateral sclerosis: Secondary | ICD-10-CM | POA: Diagnosis not present

## 2017-08-04 DIAGNOSIS — N471 Phimosis: Secondary | ICD-10-CM | POA: Diagnosis not present

## 2017-08-04 DIAGNOSIS — R339 Retention of urine, unspecified: Secondary | ICD-10-CM | POA: Diagnosis not present

## 2017-08-04 DIAGNOSIS — I129 Hypertensive chronic kidney disease with stage 1 through stage 4 chronic kidney disease, or unspecified chronic kidney disease: Secondary | ICD-10-CM | POA: Diagnosis not present

## 2017-08-04 DIAGNOSIS — L89312 Pressure ulcer of right buttock, stage 2: Secondary | ICD-10-CM | POA: Diagnosis not present

## 2017-08-04 DIAGNOSIS — N390 Urinary tract infection, site not specified: Secondary | ICD-10-CM | POA: Diagnosis not present

## 2017-08-08 DIAGNOSIS — K5900 Constipation, unspecified: Secondary | ICD-10-CM | POA: Diagnosis not present

## 2017-08-08 DIAGNOSIS — K5641 Fecal impaction: Secondary | ICD-10-CM | POA: Diagnosis not present

## 2017-08-09 DIAGNOSIS — N471 Phimosis: Secondary | ICD-10-CM | POA: Diagnosis not present

## 2017-08-09 DIAGNOSIS — G1221 Amyotrophic lateral sclerosis: Secondary | ICD-10-CM | POA: Diagnosis not present

## 2017-08-09 DIAGNOSIS — R339 Retention of urine, unspecified: Secondary | ICD-10-CM | POA: Diagnosis not present

## 2017-08-09 DIAGNOSIS — I129 Hypertensive chronic kidney disease with stage 1 through stage 4 chronic kidney disease, or unspecified chronic kidney disease: Secondary | ICD-10-CM | POA: Diagnosis not present

## 2017-08-09 DIAGNOSIS — L89312 Pressure ulcer of right buttock, stage 2: Secondary | ICD-10-CM | POA: Diagnosis not present

## 2017-08-09 DIAGNOSIS — N390 Urinary tract infection, site not specified: Secondary | ICD-10-CM | POA: Diagnosis not present

## 2017-08-10 DIAGNOSIS — R339 Retention of urine, unspecified: Secondary | ICD-10-CM | POA: Diagnosis not present

## 2017-08-10 DIAGNOSIS — N390 Urinary tract infection, site not specified: Secondary | ICD-10-CM | POA: Diagnosis not present

## 2017-08-10 DIAGNOSIS — I129 Hypertensive chronic kidney disease with stage 1 through stage 4 chronic kidney disease, or unspecified chronic kidney disease: Secondary | ICD-10-CM | POA: Diagnosis not present

## 2017-08-10 DIAGNOSIS — L89312 Pressure ulcer of right buttock, stage 2: Secondary | ICD-10-CM | POA: Diagnosis not present

## 2017-08-10 DIAGNOSIS — G1221 Amyotrophic lateral sclerosis: Secondary | ICD-10-CM | POA: Diagnosis not present

## 2017-08-10 DIAGNOSIS — N471 Phimosis: Secondary | ICD-10-CM | POA: Diagnosis not present

## 2017-08-11 DIAGNOSIS — R339 Retention of urine, unspecified: Secondary | ICD-10-CM | POA: Diagnosis not present

## 2017-08-11 DIAGNOSIS — L89312 Pressure ulcer of right buttock, stage 2: Secondary | ICD-10-CM | POA: Diagnosis not present

## 2017-08-11 DIAGNOSIS — I129 Hypertensive chronic kidney disease with stage 1 through stage 4 chronic kidney disease, or unspecified chronic kidney disease: Secondary | ICD-10-CM | POA: Diagnosis not present

## 2017-08-11 DIAGNOSIS — G1221 Amyotrophic lateral sclerosis: Secondary | ICD-10-CM | POA: Diagnosis not present

## 2017-08-11 DIAGNOSIS — N471 Phimosis: Secondary | ICD-10-CM | POA: Diagnosis not present

## 2017-08-11 DIAGNOSIS — N390 Urinary tract infection, site not specified: Secondary | ICD-10-CM | POA: Diagnosis not present

## 2017-08-15 DIAGNOSIS — L89312 Pressure ulcer of right buttock, stage 2: Secondary | ICD-10-CM | POA: Diagnosis not present

## 2017-08-15 DIAGNOSIS — R339 Retention of urine, unspecified: Secondary | ICD-10-CM | POA: Diagnosis not present

## 2017-08-15 DIAGNOSIS — G1221 Amyotrophic lateral sclerosis: Secondary | ICD-10-CM | POA: Diagnosis not present

## 2017-08-15 DIAGNOSIS — N471 Phimosis: Secondary | ICD-10-CM | POA: Diagnosis not present

## 2017-08-15 DIAGNOSIS — I129 Hypertensive chronic kidney disease with stage 1 through stage 4 chronic kidney disease, or unspecified chronic kidney disease: Secondary | ICD-10-CM | POA: Diagnosis not present

## 2017-08-15 DIAGNOSIS — N390 Urinary tract infection, site not specified: Secondary | ICD-10-CM | POA: Diagnosis not present

## 2017-08-17 DIAGNOSIS — G1221 Amyotrophic lateral sclerosis: Secondary | ICD-10-CM | POA: Diagnosis not present

## 2017-08-17 DIAGNOSIS — N390 Urinary tract infection, site not specified: Secondary | ICD-10-CM | POA: Diagnosis not present

## 2017-08-17 DIAGNOSIS — N471 Phimosis: Secondary | ICD-10-CM | POA: Diagnosis not present

## 2017-08-17 DIAGNOSIS — I129 Hypertensive chronic kidney disease with stage 1 through stage 4 chronic kidney disease, or unspecified chronic kidney disease: Secondary | ICD-10-CM | POA: Diagnosis not present

## 2017-08-17 DIAGNOSIS — R339 Retention of urine, unspecified: Secondary | ICD-10-CM | POA: Diagnosis not present

## 2017-08-17 DIAGNOSIS — L89312 Pressure ulcer of right buttock, stage 2: Secondary | ICD-10-CM | POA: Diagnosis not present

## 2017-08-18 DIAGNOSIS — L89312 Pressure ulcer of right buttock, stage 2: Secondary | ICD-10-CM | POA: Diagnosis not present

## 2017-08-18 DIAGNOSIS — G1221 Amyotrophic lateral sclerosis: Secondary | ICD-10-CM | POA: Diagnosis not present

## 2017-08-18 DIAGNOSIS — N390 Urinary tract infection, site not specified: Secondary | ICD-10-CM | POA: Diagnosis not present

## 2017-08-18 DIAGNOSIS — N471 Phimosis: Secondary | ICD-10-CM | POA: Diagnosis not present

## 2017-08-18 DIAGNOSIS — R339 Retention of urine, unspecified: Secondary | ICD-10-CM | POA: Diagnosis not present

## 2017-08-18 DIAGNOSIS — I129 Hypertensive chronic kidney disease with stage 1 through stage 4 chronic kidney disease, or unspecified chronic kidney disease: Secondary | ICD-10-CM | POA: Diagnosis not present

## 2017-08-22 DIAGNOSIS — L89152 Pressure ulcer of sacral region, stage 2: Secondary | ICD-10-CM | POA: Diagnosis not present

## 2017-08-22 DIAGNOSIS — E1122 Type 2 diabetes mellitus with diabetic chronic kidney disease: Secondary | ICD-10-CM | POA: Diagnosis not present

## 2017-08-22 DIAGNOSIS — G1221 Amyotrophic lateral sclerosis: Secondary | ICD-10-CM | POA: Diagnosis not present

## 2017-08-22 DIAGNOSIS — Z466 Encounter for fitting and adjustment of urinary device: Secondary | ICD-10-CM | POA: Diagnosis not present

## 2017-08-22 DIAGNOSIS — N184 Chronic kidney disease, stage 4 (severe): Secondary | ICD-10-CM | POA: Diagnosis not present

## 2017-08-22 DIAGNOSIS — I129 Hypertensive chronic kidney disease with stage 1 through stage 4 chronic kidney disease, or unspecified chronic kidney disease: Secondary | ICD-10-CM | POA: Diagnosis not present

## 2017-08-23 DIAGNOSIS — G1221 Amyotrophic lateral sclerosis: Secondary | ICD-10-CM | POA: Diagnosis not present

## 2017-08-23 DIAGNOSIS — Z466 Encounter for fitting and adjustment of urinary device: Secondary | ICD-10-CM | POA: Diagnosis not present

## 2017-08-23 DIAGNOSIS — E1122 Type 2 diabetes mellitus with diabetic chronic kidney disease: Secondary | ICD-10-CM | POA: Diagnosis not present

## 2017-08-23 DIAGNOSIS — I129 Hypertensive chronic kidney disease with stage 1 through stage 4 chronic kidney disease, or unspecified chronic kidney disease: Secondary | ICD-10-CM | POA: Diagnosis not present

## 2017-08-23 DIAGNOSIS — L89152 Pressure ulcer of sacral region, stage 2: Secondary | ICD-10-CM | POA: Diagnosis not present

## 2017-08-23 DIAGNOSIS — N184 Chronic kidney disease, stage 4 (severe): Secondary | ICD-10-CM | POA: Diagnosis not present

## 2017-08-24 DIAGNOSIS — N184 Chronic kidney disease, stage 4 (severe): Secondary | ICD-10-CM | POA: Diagnosis not present

## 2017-08-24 DIAGNOSIS — E1122 Type 2 diabetes mellitus with diabetic chronic kidney disease: Secondary | ICD-10-CM | POA: Diagnosis not present

## 2017-08-24 DIAGNOSIS — G1221 Amyotrophic lateral sclerosis: Secondary | ICD-10-CM | POA: Diagnosis not present

## 2017-08-24 DIAGNOSIS — I129 Hypertensive chronic kidney disease with stage 1 through stage 4 chronic kidney disease, or unspecified chronic kidney disease: Secondary | ICD-10-CM | POA: Diagnosis not present

## 2017-08-24 DIAGNOSIS — L89152 Pressure ulcer of sacral region, stage 2: Secondary | ICD-10-CM | POA: Diagnosis not present

## 2017-08-24 DIAGNOSIS — Z466 Encounter for fitting and adjustment of urinary device: Secondary | ICD-10-CM | POA: Diagnosis not present

## 2017-08-25 DIAGNOSIS — K5900 Constipation, unspecified: Secondary | ICD-10-CM | POA: Diagnosis not present

## 2017-08-25 DIAGNOSIS — R933 Abnormal findings on diagnostic imaging of other parts of digestive tract: Secondary | ICD-10-CM | POA: Diagnosis not present

## 2017-08-26 DIAGNOSIS — E1122 Type 2 diabetes mellitus with diabetic chronic kidney disease: Secondary | ICD-10-CM | POA: Diagnosis not present

## 2017-08-26 DIAGNOSIS — L89152 Pressure ulcer of sacral region, stage 2: Secondary | ICD-10-CM | POA: Diagnosis not present

## 2017-08-26 DIAGNOSIS — I129 Hypertensive chronic kidney disease with stage 1 through stage 4 chronic kidney disease, or unspecified chronic kidney disease: Secondary | ICD-10-CM | POA: Diagnosis not present

## 2017-08-26 DIAGNOSIS — G1221 Amyotrophic lateral sclerosis: Secondary | ICD-10-CM | POA: Diagnosis not present

## 2017-08-26 DIAGNOSIS — Z466 Encounter for fitting and adjustment of urinary device: Secondary | ICD-10-CM | POA: Diagnosis not present

## 2017-08-26 DIAGNOSIS — N184 Chronic kidney disease, stage 4 (severe): Secondary | ICD-10-CM | POA: Diagnosis not present

## 2017-08-30 DIAGNOSIS — Z466 Encounter for fitting and adjustment of urinary device: Secondary | ICD-10-CM | POA: Diagnosis not present

## 2017-08-30 DIAGNOSIS — N184 Chronic kidney disease, stage 4 (severe): Secondary | ICD-10-CM | POA: Diagnosis not present

## 2017-08-30 DIAGNOSIS — G1221 Amyotrophic lateral sclerosis: Secondary | ICD-10-CM | POA: Diagnosis not present

## 2017-08-30 DIAGNOSIS — I129 Hypertensive chronic kidney disease with stage 1 through stage 4 chronic kidney disease, or unspecified chronic kidney disease: Secondary | ICD-10-CM | POA: Diagnosis not present

## 2017-08-30 DIAGNOSIS — L89152 Pressure ulcer of sacral region, stage 2: Secondary | ICD-10-CM | POA: Diagnosis not present

## 2017-08-30 DIAGNOSIS — E1122 Type 2 diabetes mellitus with diabetic chronic kidney disease: Secondary | ICD-10-CM | POA: Diagnosis not present

## 2017-08-31 DIAGNOSIS — B370 Candidal stomatitis: Secondary | ICD-10-CM | POA: Diagnosis not present

## 2017-08-31 DIAGNOSIS — K5641 Fecal impaction: Secondary | ICD-10-CM | POA: Diagnosis not present

## 2017-09-01 DIAGNOSIS — G1221 Amyotrophic lateral sclerosis: Secondary | ICD-10-CM | POA: Diagnosis not present

## 2017-09-01 DIAGNOSIS — L89152 Pressure ulcer of sacral region, stage 2: Secondary | ICD-10-CM | POA: Diagnosis not present

## 2017-09-01 DIAGNOSIS — I129 Hypertensive chronic kidney disease with stage 1 through stage 4 chronic kidney disease, or unspecified chronic kidney disease: Secondary | ICD-10-CM | POA: Diagnosis not present

## 2017-09-01 DIAGNOSIS — Z466 Encounter for fitting and adjustment of urinary device: Secondary | ICD-10-CM | POA: Diagnosis not present

## 2017-09-01 DIAGNOSIS — N184 Chronic kidney disease, stage 4 (severe): Secondary | ICD-10-CM | POA: Diagnosis not present

## 2017-09-01 DIAGNOSIS — E1122 Type 2 diabetes mellitus with diabetic chronic kidney disease: Secondary | ICD-10-CM | POA: Diagnosis not present

## 2017-09-02 DIAGNOSIS — Z466 Encounter for fitting and adjustment of urinary device: Secondary | ICD-10-CM | POA: Diagnosis not present

## 2017-09-02 DIAGNOSIS — E1122 Type 2 diabetes mellitus with diabetic chronic kidney disease: Secondary | ICD-10-CM | POA: Diagnosis not present

## 2017-09-02 DIAGNOSIS — G1221 Amyotrophic lateral sclerosis: Secondary | ICD-10-CM | POA: Diagnosis not present

## 2017-09-02 DIAGNOSIS — I129 Hypertensive chronic kidney disease with stage 1 through stage 4 chronic kidney disease, or unspecified chronic kidney disease: Secondary | ICD-10-CM | POA: Diagnosis not present

## 2017-09-02 DIAGNOSIS — N184 Chronic kidney disease, stage 4 (severe): Secondary | ICD-10-CM | POA: Diagnosis not present

## 2017-09-02 DIAGNOSIS — L89152 Pressure ulcer of sacral region, stage 2: Secondary | ICD-10-CM | POA: Diagnosis not present

## 2017-09-05 DIAGNOSIS — L89152 Pressure ulcer of sacral region, stage 2: Secondary | ICD-10-CM | POA: Diagnosis not present

## 2017-09-05 DIAGNOSIS — N184 Chronic kidney disease, stage 4 (severe): Secondary | ICD-10-CM | POA: Diagnosis not present

## 2017-09-05 DIAGNOSIS — I129 Hypertensive chronic kidney disease with stage 1 through stage 4 chronic kidney disease, or unspecified chronic kidney disease: Secondary | ICD-10-CM | POA: Diagnosis not present

## 2017-09-05 DIAGNOSIS — E1122 Type 2 diabetes mellitus with diabetic chronic kidney disease: Secondary | ICD-10-CM | POA: Diagnosis not present

## 2017-09-05 DIAGNOSIS — G1221 Amyotrophic lateral sclerosis: Secondary | ICD-10-CM | POA: Diagnosis not present

## 2017-09-05 DIAGNOSIS — Z466 Encounter for fitting and adjustment of urinary device: Secondary | ICD-10-CM | POA: Diagnosis not present

## 2017-09-07 DIAGNOSIS — Z466 Encounter for fitting and adjustment of urinary device: Secondary | ICD-10-CM | POA: Diagnosis not present

## 2017-09-07 DIAGNOSIS — I129 Hypertensive chronic kidney disease with stage 1 through stage 4 chronic kidney disease, or unspecified chronic kidney disease: Secondary | ICD-10-CM | POA: Diagnosis not present

## 2017-09-07 DIAGNOSIS — E1122 Type 2 diabetes mellitus with diabetic chronic kidney disease: Secondary | ICD-10-CM | POA: Diagnosis not present

## 2017-09-07 DIAGNOSIS — L89152 Pressure ulcer of sacral region, stage 2: Secondary | ICD-10-CM | POA: Diagnosis not present

## 2017-09-07 DIAGNOSIS — G1221 Amyotrophic lateral sclerosis: Secondary | ICD-10-CM | POA: Diagnosis not present

## 2017-09-07 DIAGNOSIS — N184 Chronic kidney disease, stage 4 (severe): Secondary | ICD-10-CM | POA: Diagnosis not present

## 2017-09-08 DIAGNOSIS — N184 Chronic kidney disease, stage 4 (severe): Secondary | ICD-10-CM | POA: Diagnosis not present

## 2017-09-08 DIAGNOSIS — Z466 Encounter for fitting and adjustment of urinary device: Secondary | ICD-10-CM | POA: Diagnosis not present

## 2017-09-08 DIAGNOSIS — G1221 Amyotrophic lateral sclerosis: Secondary | ICD-10-CM | POA: Diagnosis not present

## 2017-09-08 DIAGNOSIS — E1122 Type 2 diabetes mellitus with diabetic chronic kidney disease: Secondary | ICD-10-CM | POA: Diagnosis not present

## 2017-09-08 DIAGNOSIS — I129 Hypertensive chronic kidney disease with stage 1 through stage 4 chronic kidney disease, or unspecified chronic kidney disease: Secondary | ICD-10-CM | POA: Diagnosis not present

## 2017-09-08 DIAGNOSIS — L89152 Pressure ulcer of sacral region, stage 2: Secondary | ICD-10-CM | POA: Diagnosis not present

## 2017-09-12 DIAGNOSIS — G1221 Amyotrophic lateral sclerosis: Secondary | ICD-10-CM | POA: Diagnosis not present

## 2017-09-12 DIAGNOSIS — N184 Chronic kidney disease, stage 4 (severe): Secondary | ICD-10-CM | POA: Diagnosis not present

## 2017-09-12 DIAGNOSIS — I129 Hypertensive chronic kidney disease with stage 1 through stage 4 chronic kidney disease, or unspecified chronic kidney disease: Secondary | ICD-10-CM | POA: Diagnosis not present

## 2017-09-12 DIAGNOSIS — L89152 Pressure ulcer of sacral region, stage 2: Secondary | ICD-10-CM | POA: Diagnosis not present

## 2017-09-12 DIAGNOSIS — E1122 Type 2 diabetes mellitus with diabetic chronic kidney disease: Secondary | ICD-10-CM | POA: Diagnosis not present

## 2017-09-12 DIAGNOSIS — Z466 Encounter for fitting and adjustment of urinary device: Secondary | ICD-10-CM | POA: Diagnosis not present

## 2017-09-13 DIAGNOSIS — Z466 Encounter for fitting and adjustment of urinary device: Secondary | ICD-10-CM | POA: Diagnosis not present

## 2017-09-13 DIAGNOSIS — N184 Chronic kidney disease, stage 4 (severe): Secondary | ICD-10-CM | POA: Diagnosis not present

## 2017-09-13 DIAGNOSIS — E1122 Type 2 diabetes mellitus with diabetic chronic kidney disease: Secondary | ICD-10-CM | POA: Diagnosis not present

## 2017-09-13 DIAGNOSIS — L89152 Pressure ulcer of sacral region, stage 2: Secondary | ICD-10-CM | POA: Diagnosis not present

## 2017-09-13 DIAGNOSIS — G1221 Amyotrophic lateral sclerosis: Secondary | ICD-10-CM | POA: Diagnosis not present

## 2017-09-13 DIAGNOSIS — I129 Hypertensive chronic kidney disease with stage 1 through stage 4 chronic kidney disease, or unspecified chronic kidney disease: Secondary | ICD-10-CM | POA: Diagnosis not present

## 2017-09-14 DIAGNOSIS — D649 Anemia, unspecified: Secondary | ICD-10-CM | POA: Diagnosis not present

## 2017-09-14 DIAGNOSIS — B370 Candidal stomatitis: Secondary | ICD-10-CM | POA: Diagnosis not present

## 2017-09-14 DIAGNOSIS — R338 Other retention of urine: Secondary | ICD-10-CM | POA: Diagnosis present

## 2017-09-14 DIAGNOSIS — I493 Ventricular premature depolarization: Secondary | ICD-10-CM | POA: Diagnosis present

## 2017-09-14 DIAGNOSIS — F209 Schizophrenia, unspecified: Secondary | ICD-10-CM | POA: Diagnosis present

## 2017-09-14 DIAGNOSIS — N189 Chronic kidney disease, unspecified: Secondary | ICD-10-CM | POA: Diagnosis not present

## 2017-09-14 DIAGNOSIS — R531 Weakness: Secondary | ICD-10-CM | POA: Diagnosis not present

## 2017-09-14 DIAGNOSIS — E1122 Type 2 diabetes mellitus with diabetic chronic kidney disease: Secondary | ICD-10-CM | POA: Diagnosis not present

## 2017-09-14 DIAGNOSIS — N390 Urinary tract infection, site not specified: Secondary | ICD-10-CM | POA: Diagnosis not present

## 2017-09-14 DIAGNOSIS — R32 Unspecified urinary incontinence: Secondary | ICD-10-CM | POA: Diagnosis present

## 2017-09-14 DIAGNOSIS — Z743 Need for continuous supervision: Secondary | ICD-10-CM | POA: Diagnosis not present

## 2017-09-14 DIAGNOSIS — R935 Abnormal findings on diagnostic imaging of other abdominal regions, including retroperitoneum: Secondary | ICD-10-CM | POA: Diagnosis not present

## 2017-09-14 DIAGNOSIS — N183 Chronic kidney disease, stage 3 (moderate): Secondary | ICD-10-CM | POA: Diagnosis present

## 2017-09-14 DIAGNOSIS — E119 Type 2 diabetes mellitus without complications: Secondary | ICD-10-CM | POA: Diagnosis not present

## 2017-09-14 DIAGNOSIS — D631 Anemia in chronic kidney disease: Secondary | ICD-10-CM | POA: Diagnosis present

## 2017-09-14 DIAGNOSIS — F79 Unspecified intellectual disabilities: Secondary | ICD-10-CM | POA: Diagnosis present

## 2017-09-14 DIAGNOSIS — G1221 Amyotrophic lateral sclerosis: Secondary | ICD-10-CM | POA: Diagnosis not present

## 2017-09-14 DIAGNOSIS — R279 Unspecified lack of coordination: Secondary | ICD-10-CM | POA: Diagnosis not present

## 2017-09-14 DIAGNOSIS — K5909 Other constipation: Secondary | ICD-10-CM | POA: Diagnosis not present

## 2017-09-14 DIAGNOSIS — R402441 Other coma, without documented Glasgow coma scale score, or with partial score reported, in the field [EMT or ambulance]: Secondary | ICD-10-CM | POA: Diagnosis not present

## 2017-09-14 DIAGNOSIS — N471 Phimosis: Secondary | ICD-10-CM | POA: Diagnosis present

## 2017-09-14 DIAGNOSIS — K5641 Fecal impaction: Secondary | ICD-10-CM | POA: Diagnosis not present

## 2017-09-14 DIAGNOSIS — E039 Hypothyroidism, unspecified: Secondary | ICD-10-CM | POA: Diagnosis not present

## 2017-09-14 DIAGNOSIS — N401 Enlarged prostate with lower urinary tract symptoms: Secondary | ICD-10-CM | POA: Diagnosis present

## 2017-09-14 DIAGNOSIS — Z7982 Long term (current) use of aspirin: Secondary | ICD-10-CM | POA: Diagnosis not present

## 2017-09-14 DIAGNOSIS — N179 Acute kidney failure, unspecified: Secondary | ICD-10-CM | POA: Diagnosis not present

## 2017-09-14 DIAGNOSIS — D509 Iron deficiency anemia, unspecified: Secondary | ICD-10-CM | POA: Diagnosis present

## 2017-09-14 DIAGNOSIS — N3001 Acute cystitis with hematuria: Secondary | ICD-10-CM | POA: Diagnosis not present

## 2017-09-14 DIAGNOSIS — T83518A Infection and inflammatory reaction due to other urinary catheter, initial encounter: Secondary | ICD-10-CM | POA: Diagnosis not present

## 2017-09-14 DIAGNOSIS — B9689 Other specified bacterial agents as the cause of diseases classified elsewhere: Secondary | ICD-10-CM | POA: Diagnosis not present

## 2017-09-14 DIAGNOSIS — R2689 Other abnormalities of gait and mobility: Secondary | ICD-10-CM | POA: Diagnosis not present

## 2017-09-20 DIAGNOSIS — B952 Enterococcus as the cause of diseases classified elsewhere: Secondary | ICD-10-CM | POA: Diagnosis not present

## 2017-09-20 DIAGNOSIS — N401 Enlarged prostate with lower urinary tract symptoms: Secondary | ICD-10-CM | POA: Diagnosis not present

## 2017-09-20 DIAGNOSIS — D649 Anemia, unspecified: Secondary | ICD-10-CM | POA: Diagnosis not present

## 2017-09-20 DIAGNOSIS — N390 Urinary tract infection, site not specified: Secondary | ICD-10-CM | POA: Diagnosis not present

## 2017-09-20 DIAGNOSIS — F209 Schizophrenia, unspecified: Secondary | ICD-10-CM | POA: Diagnosis not present

## 2017-09-20 DIAGNOSIS — E119 Type 2 diabetes mellitus without complications: Secondary | ICD-10-CM | POA: Diagnosis not present

## 2017-09-21 DIAGNOSIS — N390 Urinary tract infection, site not specified: Secondary | ICD-10-CM | POA: Diagnosis not present

## 2017-09-21 DIAGNOSIS — N401 Enlarged prostate with lower urinary tract symptoms: Secondary | ICD-10-CM | POA: Diagnosis not present

## 2017-09-21 DIAGNOSIS — D649 Anemia, unspecified: Secondary | ICD-10-CM | POA: Diagnosis not present

## 2017-09-21 DIAGNOSIS — E119 Type 2 diabetes mellitus without complications: Secondary | ICD-10-CM | POA: Diagnosis not present

## 2017-09-21 DIAGNOSIS — B952 Enterococcus as the cause of diseases classified elsewhere: Secondary | ICD-10-CM | POA: Diagnosis not present

## 2017-09-21 DIAGNOSIS — F209 Schizophrenia, unspecified: Secondary | ICD-10-CM | POA: Diagnosis not present

## 2017-09-22 DIAGNOSIS — K5909 Other constipation: Secondary | ICD-10-CM | POA: Diagnosis not present

## 2017-09-22 DIAGNOSIS — K5904 Chronic idiopathic constipation: Secondary | ICD-10-CM | POA: Diagnosis not present

## 2017-09-22 DIAGNOSIS — Z6823 Body mass index (BMI) 23.0-23.9, adult: Secondary | ICD-10-CM | POA: Diagnosis not present

## 2017-09-22 DIAGNOSIS — N401 Enlarged prostate with lower urinary tract symptoms: Secondary | ICD-10-CM | POA: Diagnosis not present

## 2017-09-23 DIAGNOSIS — N390 Urinary tract infection, site not specified: Secondary | ICD-10-CM | POA: Diagnosis not present

## 2017-09-23 DIAGNOSIS — D649 Anemia, unspecified: Secondary | ICD-10-CM | POA: Diagnosis not present

## 2017-09-23 DIAGNOSIS — B952 Enterococcus as the cause of diseases classified elsewhere: Secondary | ICD-10-CM | POA: Diagnosis not present

## 2017-09-23 DIAGNOSIS — N401 Enlarged prostate with lower urinary tract symptoms: Secondary | ICD-10-CM | POA: Diagnosis not present

## 2017-09-23 DIAGNOSIS — E119 Type 2 diabetes mellitus without complications: Secondary | ICD-10-CM | POA: Diagnosis not present

## 2017-09-23 DIAGNOSIS — F209 Schizophrenia, unspecified: Secondary | ICD-10-CM | POA: Diagnosis not present

## 2017-09-26 DIAGNOSIS — N401 Enlarged prostate with lower urinary tract symptoms: Secondary | ICD-10-CM | POA: Diagnosis not present

## 2017-09-26 DIAGNOSIS — N390 Urinary tract infection, site not specified: Secondary | ICD-10-CM | POA: Diagnosis not present

## 2017-09-26 DIAGNOSIS — E119 Type 2 diabetes mellitus without complications: Secondary | ICD-10-CM | POA: Diagnosis not present

## 2017-09-26 DIAGNOSIS — B952 Enterococcus as the cause of diseases classified elsewhere: Secondary | ICD-10-CM | POA: Diagnosis not present

## 2017-09-26 DIAGNOSIS — D649 Anemia, unspecified: Secondary | ICD-10-CM | POA: Diagnosis not present

## 2017-09-26 DIAGNOSIS — F209 Schizophrenia, unspecified: Secondary | ICD-10-CM | POA: Diagnosis not present

## 2017-09-27 DIAGNOSIS — B952 Enterococcus as the cause of diseases classified elsewhere: Secondary | ICD-10-CM | POA: Diagnosis not present

## 2017-09-27 DIAGNOSIS — D649 Anemia, unspecified: Secondary | ICD-10-CM | POA: Diagnosis not present

## 2017-09-27 DIAGNOSIS — N390 Urinary tract infection, site not specified: Secondary | ICD-10-CM | POA: Diagnosis not present

## 2017-09-27 DIAGNOSIS — N401 Enlarged prostate with lower urinary tract symptoms: Secondary | ICD-10-CM | POA: Diagnosis not present

## 2017-09-27 DIAGNOSIS — F209 Schizophrenia, unspecified: Secondary | ICD-10-CM | POA: Diagnosis not present

## 2017-09-27 DIAGNOSIS — E119 Type 2 diabetes mellitus without complications: Secondary | ICD-10-CM | POA: Diagnosis not present

## 2017-09-28 DIAGNOSIS — N390 Urinary tract infection, site not specified: Secondary | ICD-10-CM | POA: Diagnosis not present

## 2017-09-28 DIAGNOSIS — N401 Enlarged prostate with lower urinary tract symptoms: Secondary | ICD-10-CM | POA: Diagnosis not present

## 2017-09-28 DIAGNOSIS — E119 Type 2 diabetes mellitus without complications: Secondary | ICD-10-CM | POA: Diagnosis not present

## 2017-09-28 DIAGNOSIS — B952 Enterococcus as the cause of diseases classified elsewhere: Secondary | ICD-10-CM | POA: Diagnosis not present

## 2017-09-28 DIAGNOSIS — D649 Anemia, unspecified: Secondary | ICD-10-CM | POA: Diagnosis not present

## 2017-09-28 DIAGNOSIS — F209 Schizophrenia, unspecified: Secondary | ICD-10-CM | POA: Diagnosis not present

## 2017-09-30 DIAGNOSIS — N390 Urinary tract infection, site not specified: Secondary | ICD-10-CM | POA: Diagnosis not present

## 2017-09-30 DIAGNOSIS — N471 Phimosis: Secondary | ICD-10-CM | POA: Diagnosis not present

## 2017-09-30 DIAGNOSIS — R338 Other retention of urine: Secondary | ICD-10-CM | POA: Diagnosis not present

## 2017-09-30 DIAGNOSIS — N401 Enlarged prostate with lower urinary tract symptoms: Secondary | ICD-10-CM | POA: Diagnosis not present

## 2017-09-30 DIAGNOSIS — R3914 Feeling of incomplete bladder emptying: Secondary | ICD-10-CM | POA: Diagnosis not present

## 2017-10-03 DIAGNOSIS — B952 Enterococcus as the cause of diseases classified elsewhere: Secondary | ICD-10-CM | POA: Diagnosis not present

## 2017-10-03 DIAGNOSIS — D649 Anemia, unspecified: Secondary | ICD-10-CM | POA: Diagnosis not present

## 2017-10-03 DIAGNOSIS — E119 Type 2 diabetes mellitus without complications: Secondary | ICD-10-CM | POA: Diagnosis not present

## 2017-10-03 DIAGNOSIS — N390 Urinary tract infection, site not specified: Secondary | ICD-10-CM | POA: Diagnosis not present

## 2017-10-03 DIAGNOSIS — F209 Schizophrenia, unspecified: Secondary | ICD-10-CM | POA: Diagnosis not present

## 2017-10-03 DIAGNOSIS — N401 Enlarged prostate with lower urinary tract symptoms: Secondary | ICD-10-CM | POA: Diagnosis not present

## 2017-10-04 DIAGNOSIS — N390 Urinary tract infection, site not specified: Secondary | ICD-10-CM | POA: Diagnosis not present

## 2017-10-04 DIAGNOSIS — D649 Anemia, unspecified: Secondary | ICD-10-CM | POA: Diagnosis not present

## 2017-10-04 DIAGNOSIS — N401 Enlarged prostate with lower urinary tract symptoms: Secondary | ICD-10-CM | POA: Diagnosis not present

## 2017-10-04 DIAGNOSIS — F209 Schizophrenia, unspecified: Secondary | ICD-10-CM | POA: Diagnosis not present

## 2017-10-04 DIAGNOSIS — B952 Enterococcus as the cause of diseases classified elsewhere: Secondary | ICD-10-CM | POA: Diagnosis not present

## 2017-10-04 DIAGNOSIS — E119 Type 2 diabetes mellitus without complications: Secondary | ICD-10-CM | POA: Diagnosis not present

## 2017-10-05 DIAGNOSIS — B952 Enterococcus as the cause of diseases classified elsewhere: Secondary | ICD-10-CM | POA: Diagnosis not present

## 2017-10-05 DIAGNOSIS — N390 Urinary tract infection, site not specified: Secondary | ICD-10-CM | POA: Diagnosis not present

## 2017-10-05 DIAGNOSIS — E119 Type 2 diabetes mellitus without complications: Secondary | ICD-10-CM | POA: Diagnosis not present

## 2017-10-05 DIAGNOSIS — F209 Schizophrenia, unspecified: Secondary | ICD-10-CM | POA: Diagnosis not present

## 2017-10-05 DIAGNOSIS — D649 Anemia, unspecified: Secondary | ICD-10-CM | POA: Diagnosis not present

## 2017-10-05 DIAGNOSIS — N401 Enlarged prostate with lower urinary tract symptoms: Secondary | ICD-10-CM | POA: Diagnosis not present

## 2017-10-06 DIAGNOSIS — B952 Enterococcus as the cause of diseases classified elsewhere: Secondary | ICD-10-CM | POA: Diagnosis not present

## 2017-10-06 DIAGNOSIS — F209 Schizophrenia, unspecified: Secondary | ICD-10-CM | POA: Diagnosis not present

## 2017-10-06 DIAGNOSIS — N390 Urinary tract infection, site not specified: Secondary | ICD-10-CM | POA: Diagnosis not present

## 2017-10-06 DIAGNOSIS — N401 Enlarged prostate with lower urinary tract symptoms: Secondary | ICD-10-CM | POA: Diagnosis not present

## 2017-10-06 DIAGNOSIS — D649 Anemia, unspecified: Secondary | ICD-10-CM | POA: Diagnosis not present

## 2017-10-06 DIAGNOSIS — E119 Type 2 diabetes mellitus without complications: Secondary | ICD-10-CM | POA: Diagnosis not present

## 2017-10-07 DIAGNOSIS — E119 Type 2 diabetes mellitus without complications: Secondary | ICD-10-CM | POA: Diagnosis not present

## 2017-10-07 DIAGNOSIS — D649 Anemia, unspecified: Secondary | ICD-10-CM | POA: Diagnosis not present

## 2017-10-07 DIAGNOSIS — B952 Enterococcus as the cause of diseases classified elsewhere: Secondary | ICD-10-CM | POA: Diagnosis not present

## 2017-10-07 DIAGNOSIS — N401 Enlarged prostate with lower urinary tract symptoms: Secondary | ICD-10-CM | POA: Diagnosis not present

## 2017-10-07 DIAGNOSIS — F209 Schizophrenia, unspecified: Secondary | ICD-10-CM | POA: Diagnosis not present

## 2017-10-07 DIAGNOSIS — N390 Urinary tract infection, site not specified: Secondary | ICD-10-CM | POA: Diagnosis not present

## 2017-10-10 DIAGNOSIS — D649 Anemia, unspecified: Secondary | ICD-10-CM | POA: Diagnosis not present

## 2017-10-10 DIAGNOSIS — E119 Type 2 diabetes mellitus without complications: Secondary | ICD-10-CM | POA: Diagnosis not present

## 2017-10-10 DIAGNOSIS — N401 Enlarged prostate with lower urinary tract symptoms: Secondary | ICD-10-CM | POA: Diagnosis not present

## 2017-10-10 DIAGNOSIS — B952 Enterococcus as the cause of diseases classified elsewhere: Secondary | ICD-10-CM | POA: Diagnosis not present

## 2017-10-10 DIAGNOSIS — N390 Urinary tract infection, site not specified: Secondary | ICD-10-CM | POA: Diagnosis not present

## 2017-10-10 DIAGNOSIS — F209 Schizophrenia, unspecified: Secondary | ICD-10-CM | POA: Diagnosis not present

## 2017-10-11 DIAGNOSIS — E119 Type 2 diabetes mellitus without complications: Secondary | ICD-10-CM | POA: Diagnosis not present

## 2017-10-11 DIAGNOSIS — B952 Enterococcus as the cause of diseases classified elsewhere: Secondary | ICD-10-CM | POA: Diagnosis not present

## 2017-10-11 DIAGNOSIS — N401 Enlarged prostate with lower urinary tract symptoms: Secondary | ICD-10-CM | POA: Diagnosis not present

## 2017-10-11 DIAGNOSIS — D649 Anemia, unspecified: Secondary | ICD-10-CM | POA: Diagnosis not present

## 2017-10-11 DIAGNOSIS — F209 Schizophrenia, unspecified: Secondary | ICD-10-CM | POA: Diagnosis not present

## 2017-10-11 DIAGNOSIS — N390 Urinary tract infection, site not specified: Secondary | ICD-10-CM | POA: Diagnosis not present

## 2017-10-12 DIAGNOSIS — F209 Schizophrenia, unspecified: Secondary | ICD-10-CM | POA: Diagnosis not present

## 2017-10-12 DIAGNOSIS — E119 Type 2 diabetes mellitus without complications: Secondary | ICD-10-CM | POA: Diagnosis not present

## 2017-10-12 DIAGNOSIS — N401 Enlarged prostate with lower urinary tract symptoms: Secondary | ICD-10-CM | POA: Diagnosis not present

## 2017-10-12 DIAGNOSIS — D649 Anemia, unspecified: Secondary | ICD-10-CM | POA: Diagnosis not present

## 2017-10-12 DIAGNOSIS — N390 Urinary tract infection, site not specified: Secondary | ICD-10-CM | POA: Diagnosis not present

## 2017-10-12 DIAGNOSIS — B952 Enterococcus as the cause of diseases classified elsewhere: Secondary | ICD-10-CM | POA: Diagnosis not present

## 2017-10-13 DIAGNOSIS — B952 Enterococcus as the cause of diseases classified elsewhere: Secondary | ICD-10-CM | POA: Diagnosis not present

## 2017-10-13 DIAGNOSIS — N401 Enlarged prostate with lower urinary tract symptoms: Secondary | ICD-10-CM | POA: Diagnosis not present

## 2017-10-13 DIAGNOSIS — E119 Type 2 diabetes mellitus without complications: Secondary | ICD-10-CM | POA: Diagnosis not present

## 2017-10-13 DIAGNOSIS — N390 Urinary tract infection, site not specified: Secondary | ICD-10-CM | POA: Diagnosis not present

## 2017-10-13 DIAGNOSIS — F209 Schizophrenia, unspecified: Secondary | ICD-10-CM | POA: Diagnosis not present

## 2017-10-13 DIAGNOSIS — D649 Anemia, unspecified: Secondary | ICD-10-CM | POA: Diagnosis not present

## 2017-10-14 DIAGNOSIS — F209 Schizophrenia, unspecified: Secondary | ICD-10-CM | POA: Diagnosis not present

## 2017-10-14 DIAGNOSIS — N390 Urinary tract infection, site not specified: Secondary | ICD-10-CM | POA: Diagnosis not present

## 2017-10-14 DIAGNOSIS — N401 Enlarged prostate with lower urinary tract symptoms: Secondary | ICD-10-CM | POA: Diagnosis not present

## 2017-10-14 DIAGNOSIS — B952 Enterococcus as the cause of diseases classified elsewhere: Secondary | ICD-10-CM | POA: Diagnosis not present

## 2017-10-14 DIAGNOSIS — E119 Type 2 diabetes mellitus without complications: Secondary | ICD-10-CM | POA: Diagnosis not present

## 2017-10-14 DIAGNOSIS — D649 Anemia, unspecified: Secondary | ICD-10-CM | POA: Diagnosis not present

## 2017-10-17 DIAGNOSIS — N390 Urinary tract infection, site not specified: Secondary | ICD-10-CM | POA: Diagnosis not present

## 2017-10-17 DIAGNOSIS — D649 Anemia, unspecified: Secondary | ICD-10-CM | POA: Diagnosis not present

## 2017-10-17 DIAGNOSIS — E119 Type 2 diabetes mellitus without complications: Secondary | ICD-10-CM | POA: Diagnosis not present

## 2017-10-17 DIAGNOSIS — B952 Enterococcus as the cause of diseases classified elsewhere: Secondary | ICD-10-CM | POA: Diagnosis not present

## 2017-10-17 DIAGNOSIS — N401 Enlarged prostate with lower urinary tract symptoms: Secondary | ICD-10-CM | POA: Diagnosis not present

## 2017-10-17 DIAGNOSIS — F209 Schizophrenia, unspecified: Secondary | ICD-10-CM | POA: Diagnosis not present

## 2017-10-18 DIAGNOSIS — K5900 Constipation, unspecified: Secondary | ICD-10-CM | POA: Diagnosis not present

## 2017-10-19 DIAGNOSIS — N390 Urinary tract infection, site not specified: Secondary | ICD-10-CM | POA: Diagnosis not present

## 2017-10-19 DIAGNOSIS — E119 Type 2 diabetes mellitus without complications: Secondary | ICD-10-CM | POA: Diagnosis not present

## 2017-10-19 DIAGNOSIS — B952 Enterococcus as the cause of diseases classified elsewhere: Secondary | ICD-10-CM | POA: Diagnosis not present

## 2017-10-19 DIAGNOSIS — F209 Schizophrenia, unspecified: Secondary | ICD-10-CM | POA: Diagnosis not present

## 2017-10-19 DIAGNOSIS — N401 Enlarged prostate with lower urinary tract symptoms: Secondary | ICD-10-CM | POA: Diagnosis not present

## 2017-10-19 DIAGNOSIS — D649 Anemia, unspecified: Secondary | ICD-10-CM | POA: Diagnosis not present

## 2017-10-21 DIAGNOSIS — B952 Enterococcus as the cause of diseases classified elsewhere: Secondary | ICD-10-CM | POA: Diagnosis not present

## 2017-10-21 DIAGNOSIS — E119 Type 2 diabetes mellitus without complications: Secondary | ICD-10-CM | POA: Diagnosis not present

## 2017-10-21 DIAGNOSIS — F209 Schizophrenia, unspecified: Secondary | ICD-10-CM | POA: Diagnosis not present

## 2017-10-21 DIAGNOSIS — D649 Anemia, unspecified: Secondary | ICD-10-CM | POA: Diagnosis not present

## 2017-10-21 DIAGNOSIS — N390 Urinary tract infection, site not specified: Secondary | ICD-10-CM | POA: Diagnosis not present

## 2017-10-21 DIAGNOSIS — N401 Enlarged prostate with lower urinary tract symptoms: Secondary | ICD-10-CM | POA: Diagnosis not present

## 2017-10-24 DIAGNOSIS — N401 Enlarged prostate with lower urinary tract symptoms: Secondary | ICD-10-CM | POA: Diagnosis not present

## 2017-10-24 DIAGNOSIS — B354 Tinea corporis: Secondary | ICD-10-CM | POA: Diagnosis not present

## 2017-10-24 DIAGNOSIS — D649 Anemia, unspecified: Secondary | ICD-10-CM | POA: Diagnosis not present

## 2017-10-24 DIAGNOSIS — F209 Schizophrenia, unspecified: Secondary | ICD-10-CM | POA: Diagnosis not present

## 2017-10-24 DIAGNOSIS — N390 Urinary tract infection, site not specified: Secondary | ICD-10-CM | POA: Diagnosis not present

## 2017-10-24 DIAGNOSIS — B952 Enterococcus as the cause of diseases classified elsewhere: Secondary | ICD-10-CM | POA: Diagnosis not present

## 2017-10-24 DIAGNOSIS — E119 Type 2 diabetes mellitus without complications: Secondary | ICD-10-CM | POA: Diagnosis not present

## 2017-10-26 DIAGNOSIS — B952 Enterococcus as the cause of diseases classified elsewhere: Secondary | ICD-10-CM | POA: Diagnosis not present

## 2017-10-26 DIAGNOSIS — D649 Anemia, unspecified: Secondary | ICD-10-CM | POA: Diagnosis not present

## 2017-10-26 DIAGNOSIS — E119 Type 2 diabetes mellitus without complications: Secondary | ICD-10-CM | POA: Diagnosis not present

## 2017-10-26 DIAGNOSIS — F209 Schizophrenia, unspecified: Secondary | ICD-10-CM | POA: Diagnosis not present

## 2017-10-26 DIAGNOSIS — N390 Urinary tract infection, site not specified: Secondary | ICD-10-CM | POA: Diagnosis not present

## 2017-10-26 DIAGNOSIS — N401 Enlarged prostate with lower urinary tract symptoms: Secondary | ICD-10-CM | POA: Diagnosis not present

## 2017-10-27 DIAGNOSIS — D649 Anemia, unspecified: Secondary | ICD-10-CM | POA: Diagnosis not present

## 2017-10-27 DIAGNOSIS — E119 Type 2 diabetes mellitus without complications: Secondary | ICD-10-CM | POA: Diagnosis not present

## 2017-10-27 DIAGNOSIS — N390 Urinary tract infection, site not specified: Secondary | ICD-10-CM | POA: Diagnosis not present

## 2017-10-27 DIAGNOSIS — N401 Enlarged prostate with lower urinary tract symptoms: Secondary | ICD-10-CM | POA: Diagnosis not present

## 2017-10-27 DIAGNOSIS — B952 Enterococcus as the cause of diseases classified elsewhere: Secondary | ICD-10-CM | POA: Diagnosis not present

## 2017-10-27 DIAGNOSIS — F209 Schizophrenia, unspecified: Secondary | ICD-10-CM | POA: Diagnosis not present

## 2017-10-28 DIAGNOSIS — N390 Urinary tract infection, site not specified: Secondary | ICD-10-CM | POA: Diagnosis not present

## 2017-10-28 DIAGNOSIS — E119 Type 2 diabetes mellitus without complications: Secondary | ICD-10-CM | POA: Diagnosis not present

## 2017-10-28 DIAGNOSIS — F209 Schizophrenia, unspecified: Secondary | ICD-10-CM | POA: Diagnosis not present

## 2017-10-28 DIAGNOSIS — N401 Enlarged prostate with lower urinary tract symptoms: Secondary | ICD-10-CM | POA: Diagnosis not present

## 2017-10-28 DIAGNOSIS — D649 Anemia, unspecified: Secondary | ICD-10-CM | POA: Diagnosis not present

## 2017-10-28 DIAGNOSIS — B952 Enterococcus as the cause of diseases classified elsewhere: Secondary | ICD-10-CM | POA: Diagnosis not present

## 2017-10-31 DIAGNOSIS — B952 Enterococcus as the cause of diseases classified elsewhere: Secondary | ICD-10-CM | POA: Diagnosis not present

## 2017-10-31 DIAGNOSIS — N390 Urinary tract infection, site not specified: Secondary | ICD-10-CM | POA: Diagnosis not present

## 2017-10-31 DIAGNOSIS — E119 Type 2 diabetes mellitus without complications: Secondary | ICD-10-CM | POA: Diagnosis not present

## 2017-10-31 DIAGNOSIS — F209 Schizophrenia, unspecified: Secondary | ICD-10-CM | POA: Diagnosis not present

## 2017-10-31 DIAGNOSIS — D649 Anemia, unspecified: Secondary | ICD-10-CM | POA: Diagnosis not present

## 2017-10-31 DIAGNOSIS — N401 Enlarged prostate with lower urinary tract symptoms: Secondary | ICD-10-CM | POA: Diagnosis not present

## 2017-11-02 DIAGNOSIS — B952 Enterococcus as the cause of diseases classified elsewhere: Secondary | ICD-10-CM | POA: Diagnosis not present

## 2017-11-02 DIAGNOSIS — F209 Schizophrenia, unspecified: Secondary | ICD-10-CM | POA: Diagnosis not present

## 2017-11-02 DIAGNOSIS — E119 Type 2 diabetes mellitus without complications: Secondary | ICD-10-CM | POA: Diagnosis not present

## 2017-11-02 DIAGNOSIS — N390 Urinary tract infection, site not specified: Secondary | ICD-10-CM | POA: Diagnosis not present

## 2017-11-02 DIAGNOSIS — N401 Enlarged prostate with lower urinary tract symptoms: Secondary | ICD-10-CM | POA: Diagnosis not present

## 2017-11-02 DIAGNOSIS — D649 Anemia, unspecified: Secondary | ICD-10-CM | POA: Diagnosis not present

## 2017-11-03 DIAGNOSIS — F209 Schizophrenia, unspecified: Secondary | ICD-10-CM | POA: Diagnosis not present

## 2017-11-03 DIAGNOSIS — N401 Enlarged prostate with lower urinary tract symptoms: Secondary | ICD-10-CM | POA: Diagnosis not present

## 2017-11-03 DIAGNOSIS — N390 Urinary tract infection, site not specified: Secondary | ICD-10-CM | POA: Diagnosis not present

## 2017-11-03 DIAGNOSIS — B952 Enterococcus as the cause of diseases classified elsewhere: Secondary | ICD-10-CM | POA: Diagnosis not present

## 2017-11-03 DIAGNOSIS — E119 Type 2 diabetes mellitus without complications: Secondary | ICD-10-CM | POA: Diagnosis not present

## 2017-11-03 DIAGNOSIS — D649 Anemia, unspecified: Secondary | ICD-10-CM | POA: Diagnosis not present

## 2017-11-04 DIAGNOSIS — E119 Type 2 diabetes mellitus without complications: Secondary | ICD-10-CM | POA: Diagnosis not present

## 2017-11-04 DIAGNOSIS — B952 Enterococcus as the cause of diseases classified elsewhere: Secondary | ICD-10-CM | POA: Diagnosis not present

## 2017-11-04 DIAGNOSIS — D649 Anemia, unspecified: Secondary | ICD-10-CM | POA: Diagnosis not present

## 2017-11-04 DIAGNOSIS — F209 Schizophrenia, unspecified: Secondary | ICD-10-CM | POA: Diagnosis not present

## 2017-11-04 DIAGNOSIS — N390 Urinary tract infection, site not specified: Secondary | ICD-10-CM | POA: Diagnosis not present

## 2017-11-04 DIAGNOSIS — N401 Enlarged prostate with lower urinary tract symptoms: Secondary | ICD-10-CM | POA: Diagnosis not present

## 2017-11-07 DIAGNOSIS — B952 Enterococcus as the cause of diseases classified elsewhere: Secondary | ICD-10-CM | POA: Diagnosis not present

## 2017-11-07 DIAGNOSIS — N401 Enlarged prostate with lower urinary tract symptoms: Secondary | ICD-10-CM | POA: Diagnosis not present

## 2017-11-07 DIAGNOSIS — F209 Schizophrenia, unspecified: Secondary | ICD-10-CM | POA: Diagnosis not present

## 2017-11-07 DIAGNOSIS — D649 Anemia, unspecified: Secondary | ICD-10-CM | POA: Diagnosis not present

## 2017-11-07 DIAGNOSIS — N390 Urinary tract infection, site not specified: Secondary | ICD-10-CM | POA: Diagnosis not present

## 2017-11-07 DIAGNOSIS — E119 Type 2 diabetes mellitus without complications: Secondary | ICD-10-CM | POA: Diagnosis not present

## 2017-11-09 DIAGNOSIS — B952 Enterococcus as the cause of diseases classified elsewhere: Secondary | ICD-10-CM | POA: Diagnosis not present

## 2017-11-09 DIAGNOSIS — N390 Urinary tract infection, site not specified: Secondary | ICD-10-CM | POA: Diagnosis not present

## 2017-11-09 DIAGNOSIS — E119 Type 2 diabetes mellitus without complications: Secondary | ICD-10-CM | POA: Diagnosis not present

## 2017-11-09 DIAGNOSIS — F209 Schizophrenia, unspecified: Secondary | ICD-10-CM | POA: Diagnosis not present

## 2017-11-09 DIAGNOSIS — D649 Anemia, unspecified: Secondary | ICD-10-CM | POA: Diagnosis not present

## 2017-11-09 DIAGNOSIS — N401 Enlarged prostate with lower urinary tract symptoms: Secondary | ICD-10-CM | POA: Diagnosis not present

## 2017-11-10 DIAGNOSIS — B952 Enterococcus as the cause of diseases classified elsewhere: Secondary | ICD-10-CM | POA: Diagnosis not present

## 2017-11-10 DIAGNOSIS — E119 Type 2 diabetes mellitus without complications: Secondary | ICD-10-CM | POA: Diagnosis not present

## 2017-11-10 DIAGNOSIS — D649 Anemia, unspecified: Secondary | ICD-10-CM | POA: Diagnosis not present

## 2017-11-10 DIAGNOSIS — F209 Schizophrenia, unspecified: Secondary | ICD-10-CM | POA: Diagnosis not present

## 2017-11-10 DIAGNOSIS — N401 Enlarged prostate with lower urinary tract symptoms: Secondary | ICD-10-CM | POA: Diagnosis not present

## 2017-11-10 DIAGNOSIS — N390 Urinary tract infection, site not specified: Secondary | ICD-10-CM | POA: Diagnosis not present

## 2017-11-14 DIAGNOSIS — F209 Schizophrenia, unspecified: Secondary | ICD-10-CM | POA: Diagnosis not present

## 2017-11-14 DIAGNOSIS — D649 Anemia, unspecified: Secondary | ICD-10-CM | POA: Diagnosis not present

## 2017-11-14 DIAGNOSIS — B952 Enterococcus as the cause of diseases classified elsewhere: Secondary | ICD-10-CM | POA: Diagnosis not present

## 2017-11-14 DIAGNOSIS — E119 Type 2 diabetes mellitus without complications: Secondary | ICD-10-CM | POA: Diagnosis not present

## 2017-11-14 DIAGNOSIS — N401 Enlarged prostate with lower urinary tract symptoms: Secondary | ICD-10-CM | POA: Diagnosis not present

## 2017-11-14 DIAGNOSIS — N390 Urinary tract infection, site not specified: Secondary | ICD-10-CM | POA: Diagnosis not present

## 2017-11-15 DIAGNOSIS — B952 Enterococcus as the cause of diseases classified elsewhere: Secondary | ICD-10-CM | POA: Diagnosis not present

## 2017-11-15 DIAGNOSIS — E119 Type 2 diabetes mellitus without complications: Secondary | ICD-10-CM | POA: Diagnosis not present

## 2017-11-15 DIAGNOSIS — N390 Urinary tract infection, site not specified: Secondary | ICD-10-CM | POA: Diagnosis not present

## 2017-11-15 DIAGNOSIS — N401 Enlarged prostate with lower urinary tract symptoms: Secondary | ICD-10-CM | POA: Diagnosis not present

## 2017-11-15 DIAGNOSIS — D649 Anemia, unspecified: Secondary | ICD-10-CM | POA: Diagnosis not present

## 2017-11-15 DIAGNOSIS — F209 Schizophrenia, unspecified: Secondary | ICD-10-CM | POA: Diagnosis not present

## 2017-11-16 DIAGNOSIS — D649 Anemia, unspecified: Secondary | ICD-10-CM | POA: Diagnosis not present

## 2017-11-16 DIAGNOSIS — N401 Enlarged prostate with lower urinary tract symptoms: Secondary | ICD-10-CM | POA: Diagnosis not present

## 2017-11-16 DIAGNOSIS — F209 Schizophrenia, unspecified: Secondary | ICD-10-CM | POA: Diagnosis not present

## 2017-11-16 DIAGNOSIS — B952 Enterococcus as the cause of diseases classified elsewhere: Secondary | ICD-10-CM | POA: Diagnosis not present

## 2017-11-16 DIAGNOSIS — N390 Urinary tract infection, site not specified: Secondary | ICD-10-CM | POA: Diagnosis not present

## 2017-11-16 DIAGNOSIS — E119 Type 2 diabetes mellitus without complications: Secondary | ICD-10-CM | POA: Diagnosis not present

## 2017-11-17 DIAGNOSIS — E119 Type 2 diabetes mellitus without complications: Secondary | ICD-10-CM | POA: Diagnosis not present

## 2017-11-17 DIAGNOSIS — D649 Anemia, unspecified: Secondary | ICD-10-CM | POA: Diagnosis not present

## 2017-11-17 DIAGNOSIS — B952 Enterococcus as the cause of diseases classified elsewhere: Secondary | ICD-10-CM | POA: Diagnosis not present

## 2017-11-17 DIAGNOSIS — N390 Urinary tract infection, site not specified: Secondary | ICD-10-CM | POA: Diagnosis not present

## 2017-11-17 DIAGNOSIS — N401 Enlarged prostate with lower urinary tract symptoms: Secondary | ICD-10-CM | POA: Diagnosis not present

## 2017-11-17 DIAGNOSIS — F209 Schizophrenia, unspecified: Secondary | ICD-10-CM | POA: Diagnosis not present

## 2017-11-18 DIAGNOSIS — E119 Type 2 diabetes mellitus without complications: Secondary | ICD-10-CM | POA: Diagnosis not present

## 2017-11-18 DIAGNOSIS — B952 Enterococcus as the cause of diseases classified elsewhere: Secondary | ICD-10-CM | POA: Diagnosis not present

## 2017-11-18 DIAGNOSIS — D649 Anemia, unspecified: Secondary | ICD-10-CM | POA: Diagnosis not present

## 2017-11-18 DIAGNOSIS — F209 Schizophrenia, unspecified: Secondary | ICD-10-CM | POA: Diagnosis not present

## 2017-11-18 DIAGNOSIS — N401 Enlarged prostate with lower urinary tract symptoms: Secondary | ICD-10-CM | POA: Diagnosis not present

## 2017-11-18 DIAGNOSIS — N390 Urinary tract infection, site not specified: Secondary | ICD-10-CM | POA: Diagnosis not present

## 2017-11-19 DIAGNOSIS — R338 Other retention of urine: Secondary | ICD-10-CM | POA: Diagnosis not present

## 2017-11-19 DIAGNOSIS — N401 Enlarged prostate with lower urinary tract symptoms: Secondary | ICD-10-CM | POA: Diagnosis not present

## 2017-11-19 DIAGNOSIS — E119 Type 2 diabetes mellitus without complications: Secondary | ICD-10-CM | POA: Diagnosis not present

## 2017-11-19 DIAGNOSIS — F209 Schizophrenia, unspecified: Secondary | ICD-10-CM | POA: Diagnosis not present

## 2017-11-19 DIAGNOSIS — K5909 Other constipation: Secondary | ICD-10-CM | POA: Diagnosis not present

## 2017-11-19 DIAGNOSIS — D649 Anemia, unspecified: Secondary | ICD-10-CM | POA: Diagnosis not present

## 2017-11-21 DIAGNOSIS — R338 Other retention of urine: Secondary | ICD-10-CM | POA: Diagnosis not present

## 2017-11-21 DIAGNOSIS — E119 Type 2 diabetes mellitus without complications: Secondary | ICD-10-CM | POA: Diagnosis not present

## 2017-11-21 DIAGNOSIS — N401 Enlarged prostate with lower urinary tract symptoms: Secondary | ICD-10-CM | POA: Diagnosis not present

## 2017-11-21 DIAGNOSIS — D649 Anemia, unspecified: Secondary | ICD-10-CM | POA: Diagnosis not present

## 2017-11-21 DIAGNOSIS — K5909 Other constipation: Secondary | ICD-10-CM | POA: Diagnosis not present

## 2017-11-21 DIAGNOSIS — F209 Schizophrenia, unspecified: Secondary | ICD-10-CM | POA: Diagnosis not present

## 2017-11-23 DIAGNOSIS — N401 Enlarged prostate with lower urinary tract symptoms: Secondary | ICD-10-CM | POA: Diagnosis not present

## 2017-11-23 DIAGNOSIS — K5909 Other constipation: Secondary | ICD-10-CM | POA: Diagnosis not present

## 2017-11-23 DIAGNOSIS — F209 Schizophrenia, unspecified: Secondary | ICD-10-CM | POA: Diagnosis not present

## 2017-11-23 DIAGNOSIS — R338 Other retention of urine: Secondary | ICD-10-CM | POA: Diagnosis not present

## 2017-11-23 DIAGNOSIS — D649 Anemia, unspecified: Secondary | ICD-10-CM | POA: Diagnosis not present

## 2017-11-23 DIAGNOSIS — E119 Type 2 diabetes mellitus without complications: Secondary | ICD-10-CM | POA: Diagnosis not present

## 2017-11-24 DIAGNOSIS — E119 Type 2 diabetes mellitus without complications: Secondary | ICD-10-CM | POA: Diagnosis not present

## 2017-11-24 DIAGNOSIS — F209 Schizophrenia, unspecified: Secondary | ICD-10-CM | POA: Diagnosis not present

## 2017-11-24 DIAGNOSIS — R338 Other retention of urine: Secondary | ICD-10-CM | POA: Diagnosis not present

## 2017-11-24 DIAGNOSIS — D649 Anemia, unspecified: Secondary | ICD-10-CM | POA: Diagnosis not present

## 2017-11-24 DIAGNOSIS — N401 Enlarged prostate with lower urinary tract symptoms: Secondary | ICD-10-CM | POA: Diagnosis not present

## 2017-11-24 DIAGNOSIS — K5909 Other constipation: Secondary | ICD-10-CM | POA: Diagnosis not present

## 2017-11-28 DIAGNOSIS — E119 Type 2 diabetes mellitus without complications: Secondary | ICD-10-CM | POA: Diagnosis not present

## 2017-11-28 DIAGNOSIS — N401 Enlarged prostate with lower urinary tract symptoms: Secondary | ICD-10-CM | POA: Diagnosis not present

## 2017-11-28 DIAGNOSIS — R338 Other retention of urine: Secondary | ICD-10-CM | POA: Diagnosis not present

## 2017-11-28 DIAGNOSIS — F209 Schizophrenia, unspecified: Secondary | ICD-10-CM | POA: Diagnosis not present

## 2017-11-28 DIAGNOSIS — D649 Anemia, unspecified: Secondary | ICD-10-CM | POA: Diagnosis not present

## 2017-11-28 DIAGNOSIS — K5909 Other constipation: Secondary | ICD-10-CM | POA: Diagnosis not present

## 2017-11-30 DIAGNOSIS — N401 Enlarged prostate with lower urinary tract symptoms: Secondary | ICD-10-CM | POA: Diagnosis not present

## 2017-11-30 DIAGNOSIS — K5909 Other constipation: Secondary | ICD-10-CM | POA: Diagnosis not present

## 2017-11-30 DIAGNOSIS — F209 Schizophrenia, unspecified: Secondary | ICD-10-CM | POA: Diagnosis not present

## 2017-11-30 DIAGNOSIS — E119 Type 2 diabetes mellitus without complications: Secondary | ICD-10-CM | POA: Diagnosis not present

## 2017-11-30 DIAGNOSIS — D649 Anemia, unspecified: Secondary | ICD-10-CM | POA: Diagnosis not present

## 2017-11-30 DIAGNOSIS — R338 Other retention of urine: Secondary | ICD-10-CM | POA: Diagnosis not present

## 2017-12-01 DIAGNOSIS — F209 Schizophrenia, unspecified: Secondary | ICD-10-CM | POA: Diagnosis not present

## 2017-12-01 DIAGNOSIS — D649 Anemia, unspecified: Secondary | ICD-10-CM | POA: Diagnosis not present

## 2017-12-01 DIAGNOSIS — K5909 Other constipation: Secondary | ICD-10-CM | POA: Diagnosis not present

## 2017-12-01 DIAGNOSIS — E119 Type 2 diabetes mellitus without complications: Secondary | ICD-10-CM | POA: Diagnosis not present

## 2017-12-01 DIAGNOSIS — N401 Enlarged prostate with lower urinary tract symptoms: Secondary | ICD-10-CM | POA: Diagnosis not present

## 2017-12-01 DIAGNOSIS — R338 Other retention of urine: Secondary | ICD-10-CM | POA: Diagnosis not present

## 2017-12-05 DIAGNOSIS — K5909 Other constipation: Secondary | ICD-10-CM | POA: Diagnosis not present

## 2017-12-05 DIAGNOSIS — F209 Schizophrenia, unspecified: Secondary | ICD-10-CM | POA: Diagnosis not present

## 2017-12-05 DIAGNOSIS — R338 Other retention of urine: Secondary | ICD-10-CM | POA: Diagnosis not present

## 2017-12-05 DIAGNOSIS — G20 Parkinson's disease: Secondary | ICD-10-CM | POA: Diagnosis not present

## 2017-12-05 DIAGNOSIS — N401 Enlarged prostate with lower urinary tract symptoms: Secondary | ICD-10-CM | POA: Diagnosis not present

## 2017-12-05 DIAGNOSIS — E119 Type 2 diabetes mellitus without complications: Secondary | ICD-10-CM | POA: Diagnosis not present

## 2017-12-05 DIAGNOSIS — D649 Anemia, unspecified: Secondary | ICD-10-CM | POA: Diagnosis not present

## 2017-12-05 DIAGNOSIS — G2401 Drug induced subacute dyskinesia: Secondary | ICD-10-CM | POA: Diagnosis not present

## 2017-12-07 DIAGNOSIS — F209 Schizophrenia, unspecified: Secondary | ICD-10-CM | POA: Diagnosis not present

## 2017-12-07 DIAGNOSIS — D649 Anemia, unspecified: Secondary | ICD-10-CM | POA: Diagnosis not present

## 2017-12-07 DIAGNOSIS — E119 Type 2 diabetes mellitus without complications: Secondary | ICD-10-CM | POA: Diagnosis not present

## 2017-12-07 DIAGNOSIS — R338 Other retention of urine: Secondary | ICD-10-CM | POA: Diagnosis not present

## 2017-12-07 DIAGNOSIS — N401 Enlarged prostate with lower urinary tract symptoms: Secondary | ICD-10-CM | POA: Diagnosis not present

## 2017-12-07 DIAGNOSIS — K5909 Other constipation: Secondary | ICD-10-CM | POA: Diagnosis not present

## 2017-12-08 DIAGNOSIS — R338 Other retention of urine: Secondary | ICD-10-CM | POA: Diagnosis not present

## 2017-12-08 DIAGNOSIS — E119 Type 2 diabetes mellitus without complications: Secondary | ICD-10-CM | POA: Diagnosis not present

## 2017-12-08 DIAGNOSIS — N401 Enlarged prostate with lower urinary tract symptoms: Secondary | ICD-10-CM | POA: Diagnosis not present

## 2017-12-08 DIAGNOSIS — F209 Schizophrenia, unspecified: Secondary | ICD-10-CM | POA: Diagnosis not present

## 2017-12-08 DIAGNOSIS — D649 Anemia, unspecified: Secondary | ICD-10-CM | POA: Diagnosis not present

## 2017-12-08 DIAGNOSIS — K5909 Other constipation: Secondary | ICD-10-CM | POA: Diagnosis not present

## 2017-12-09 DIAGNOSIS — Z6823 Body mass index (BMI) 23.0-23.9, adult: Secondary | ICD-10-CM | POA: Diagnosis not present

## 2017-12-09 DIAGNOSIS — R159 Full incontinence of feces: Secondary | ICD-10-CM | POA: Diagnosis not present

## 2017-12-12 DIAGNOSIS — K5909 Other constipation: Secondary | ICD-10-CM | POA: Diagnosis not present

## 2017-12-12 DIAGNOSIS — E119 Type 2 diabetes mellitus without complications: Secondary | ICD-10-CM | POA: Diagnosis not present

## 2017-12-12 DIAGNOSIS — N401 Enlarged prostate with lower urinary tract symptoms: Secondary | ICD-10-CM | POA: Diagnosis not present

## 2017-12-12 DIAGNOSIS — R338 Other retention of urine: Secondary | ICD-10-CM | POA: Diagnosis not present

## 2017-12-12 DIAGNOSIS — F209 Schizophrenia, unspecified: Secondary | ICD-10-CM | POA: Diagnosis not present

## 2017-12-12 DIAGNOSIS — D649 Anemia, unspecified: Secondary | ICD-10-CM | POA: Diagnosis not present

## 2017-12-15 DIAGNOSIS — D649 Anemia, unspecified: Secondary | ICD-10-CM | POA: Diagnosis not present

## 2017-12-15 DIAGNOSIS — E119 Type 2 diabetes mellitus without complications: Secondary | ICD-10-CM | POA: Diagnosis not present

## 2017-12-15 DIAGNOSIS — N401 Enlarged prostate with lower urinary tract symptoms: Secondary | ICD-10-CM | POA: Diagnosis not present

## 2017-12-15 DIAGNOSIS — F209 Schizophrenia, unspecified: Secondary | ICD-10-CM | POA: Diagnosis not present

## 2017-12-15 DIAGNOSIS — R338 Other retention of urine: Secondary | ICD-10-CM | POA: Diagnosis not present

## 2017-12-15 DIAGNOSIS — K5909 Other constipation: Secondary | ICD-10-CM | POA: Diagnosis not present

## 2017-12-20 DIAGNOSIS — D649 Anemia, unspecified: Secondary | ICD-10-CM | POA: Diagnosis not present

## 2017-12-20 DIAGNOSIS — E119 Type 2 diabetes mellitus without complications: Secondary | ICD-10-CM | POA: Diagnosis not present

## 2017-12-20 DIAGNOSIS — F209 Schizophrenia, unspecified: Secondary | ICD-10-CM | POA: Diagnosis not present

## 2017-12-20 DIAGNOSIS — K5909 Other constipation: Secondary | ICD-10-CM | POA: Diagnosis not present

## 2017-12-20 DIAGNOSIS — N401 Enlarged prostate with lower urinary tract symptoms: Secondary | ICD-10-CM | POA: Diagnosis not present

## 2017-12-20 DIAGNOSIS — R338 Other retention of urine: Secondary | ICD-10-CM | POA: Diagnosis not present

## 2017-12-21 DIAGNOSIS — N401 Enlarged prostate with lower urinary tract symptoms: Secondary | ICD-10-CM | POA: Diagnosis not present

## 2017-12-21 DIAGNOSIS — E119 Type 2 diabetes mellitus without complications: Secondary | ICD-10-CM | POA: Diagnosis not present

## 2017-12-21 DIAGNOSIS — R338 Other retention of urine: Secondary | ICD-10-CM | POA: Diagnosis not present

## 2017-12-21 DIAGNOSIS — K5909 Other constipation: Secondary | ICD-10-CM | POA: Diagnosis not present

## 2017-12-21 DIAGNOSIS — D649 Anemia, unspecified: Secondary | ICD-10-CM | POA: Diagnosis not present

## 2017-12-21 DIAGNOSIS — F209 Schizophrenia, unspecified: Secondary | ICD-10-CM | POA: Diagnosis not present

## 2017-12-22 DIAGNOSIS — E119 Type 2 diabetes mellitus without complications: Secondary | ICD-10-CM | POA: Diagnosis not present

## 2017-12-22 DIAGNOSIS — R338 Other retention of urine: Secondary | ICD-10-CM | POA: Diagnosis not present

## 2017-12-22 DIAGNOSIS — D649 Anemia, unspecified: Secondary | ICD-10-CM | POA: Diagnosis not present

## 2017-12-22 DIAGNOSIS — N401 Enlarged prostate with lower urinary tract symptoms: Secondary | ICD-10-CM | POA: Diagnosis not present

## 2017-12-22 DIAGNOSIS — K5909 Other constipation: Secondary | ICD-10-CM | POA: Diagnosis not present

## 2017-12-22 DIAGNOSIS — F209 Schizophrenia, unspecified: Secondary | ICD-10-CM | POA: Diagnosis not present

## 2017-12-23 DIAGNOSIS — E119 Type 2 diabetes mellitus without complications: Secondary | ICD-10-CM | POA: Diagnosis not present

## 2017-12-23 DIAGNOSIS — N401 Enlarged prostate with lower urinary tract symptoms: Secondary | ICD-10-CM | POA: Diagnosis not present

## 2017-12-23 DIAGNOSIS — D649 Anemia, unspecified: Secondary | ICD-10-CM | POA: Diagnosis not present

## 2017-12-23 DIAGNOSIS — R338 Other retention of urine: Secondary | ICD-10-CM | POA: Diagnosis not present

## 2017-12-23 DIAGNOSIS — F209 Schizophrenia, unspecified: Secondary | ICD-10-CM | POA: Diagnosis not present

## 2017-12-23 DIAGNOSIS — K5909 Other constipation: Secondary | ICD-10-CM | POA: Diagnosis not present

## 2017-12-26 DIAGNOSIS — E119 Type 2 diabetes mellitus without complications: Secondary | ICD-10-CM | POA: Diagnosis not present

## 2017-12-26 DIAGNOSIS — F209 Schizophrenia, unspecified: Secondary | ICD-10-CM | POA: Diagnosis not present

## 2017-12-26 DIAGNOSIS — R338 Other retention of urine: Secondary | ICD-10-CM | POA: Diagnosis not present

## 2017-12-26 DIAGNOSIS — K5909 Other constipation: Secondary | ICD-10-CM | POA: Diagnosis not present

## 2017-12-26 DIAGNOSIS — N401 Enlarged prostate with lower urinary tract symptoms: Secondary | ICD-10-CM | POA: Diagnosis not present

## 2017-12-26 DIAGNOSIS — D649 Anemia, unspecified: Secondary | ICD-10-CM | POA: Diagnosis not present

## 2017-12-28 DIAGNOSIS — R338 Other retention of urine: Secondary | ICD-10-CM | POA: Diagnosis not present

## 2017-12-28 DIAGNOSIS — R350 Frequency of micturition: Secondary | ICD-10-CM | POA: Diagnosis not present

## 2017-12-28 DIAGNOSIS — D649 Anemia, unspecified: Secondary | ICD-10-CM | POA: Diagnosis not present

## 2017-12-28 DIAGNOSIS — E119 Type 2 diabetes mellitus without complications: Secondary | ICD-10-CM | POA: Diagnosis not present

## 2017-12-28 DIAGNOSIS — F209 Schizophrenia, unspecified: Secondary | ICD-10-CM | POA: Diagnosis not present

## 2017-12-28 DIAGNOSIS — N401 Enlarged prostate with lower urinary tract symptoms: Secondary | ICD-10-CM | POA: Diagnosis not present

## 2017-12-28 DIAGNOSIS — K5909 Other constipation: Secondary | ICD-10-CM | POA: Diagnosis not present

## 2017-12-28 DIAGNOSIS — R3914 Feeling of incomplete bladder emptying: Secondary | ICD-10-CM | POA: Diagnosis not present

## 2017-12-28 DIAGNOSIS — N3020 Other chronic cystitis without hematuria: Secondary | ICD-10-CM | POA: Diagnosis not present

## 2017-12-29 DIAGNOSIS — R338 Other retention of urine: Secondary | ICD-10-CM | POA: Diagnosis not present

## 2017-12-29 DIAGNOSIS — K5909 Other constipation: Secondary | ICD-10-CM | POA: Diagnosis not present

## 2017-12-29 DIAGNOSIS — E119 Type 2 diabetes mellitus without complications: Secondary | ICD-10-CM | POA: Diagnosis not present

## 2017-12-29 DIAGNOSIS — D649 Anemia, unspecified: Secondary | ICD-10-CM | POA: Diagnosis not present

## 2017-12-29 DIAGNOSIS — N401 Enlarged prostate with lower urinary tract symptoms: Secondary | ICD-10-CM | POA: Diagnosis not present

## 2017-12-29 DIAGNOSIS — F209 Schizophrenia, unspecified: Secondary | ICD-10-CM | POA: Diagnosis not present

## 2018-01-02 DIAGNOSIS — E119 Type 2 diabetes mellitus without complications: Secondary | ICD-10-CM | POA: Diagnosis not present

## 2018-01-02 DIAGNOSIS — D649 Anemia, unspecified: Secondary | ICD-10-CM | POA: Diagnosis not present

## 2018-01-02 DIAGNOSIS — R338 Other retention of urine: Secondary | ICD-10-CM | POA: Diagnosis not present

## 2018-01-02 DIAGNOSIS — F209 Schizophrenia, unspecified: Secondary | ICD-10-CM | POA: Diagnosis not present

## 2018-01-02 DIAGNOSIS — N401 Enlarged prostate with lower urinary tract symptoms: Secondary | ICD-10-CM | POA: Diagnosis not present

## 2018-01-02 DIAGNOSIS — K5909 Other constipation: Secondary | ICD-10-CM | POA: Diagnosis not present

## 2018-01-04 DIAGNOSIS — F209 Schizophrenia, unspecified: Secondary | ICD-10-CM | POA: Diagnosis not present

## 2018-01-04 DIAGNOSIS — N401 Enlarged prostate with lower urinary tract symptoms: Secondary | ICD-10-CM | POA: Diagnosis not present

## 2018-01-04 DIAGNOSIS — R338 Other retention of urine: Secondary | ICD-10-CM | POA: Diagnosis not present

## 2018-01-04 DIAGNOSIS — K5909 Other constipation: Secondary | ICD-10-CM | POA: Diagnosis not present

## 2018-01-04 DIAGNOSIS — D649 Anemia, unspecified: Secondary | ICD-10-CM | POA: Diagnosis not present

## 2018-01-04 DIAGNOSIS — E119 Type 2 diabetes mellitus without complications: Secondary | ICD-10-CM | POA: Diagnosis not present

## 2018-01-05 DIAGNOSIS — N401 Enlarged prostate with lower urinary tract symptoms: Secondary | ICD-10-CM | POA: Diagnosis not present

## 2018-01-05 DIAGNOSIS — D649 Anemia, unspecified: Secondary | ICD-10-CM | POA: Diagnosis not present

## 2018-01-05 DIAGNOSIS — E119 Type 2 diabetes mellitus without complications: Secondary | ICD-10-CM | POA: Diagnosis not present

## 2018-01-05 DIAGNOSIS — F209 Schizophrenia, unspecified: Secondary | ICD-10-CM | POA: Diagnosis not present

## 2018-01-05 DIAGNOSIS — K5909 Other constipation: Secondary | ICD-10-CM | POA: Diagnosis not present

## 2018-01-05 DIAGNOSIS — R338 Other retention of urine: Secondary | ICD-10-CM | POA: Diagnosis not present

## 2018-01-09 DIAGNOSIS — G5601 Carpal tunnel syndrome, right upper limb: Secondary | ICD-10-CM | POA: Diagnosis not present

## 2018-01-09 DIAGNOSIS — M21519 Acquired clawhand, unspecified hand: Secondary | ICD-10-CM | POA: Diagnosis not present

## 2018-01-09 DIAGNOSIS — F209 Schizophrenia, unspecified: Secondary | ICD-10-CM | POA: Diagnosis not present

## 2018-01-09 DIAGNOSIS — Z6822 Body mass index (BMI) 22.0-22.9, adult: Secondary | ICD-10-CM | POA: Diagnosis not present

## 2018-01-13 DIAGNOSIS — F209 Schizophrenia, unspecified: Secondary | ICD-10-CM | POA: Diagnosis not present

## 2018-01-13 DIAGNOSIS — D649 Anemia, unspecified: Secondary | ICD-10-CM | POA: Diagnosis not present

## 2018-01-13 DIAGNOSIS — K5909 Other constipation: Secondary | ICD-10-CM | POA: Diagnosis not present

## 2018-01-13 DIAGNOSIS — R338 Other retention of urine: Secondary | ICD-10-CM | POA: Diagnosis not present

## 2018-01-13 DIAGNOSIS — N401 Enlarged prostate with lower urinary tract symptoms: Secondary | ICD-10-CM | POA: Diagnosis not present

## 2018-01-13 DIAGNOSIS — E119 Type 2 diabetes mellitus without complications: Secondary | ICD-10-CM | POA: Diagnosis not present

## 2018-01-18 DIAGNOSIS — D649 Anemia, unspecified: Secondary | ICD-10-CM | POA: Diagnosis not present

## 2018-01-18 DIAGNOSIS — F209 Schizophrenia, unspecified: Secondary | ICD-10-CM | POA: Diagnosis not present

## 2018-01-18 DIAGNOSIS — M62542 Muscle wasting and atrophy, not elsewhere classified, left hand: Secondary | ICD-10-CM | POA: Diagnosis not present

## 2018-01-18 DIAGNOSIS — E119 Type 2 diabetes mellitus without complications: Secondary | ICD-10-CM | POA: Diagnosis not present

## 2018-01-18 DIAGNOSIS — M62441 Contracture of muscle, right hand: Secondary | ICD-10-CM | POA: Diagnosis not present

## 2018-01-18 DIAGNOSIS — K5909 Other constipation: Secondary | ICD-10-CM | POA: Diagnosis not present

## 2018-01-18 DIAGNOSIS — R338 Other retention of urine: Secondary | ICD-10-CM | POA: Diagnosis not present

## 2018-01-18 DIAGNOSIS — M62541 Muscle wasting and atrophy, not elsewhere classified, right hand: Secondary | ICD-10-CM | POA: Diagnosis not present

## 2018-01-18 DIAGNOSIS — M62442 Contracture of muscle, left hand: Secondary | ICD-10-CM | POA: Diagnosis not present

## 2018-01-18 DIAGNOSIS — N401 Enlarged prostate with lower urinary tract symptoms: Secondary | ICD-10-CM | POA: Diagnosis not present

## 2018-01-25 DIAGNOSIS — F209 Schizophrenia, unspecified: Secondary | ICD-10-CM | POA: Diagnosis not present

## 2018-01-25 DIAGNOSIS — D649 Anemia, unspecified: Secondary | ICD-10-CM | POA: Diagnosis not present

## 2018-01-25 DIAGNOSIS — K5909 Other constipation: Secondary | ICD-10-CM | POA: Diagnosis not present

## 2018-01-25 DIAGNOSIS — R338 Other retention of urine: Secondary | ICD-10-CM | POA: Diagnosis not present

## 2018-01-25 DIAGNOSIS — E119 Type 2 diabetes mellitus without complications: Secondary | ICD-10-CM | POA: Diagnosis not present

## 2018-01-25 DIAGNOSIS — N401 Enlarged prostate with lower urinary tract symptoms: Secondary | ICD-10-CM | POA: Diagnosis not present

## 2018-01-27 DIAGNOSIS — R9389 Abnormal findings on diagnostic imaging of other specified body structures: Secondary | ICD-10-CM | POA: Diagnosis not present

## 2018-01-27 DIAGNOSIS — K5900 Constipation, unspecified: Secondary | ICD-10-CM | POA: Diagnosis not present

## 2018-01-27 DIAGNOSIS — R140 Abdominal distension (gaseous): Secondary | ICD-10-CM | POA: Diagnosis not present

## 2018-01-27 DIAGNOSIS — R933 Abnormal findings on diagnostic imaging of other parts of digestive tract: Secondary | ICD-10-CM | POA: Diagnosis not present

## 2018-02-14 DIAGNOSIS — H6123 Impacted cerumen, bilateral: Secondary | ICD-10-CM | POA: Diagnosis not present

## 2018-02-14 DIAGNOSIS — L821 Other seborrheic keratosis: Secondary | ICD-10-CM | POA: Diagnosis not present

## 2018-02-14 DIAGNOSIS — C44309 Unspecified malignant neoplasm of skin of other parts of face: Secondary | ICD-10-CM | POA: Diagnosis not present

## 2018-02-17 DIAGNOSIS — D649 Anemia, unspecified: Secondary | ICD-10-CM | POA: Diagnosis not present

## 2018-02-17 DIAGNOSIS — E119 Type 2 diabetes mellitus without complications: Secondary | ICD-10-CM | POA: Diagnosis not present

## 2018-02-17 DIAGNOSIS — N401 Enlarged prostate with lower urinary tract symptoms: Secondary | ICD-10-CM | POA: Diagnosis not present

## 2018-02-17 DIAGNOSIS — R338 Other retention of urine: Secondary | ICD-10-CM | POA: Diagnosis not present

## 2018-02-17 DIAGNOSIS — F209 Schizophrenia, unspecified: Secondary | ICD-10-CM | POA: Diagnosis not present

## 2018-02-17 DIAGNOSIS — K5909 Other constipation: Secondary | ICD-10-CM | POA: Diagnosis not present

## 2018-03-02 DIAGNOSIS — D649 Anemia, unspecified: Secondary | ICD-10-CM | POA: Diagnosis not present

## 2018-03-02 DIAGNOSIS — F209 Schizophrenia, unspecified: Secondary | ICD-10-CM | POA: Diagnosis not present

## 2018-03-02 DIAGNOSIS — K5909 Other constipation: Secondary | ICD-10-CM | POA: Diagnosis not present

## 2018-03-02 DIAGNOSIS — N401 Enlarged prostate with lower urinary tract symptoms: Secondary | ICD-10-CM | POA: Diagnosis not present

## 2018-03-02 DIAGNOSIS — E119 Type 2 diabetes mellitus without complications: Secondary | ICD-10-CM | POA: Diagnosis not present

## 2018-03-02 DIAGNOSIS — R338 Other retention of urine: Secondary | ICD-10-CM | POA: Diagnosis not present

## 2018-03-06 DIAGNOSIS — K5909 Other constipation: Secondary | ICD-10-CM | POA: Diagnosis not present

## 2018-03-06 DIAGNOSIS — E119 Type 2 diabetes mellitus without complications: Secondary | ICD-10-CM | POA: Diagnosis not present

## 2018-03-06 DIAGNOSIS — N401 Enlarged prostate with lower urinary tract symptoms: Secondary | ICD-10-CM | POA: Diagnosis not present

## 2018-03-06 DIAGNOSIS — D649 Anemia, unspecified: Secondary | ICD-10-CM | POA: Diagnosis not present

## 2018-03-06 DIAGNOSIS — R338 Other retention of urine: Secondary | ICD-10-CM | POA: Diagnosis not present

## 2018-03-06 DIAGNOSIS — F209 Schizophrenia, unspecified: Secondary | ICD-10-CM | POA: Diagnosis not present

## 2018-03-08 DIAGNOSIS — K5909 Other constipation: Secondary | ICD-10-CM | POA: Diagnosis not present

## 2018-03-08 DIAGNOSIS — E119 Type 2 diabetes mellitus without complications: Secondary | ICD-10-CM | POA: Diagnosis not present

## 2018-03-08 DIAGNOSIS — F209 Schizophrenia, unspecified: Secondary | ICD-10-CM | POA: Diagnosis not present

## 2018-03-08 DIAGNOSIS — R338 Other retention of urine: Secondary | ICD-10-CM | POA: Diagnosis not present

## 2018-03-08 DIAGNOSIS — D649 Anemia, unspecified: Secondary | ICD-10-CM | POA: Diagnosis not present

## 2018-03-08 DIAGNOSIS — N401 Enlarged prostate with lower urinary tract symptoms: Secondary | ICD-10-CM | POA: Diagnosis not present

## 2018-03-10 DIAGNOSIS — E119 Type 2 diabetes mellitus without complications: Secondary | ICD-10-CM | POA: Diagnosis not present

## 2018-03-10 DIAGNOSIS — F209 Schizophrenia, unspecified: Secondary | ICD-10-CM | POA: Diagnosis not present

## 2018-03-10 DIAGNOSIS — N401 Enlarged prostate with lower urinary tract symptoms: Secondary | ICD-10-CM | POA: Diagnosis not present

## 2018-03-10 DIAGNOSIS — K5909 Other constipation: Secondary | ICD-10-CM | POA: Diagnosis not present

## 2018-03-10 DIAGNOSIS — R338 Other retention of urine: Secondary | ICD-10-CM | POA: Diagnosis not present

## 2018-03-10 DIAGNOSIS — D649 Anemia, unspecified: Secondary | ICD-10-CM | POA: Diagnosis not present

## 2018-03-15 DIAGNOSIS — F209 Schizophrenia, unspecified: Secondary | ICD-10-CM | POA: Diagnosis not present

## 2018-03-15 DIAGNOSIS — B354 Tinea corporis: Secondary | ICD-10-CM | POA: Diagnosis not present

## 2018-03-15 DIAGNOSIS — K589 Irritable bowel syndrome without diarrhea: Secondary | ICD-10-CM | POA: Diagnosis not present

## 2018-03-28 DIAGNOSIS — R2681 Unsteadiness on feet: Secondary | ICD-10-CM | POA: Diagnosis not present

## 2018-03-28 DIAGNOSIS — M6281 Muscle weakness (generalized): Secondary | ICD-10-CM | POA: Diagnosis not present

## 2018-05-03 DIAGNOSIS — R109 Unspecified abdominal pain: Secondary | ICD-10-CM | POA: Diagnosis not present

## 2018-05-18 DIAGNOSIS — R338 Other retention of urine: Secondary | ICD-10-CM | POA: Diagnosis not present

## 2018-05-18 DIAGNOSIS — F79 Unspecified intellectual disabilities: Secondary | ICD-10-CM | POA: Diagnosis not present

## 2018-05-18 DIAGNOSIS — E119 Type 2 diabetes mellitus without complications: Secondary | ICD-10-CM | POA: Diagnosis not present

## 2018-05-18 DIAGNOSIS — Z466 Encounter for fitting and adjustment of urinary device: Secondary | ICD-10-CM | POA: Diagnosis not present

## 2018-05-18 DIAGNOSIS — Z87440 Personal history of urinary (tract) infections: Secondary | ICD-10-CM | POA: Diagnosis not present

## 2018-05-18 DIAGNOSIS — G1221 Amyotrophic lateral sclerosis: Secondary | ICD-10-CM | POA: Diagnosis not present

## 2018-05-18 DIAGNOSIS — Z7982 Long term (current) use of aspirin: Secondary | ICD-10-CM | POA: Diagnosis not present

## 2018-05-18 DIAGNOSIS — F209 Schizophrenia, unspecified: Secondary | ICD-10-CM | POA: Diagnosis not present

## 2018-05-18 DIAGNOSIS — N401 Enlarged prostate with lower urinary tract symptoms: Secondary | ICD-10-CM | POA: Diagnosis not present

## 2018-05-18 DIAGNOSIS — Z9181 History of falling: Secondary | ICD-10-CM | POA: Diagnosis not present

## 2018-05-29 DIAGNOSIS — B3749 Other urogenital candidiasis: Secondary | ICD-10-CM | POA: Diagnosis not present

## 2018-06-05 DIAGNOSIS — Z23 Encounter for immunization: Secondary | ICD-10-CM | POA: Diagnosis not present

## 2018-06-14 DIAGNOSIS — R338 Other retention of urine: Secondary | ICD-10-CM | POA: Diagnosis not present

## 2018-06-14 DIAGNOSIS — E119 Type 2 diabetes mellitus without complications: Secondary | ICD-10-CM | POA: Diagnosis not present

## 2018-06-14 DIAGNOSIS — Z466 Encounter for fitting and adjustment of urinary device: Secondary | ICD-10-CM | POA: Diagnosis not present

## 2018-06-14 DIAGNOSIS — G1221 Amyotrophic lateral sclerosis: Secondary | ICD-10-CM | POA: Diagnosis not present

## 2018-06-14 DIAGNOSIS — F209 Schizophrenia, unspecified: Secondary | ICD-10-CM | POA: Diagnosis not present

## 2018-06-14 DIAGNOSIS — N401 Enlarged prostate with lower urinary tract symptoms: Secondary | ICD-10-CM | POA: Diagnosis not present

## 2018-06-19 DIAGNOSIS — K5904 Chronic idiopathic constipation: Secondary | ICD-10-CM | POA: Diagnosis not present

## 2018-06-26 DIAGNOSIS — R194 Change in bowel habit: Secondary | ICD-10-CM | POA: Diagnosis not present

## 2018-06-26 DIAGNOSIS — R140 Abdominal distension (gaseous): Secondary | ICD-10-CM | POA: Diagnosis not present

## 2018-06-30 DIAGNOSIS — N401 Enlarged prostate with lower urinary tract symptoms: Secondary | ICD-10-CM | POA: Diagnosis not present

## 2018-06-30 DIAGNOSIS — R3914 Feeling of incomplete bladder emptying: Secondary | ICD-10-CM | POA: Diagnosis not present

## 2018-07-12 DIAGNOSIS — N39490 Overflow incontinence: Secondary | ICD-10-CM | POA: Diagnosis not present

## 2018-07-12 DIAGNOSIS — K5900 Constipation, unspecified: Secondary | ICD-10-CM | POA: Diagnosis not present

## 2018-07-12 DIAGNOSIS — R933 Abnormal findings on diagnostic imaging of other parts of digestive tract: Secondary | ICD-10-CM | POA: Diagnosis not present

## 2018-07-14 DIAGNOSIS — F209 Schizophrenia, unspecified: Secondary | ICD-10-CM | POA: Diagnosis not present

## 2018-07-14 DIAGNOSIS — Z466 Encounter for fitting and adjustment of urinary device: Secondary | ICD-10-CM | POA: Diagnosis not present

## 2018-07-14 DIAGNOSIS — N401 Enlarged prostate with lower urinary tract symptoms: Secondary | ICD-10-CM | POA: Diagnosis not present

## 2018-07-14 DIAGNOSIS — G1221 Amyotrophic lateral sclerosis: Secondary | ICD-10-CM | POA: Diagnosis not present

## 2018-07-14 DIAGNOSIS — R338 Other retention of urine: Secondary | ICD-10-CM | POA: Diagnosis not present

## 2018-07-14 DIAGNOSIS — E119 Type 2 diabetes mellitus without complications: Secondary | ICD-10-CM | POA: Diagnosis not present

## 2020-09-23 DEATH — deceased
